# Patient Record
Sex: Male | Born: 1975 | Race: White | Hispanic: No | Smoking: Former smoker
Health system: Southern US, Community
[De-identification: ages and names within clinical notes are randomized; demographics above are authoritative.]

## PROBLEM LIST (undated history)

## (undated) DIAGNOSIS — F411 Generalized anxiety disorder: Secondary | ICD-10-CM

## (undated) DIAGNOSIS — E785 Hyperlipidemia, unspecified: Secondary | ICD-10-CM

## (undated) DIAGNOSIS — F329 Major depressive disorder, single episode, unspecified: Secondary | ICD-10-CM

## (undated) DIAGNOSIS — M1990 Unspecified osteoarthritis, unspecified site: Secondary | ICD-10-CM

## (undated) DIAGNOSIS — I10 Essential (primary) hypertension: Secondary | ICD-10-CM

## (undated) DIAGNOSIS — G4730 Sleep apnea, unspecified: Secondary | ICD-10-CM

## (undated) DIAGNOSIS — K648 Other hemorrhoids: Secondary | ICD-10-CM

## (undated) HISTORY — DX: Major depressive disorder, single episode, unspecified: F32.9

## (undated) HISTORY — PX: INGUINAL HERNIA REPAIR: SUR1180

## (undated) HISTORY — DX: Hyperlipidemia, unspecified: E78.5

## (undated) HISTORY — DX: Other hemorrhoids: K64.8

## (undated) HISTORY — PX: UVULOPALATOPHARYNGOPLASTY (UPPP)/TONSILLECTOMY/SEPTOPLASTY: SHX6164

## (undated) HISTORY — DX: Essential (primary) hypertension: I10

## (undated) HISTORY — PX: NASAL SEPTUM SURGERY: SHX37

## (undated) HISTORY — DX: Sleep apnea, unspecified: G47.30

## (undated) HISTORY — PX: TONSILLECTOMY: SUR1361

## (undated) HISTORY — DX: Unspecified osteoarthritis, unspecified site: M19.90

## (undated) HISTORY — DX: Generalized anxiety disorder: F41.1

## (undated) HISTORY — PX: OTHER SURGICAL HISTORY: SHX169

---

## 2002-11-08 ENCOUNTER — Ambulatory Visit (HOSPITAL_BASED_OUTPATIENT_CLINIC_OR_DEPARTMENT_OTHER): Admission: RE | Admit: 2002-11-08 | Discharge: 2002-11-08 | Payer: Self-pay | Admitting: General Surgery

## 2005-03-15 ENCOUNTER — Ambulatory Visit: Payer: Self-pay | Admitting: Internal Medicine

## 2005-04-15 ENCOUNTER — Ambulatory Visit: Payer: Self-pay | Admitting: Internal Medicine

## 2005-05-20 ENCOUNTER — Ambulatory Visit: Payer: Self-pay | Admitting: Internal Medicine

## 2005-07-24 ENCOUNTER — Emergency Department (HOSPITAL_COMMUNITY): Admission: EM | Admit: 2005-07-24 | Discharge: 2005-07-25 | Payer: Self-pay | Admitting: Emergency Medicine

## 2005-08-01 ENCOUNTER — Ambulatory Visit: Payer: Self-pay | Admitting: Internal Medicine

## 2006-03-04 ENCOUNTER — Emergency Department (HOSPITAL_COMMUNITY): Admission: EM | Admit: 2006-03-04 | Discharge: 2006-03-04 | Payer: Self-pay | Admitting: Emergency Medicine

## 2006-03-13 ENCOUNTER — Ambulatory Visit: Payer: Self-pay | Admitting: Internal Medicine

## 2006-03-20 ENCOUNTER — Ambulatory Visit: Payer: Self-pay | Admitting: Internal Medicine

## 2006-04-07 ENCOUNTER — Ambulatory Visit: Payer: Self-pay | Admitting: Internal Medicine

## 2006-04-21 ENCOUNTER — Ambulatory Visit: Payer: Self-pay | Admitting: Internal Medicine

## 2006-04-28 ENCOUNTER — Ambulatory Visit: Payer: Self-pay | Admitting: Internal Medicine

## 2006-11-06 ENCOUNTER — Ambulatory Visit: Payer: Self-pay | Admitting: Internal Medicine

## 2006-11-08 ENCOUNTER — Ambulatory Visit: Payer: Self-pay | Admitting: Internal Medicine

## 2007-01-23 ENCOUNTER — Ambulatory Visit: Payer: Self-pay | Admitting: Internal Medicine

## 2007-01-23 LAB — CONVERTED CEMR LAB
ALT: 47 units/L — ABNORMAL HIGH (ref 0–40)
AST: 27 units/L (ref 0–37)
Albumin: 4.2 g/dL (ref 3.5–5.2)
Alkaline Phosphatase: 56 units/L (ref 39–117)
BUN: 11 mg/dL (ref 6–23)
Basophils Absolute: 0 10*3/uL (ref 0.0–0.1)
Basophils Relative: 0.1 % (ref 0.0–1.0)
Bilirubin, Direct: 0.1 mg/dL (ref 0.0–0.3)
CO2: 29 meq/L (ref 19–32)
Calcium: 9.6 mg/dL (ref 8.4–10.5)
Chloride: 102 meq/L (ref 96–112)
Cholesterol: 244 mg/dL (ref 0–200)
Creatinine, Ser: 1.1 mg/dL (ref 0.4–1.5)
Direct LDL: 190 mg/dL
Eosinophils Absolute: 0.2 10*3/uL (ref 0.0–0.6)
Eosinophils Relative: 3.7 % (ref 0.0–5.0)
GFR calc Af Amer: 102 mL/min
GFR calc non Af Amer: 84 mL/min
Glucose, Bld: 77 mg/dL (ref 70–99)
HCT: 48.4 % (ref 39.0–52.0)
HDL: 32.7 mg/dL — ABNORMAL LOW (ref >39.0)
Hemoglobin: 16.8 g/dL (ref 13.0–17.0)
Lymphocytes Relative: 33.5 % (ref 12.0–46.0)
MCHC: 34.8 g/dL (ref 30.0–36.0)
MCV: 91.6 fL (ref 78.0–100.0)
Monocytes Absolute: 0.5 10*3/uL (ref 0.2–0.7)
Monocytes Relative: 9.5 % (ref 3.0–11.0)
Neutro Abs: 2.8 10*3/uL (ref 1.4–7.7)
Neutrophils Relative %: 53.2 % (ref 43.0–77.0)
Platelets: 209 10*3/uL (ref 150–400)
Potassium: 3.9 meq/L (ref 3.5–5.1)
RBC: 5.28 M/uL (ref 4.22–5.81)
RDW: 12.7 % (ref 11.5–14.6)
Sodium: 139 meq/L (ref 135–145)
TSH: 2.96 microintl units/mL (ref 0.35–5.50)
Total Bilirubin: 1.1 mg/dL (ref 0.3–1.2)
Total CHOL/HDL Ratio: 7.5
Total Protein: 7.9 g/dL (ref 6.0–8.3)
Triglycerides: 159 mg/dL — ABNORMAL HIGH (ref 0–149)
VLDL: 32 mg/dL (ref 0–40)
WBC: 5.2 10*3/uL (ref 4.5–10.5)

## 2007-02-05 ENCOUNTER — Ambulatory Visit: Payer: Self-pay | Admitting: Internal Medicine

## 2007-02-09 ENCOUNTER — Ambulatory Visit: Payer: Self-pay | Admitting: Licensed Clinical Social Worker

## 2007-02-13 ENCOUNTER — Ambulatory Visit: Payer: Self-pay | Admitting: Licensed Clinical Social Worker

## 2007-03-01 ENCOUNTER — Ambulatory Visit: Payer: Self-pay | Admitting: Licensed Clinical Social Worker

## 2007-03-29 ENCOUNTER — Ambulatory Visit: Payer: Self-pay | Admitting: Licensed Clinical Social Worker

## 2007-07-19 DIAGNOSIS — F329 Major depressive disorder, single episode, unspecified: Secondary | ICD-10-CM

## 2007-07-19 DIAGNOSIS — F3289 Other specified depressive episodes: Secondary | ICD-10-CM

## 2007-07-19 DIAGNOSIS — F411 Generalized anxiety disorder: Secondary | ICD-10-CM

## 2007-07-19 DIAGNOSIS — E785 Hyperlipidemia, unspecified: Secondary | ICD-10-CM | POA: Insufficient documentation

## 2007-07-19 DIAGNOSIS — M545 Low back pain, unspecified: Secondary | ICD-10-CM | POA: Insufficient documentation

## 2007-07-19 DIAGNOSIS — I10 Essential (primary) hypertension: Secondary | ICD-10-CM | POA: Insufficient documentation

## 2007-07-19 DIAGNOSIS — F419 Anxiety disorder, unspecified: Secondary | ICD-10-CM | POA: Insufficient documentation

## 2007-07-19 HISTORY — DX: Generalized anxiety disorder: F41.1

## 2007-07-19 HISTORY — DX: Other specified depressive episodes: F32.89

## 2007-07-19 HISTORY — DX: Essential (primary) hypertension: I10

## 2007-07-19 HISTORY — DX: Hyperlipidemia, unspecified: E78.5

## 2007-07-19 HISTORY — DX: Major depressive disorder, single episode, unspecified: F32.9

## 2007-10-03 ENCOUNTER — Ambulatory Visit: Payer: Self-pay | Admitting: Internal Medicine

## 2007-10-03 DIAGNOSIS — R319 Hematuria, unspecified: Secondary | ICD-10-CM | POA: Insufficient documentation

## 2007-10-03 LAB — CONVERTED CEMR LAB
Bilirubin Urine: NEGATIVE
Glucose, Urine, Semiquant: NEGATIVE
Ketones, urine, test strip: NEGATIVE
Nitrite: NEGATIVE
Protein, U semiquant: NEGATIVE
Specific Gravity, Urine: 1.02
Urobilinogen, UA: 0.2
WBC Urine, dipstick: NEGATIVE
pH: 7

## 2007-10-04 ENCOUNTER — Encounter: Payer: Self-pay | Admitting: Internal Medicine

## 2007-10-05 ENCOUNTER — Telehealth: Payer: Self-pay | Admitting: Internal Medicine

## 2007-10-10 ENCOUNTER — Telehealth: Payer: Self-pay | Admitting: Internal Medicine

## 2007-10-15 ENCOUNTER — Ambulatory Visit: Payer: Self-pay | Admitting: Internal Medicine

## 2007-10-15 DIAGNOSIS — H5310 Unspecified subjective visual disturbances: Secondary | ICD-10-CM | POA: Insufficient documentation

## 2007-10-16 ENCOUNTER — Telehealth: Payer: Self-pay | Admitting: Internal Medicine

## 2007-10-16 ENCOUNTER — Encounter: Payer: Self-pay | Admitting: Internal Medicine

## 2007-10-19 ENCOUNTER — Telehealth: Payer: Self-pay | Admitting: Internal Medicine

## 2007-10-24 ENCOUNTER — Ambulatory Visit: Payer: Self-pay | Admitting: Ophthalmology

## 2007-10-24 ENCOUNTER — Encounter: Payer: Self-pay | Admitting: Internal Medicine

## 2007-10-31 ENCOUNTER — Telehealth: Payer: Self-pay | Admitting: Internal Medicine

## 2007-11-09 ENCOUNTER — Encounter: Payer: Self-pay | Admitting: Internal Medicine

## 2007-11-16 ENCOUNTER — Emergency Department (HOSPITAL_COMMUNITY): Admission: EM | Admit: 2007-11-16 | Discharge: 2007-11-16 | Payer: Self-pay | Admitting: Emergency Medicine

## 2007-11-16 ENCOUNTER — Telehealth: Payer: Self-pay | Admitting: Internal Medicine

## 2007-11-23 ENCOUNTER — Ambulatory Visit (HOSPITAL_BASED_OUTPATIENT_CLINIC_OR_DEPARTMENT_OTHER): Admission: RE | Admit: 2007-11-23 | Discharge: 2007-11-23 | Payer: Self-pay | Admitting: Otolaryngology

## 2007-12-02 ENCOUNTER — Ambulatory Visit: Payer: Self-pay | Admitting: Internal Medicine

## 2007-12-07 ENCOUNTER — Telehealth: Payer: Self-pay | Admitting: Internal Medicine

## 2007-12-11 ENCOUNTER — Telehealth: Payer: Self-pay | Admitting: Internal Medicine

## 2007-12-13 ENCOUNTER — Telehealth: Payer: Self-pay | Admitting: Internal Medicine

## 2007-12-24 ENCOUNTER — Ambulatory Visit: Payer: Self-pay | Admitting: Internal Medicine

## 2007-12-24 ENCOUNTER — Ambulatory Visit: Payer: Self-pay | Admitting: Licensed Clinical Social Worker

## 2007-12-26 LAB — CONVERTED CEMR LAB
Basophils Absolute: 0 10*3/uL (ref 0.0–0.1)
Basophils Relative: 0.4 % (ref 0.0–1.0)
Eosinophils Absolute: 0.1 10*3/uL (ref 0.0–0.6)
Eosinophils Relative: 2.4 % (ref 0.0–5.0)
HCT: 49.6 % (ref 39.0–52.0)
Hemoglobin: 17.8 g/dL — ABNORMAL HIGH (ref 13.0–17.0)
Lymphocytes Relative: 33.1 % (ref 12.0–46.0)
MCHC: 35.9 g/dL (ref 30.0–36.0)
MCV: 90.6 fL (ref 78.0–100.0)
Monocytes Absolute: 0.5 10*3/uL (ref 0.2–0.7)
Monocytes Relative: 8.4 % (ref 3.0–11.0)
Neutro Abs: 3.2 10*3/uL (ref 1.4–7.7)
Neutrophils Relative %: 55.7 % (ref 43.0–77.0)
Platelets: 183 10*3/uL (ref 150–400)
RBC: 5.48 M/uL (ref 4.22–5.81)
RDW: 12.3 % (ref 11.5–14.6)
TSH: 3.96 microintl units/mL (ref 0.35–5.50)
WBC: 5.7 10*3/uL (ref 4.5–10.5)

## 2008-01-07 ENCOUNTER — Telehealth: Payer: Self-pay | Admitting: Internal Medicine

## 2008-01-10 ENCOUNTER — Telehealth: Payer: Self-pay | Admitting: Internal Medicine

## 2008-02-05 ENCOUNTER — Telehealth: Payer: Self-pay | Admitting: Internal Medicine

## 2008-02-18 ENCOUNTER — Ambulatory Visit: Payer: Self-pay | Admitting: Internal Medicine

## 2008-02-18 LAB — CONVERTED CEMR LAB
ALT: 50 units/L (ref 0–53)
AST: 23 units/L (ref 0–37)
Albumin: 4 g/dL (ref 3.5–5.2)
Alkaline Phosphatase: 63 units/L (ref 39–117)
BUN: 9 mg/dL (ref 6–23)
Basophils Absolute: 0 10*3/uL (ref 0.0–0.1)
Basophils Relative: 0.5 % (ref 0.0–1.0)
Bilirubin Urine: NEGATIVE
Bilirubin, Direct: 0.2 mg/dL (ref 0.0–0.3)
Blood in Urine, dipstick: NEGATIVE
CO2: 32 meq/L (ref 19–32)
Calcium: 9.3 mg/dL (ref 8.4–10.5)
Chloride: 103 meq/L (ref 96–112)
Cholesterol: 209 mg/dL (ref 0–200)
Creatinine, Ser: 1 mg/dL (ref 0.4–1.5)
Direct LDL: 117.9 mg/dL
Eosinophils Absolute: 0.2 10*3/uL (ref 0.0–0.6)
Eosinophils Relative: 3.5 % (ref 0.0–5.0)
GFR calc Af Amer: 113 mL/min
GFR calc non Af Amer: 93 mL/min
Glucose, Bld: 86 mg/dL (ref 70–99)
Glucose, Urine, Semiquant: NEGATIVE
HCT: 50.1 % (ref 39.0–52.0)
HDL: 26.4 mg/dL — ABNORMAL LOW (ref >39.0)
Hemoglobin: 16.8 g/dL (ref 13.0–17.0)
Ketones, urine, test strip: NEGATIVE
Lymphocytes Relative: 32.9 % (ref 12.0–46.0)
MCHC: 33.6 g/dL (ref 30.0–36.0)
MCV: 91.2 fL (ref 78.0–100.0)
Monocytes Absolute: 0.5 10*3/uL (ref 0.2–0.7)
Monocytes Relative: 9.2 % (ref 3.0–11.0)
Neutro Abs: 2.9 10*3/uL (ref 1.4–7.7)
Neutrophils Relative %: 53.9 % (ref 43.0–77.0)
Nitrite: NEGATIVE
Platelets: 169 10*3/uL (ref 150–400)
Potassium: 4.5 meq/L (ref 3.5–5.1)
Protein, U semiquant: NEGATIVE
RBC: 5.49 M/uL (ref 4.22–5.81)
RDW: 12.1 % (ref 11.5–14.6)
Sodium: 139 meq/L (ref 135–145)
Specific Gravity, Urine: 1.02
TSH: 3.9 microintl units/mL (ref 0.35–5.50)
Total Bilirubin: 0.9 mg/dL (ref 0.3–1.2)
Total CHOL/HDL Ratio: 7.9
Total Protein: 7.1 g/dL (ref 6.0–8.3)
Triglycerides: 262 mg/dL (ref 0–149)
Urobilinogen, UA: 0.2
VLDL: 52 mg/dL — ABNORMAL HIGH (ref 0–40)
WBC Urine, dipstick: NEGATIVE
WBC: 5.3 10*3/uL (ref 4.5–10.5)
pH: 7

## 2008-02-20 LAB — CONVERTED CEMR LAB

## 2008-03-03 ENCOUNTER — Ambulatory Visit: Payer: Self-pay | Admitting: Internal Medicine

## 2008-04-25 ENCOUNTER — Telehealth: Payer: Self-pay | Admitting: Internal Medicine

## 2008-06-19 ENCOUNTER — Telehealth: Payer: Self-pay | Admitting: Internal Medicine

## 2008-10-06 ENCOUNTER — Encounter: Payer: Self-pay | Admitting: Internal Medicine

## 2008-10-15 ENCOUNTER — Ambulatory Visit (HOSPITAL_COMMUNITY): Admission: RE | Admit: 2008-10-15 | Discharge: 2008-10-17 | Payer: Self-pay | Admitting: Otolaryngology

## 2008-10-15 ENCOUNTER — Encounter (INDEPENDENT_AMBULATORY_CARE_PROVIDER_SITE_OTHER): Payer: Self-pay | Admitting: Otolaryngology

## 2008-10-24 ENCOUNTER — Encounter: Payer: Self-pay | Admitting: Internal Medicine

## 2008-11-10 ENCOUNTER — Encounter: Payer: Self-pay | Admitting: Internal Medicine

## 2008-11-25 ENCOUNTER — Telehealth: Payer: Self-pay | Admitting: Internal Medicine

## 2008-12-01 ENCOUNTER — Ambulatory Visit: Payer: Self-pay | Admitting: Family Medicine

## 2008-12-01 DIAGNOSIS — K648 Other hemorrhoids: Secondary | ICD-10-CM | POA: Insufficient documentation

## 2008-12-01 HISTORY — DX: Other hemorrhoids: K64.8

## 2008-12-08 ENCOUNTER — Telehealth: Payer: Self-pay | Admitting: *Deleted

## 2008-12-24 ENCOUNTER — Telehealth: Payer: Self-pay | Admitting: Internal Medicine

## 2008-12-26 ENCOUNTER — Ambulatory Visit: Payer: Self-pay | Admitting: Internal Medicine

## 2008-12-26 DIAGNOSIS — J019 Acute sinusitis, unspecified: Secondary | ICD-10-CM | POA: Insufficient documentation

## 2009-01-05 ENCOUNTER — Telehealth: Payer: Self-pay | Admitting: Internal Medicine

## 2009-02-06 ENCOUNTER — Ambulatory Visit: Payer: Self-pay | Admitting: Family Medicine

## 2009-02-11 ENCOUNTER — Telehealth: Payer: Self-pay | Admitting: Speech Pathology

## 2009-02-24 ENCOUNTER — Telehealth: Payer: Self-pay | Admitting: Internal Medicine

## 2009-04-22 ENCOUNTER — Ambulatory Visit: Payer: Self-pay | Admitting: Family Medicine

## 2009-05-25 ENCOUNTER — Telehealth: Payer: Self-pay | Admitting: Family Medicine

## 2009-06-11 ENCOUNTER — Emergency Department (HOSPITAL_COMMUNITY): Admission: EM | Admit: 2009-06-11 | Discharge: 2009-06-11 | Payer: Self-pay | Admitting: Emergency Medicine

## 2010-05-07 ENCOUNTER — Ambulatory Visit: Payer: Self-pay | Admitting: Family Medicine

## 2010-05-07 DIAGNOSIS — A088 Other specified intestinal infections: Secondary | ICD-10-CM | POA: Insufficient documentation

## 2010-05-10 ENCOUNTER — Telehealth: Payer: Self-pay | Admitting: Internal Medicine

## 2010-06-23 ENCOUNTER — Telehealth: Payer: Self-pay | Admitting: Internal Medicine

## 2011-01-04 NOTE — Consult Note (Signed)
Summary: [facility]  [facility]   Imported By: Maryln Gottron 11/26/2008 15:09:52  _____________________________________________________________________  External Attachment:    Type:   Image     Comment:   External Document

## 2011-01-04 NOTE — Letter (Signed)
Summary: [facility]  [facility]   Imported By: Maryln Gottron 11/06/2008 15:19:57  _____________________________________________________________________  External Attachment:    Type:   Image     Comment:   External Document

## 2011-01-04 NOTE — Progress Notes (Signed)
Summary: hemorrhoids?  Phone Note Call from Patient   Caller: Patient Call For: Dr. Cato Mulligan Summary of Call: Pt feels an "itchy mass" around rectum.  Can only come on Monday.   Scheduled with Dr. Tawanna Cooler, but advised he can use OTC steroid cream or witch hazel. Initial call taken by: Lynann Beaver CMA,  November 25, 2008 11:33 AM

## 2011-01-04 NOTE — Progress Notes (Signed)
Summary: refill valium  Phone Note Call from Patient Call back at Home Phone [phone]   Caller: Patient Call For: dr Mata Rowen Summary of Call: pt would like 4 valium for panic attacks. [facility] [phone] Initial call taken by: Heron Sabins,  December 07, 2007 3:11 PM  Follow-up for Phone Call        Rx called in. Patient notified.  Follow-up by: Gladis Riffle, RN,  December 07, 2007 3:23 PM      Prescriptions: VALIUM 5 MG  TABS (DIAZEPAM) as needed for panic attacks  #4 x 0   Entered by:   Gladis Riffle, RN   Authorized by:   Birdie Sons MD   Signed by:   Gladis Riffle, RN on 12/07/2007   Method used:   Telephoned to ...       [facility]  [id number]       [address]       [place], [place]  [address]       Ph: [phone]       Fax: [phone]   RxID:   [id number]

## 2011-01-04 NOTE — Progress Notes (Signed)
Summary: Pt worried about dialation of eyes, requesting MRI  Phone Note Call from Patient Call back at Home Phone [phone]   Caller: Patient Call For: Brian Rowe Summary of Call: Pt called worried about his sx.  He does have an appt with [facility] on [address] tomorrow at 9:15 am.  Pt is experiencing both eyes being dialated today, the light is really bothering him and he has a headache, nausea, some dizziness, working inside and the light coming in the windows hurting his eyes.  Pt states "I think this is more serious and requesting an MRI, worried about an aneurysm". [facility] [address] Initial call taken by: [name] LPN,  October 16, 2007 1:17 PM  Follow-up for Phone Call        he has ov with ophthalmologist - will wait for that appt.  Follow-up by: Birdie Sons MD,  October 16, 2007 1:50 PM  Additional Follow-up for Phone Call Additional follow up Details #1::        Left msg on [phone] with Dr Cato Mulligan message. Additional Follow-up by: [name] LPN,  October 16, 2007 3:42 PM

## 2011-01-04 NOTE — Progress Notes (Signed)
Summary: hemiroid problem  Phone Note Call from Patient Call back at [phone]   Caller: pt live Call For: [facility] Summary of Call: patient is having alot of trouble with hemriods and he would like to be referral to a surgon. Initial call taken by: Celine Ahr,  February 11, 2009 3:34 PM  Follow-up for Phone Call        ok Follow-up by: Birdie Sons MD,  February 12, 2009 11:21 AM  Additional Follow-up for Phone Call Additional follow up Details #1::        Referral done by Cyris.Marland KitchenMarland KitchenSee order. Additional Follow-up by: Barnie Mort,  February 16, 2009 10:34 AM

## 2011-01-04 NOTE — Assessment & Plan Note (Signed)
Summary: laceration between eyes/dm   Vital Signs:  Patient Profile:   35 Years Old Male Height:     75 inches Weight:      279 pounds Temp:     98.3 degrees F oral BP sitting:   120 / 70  (left arm) Cuff size:   regular  Vitals Entered By: [name] LPN (February 06, 1609 10:38 AM)                 Chief Complaint:  Laceration 3pm yesterday at work, last Td 2005, and has not used any ice or pain relief med.  History of Present Illness: Patient is seen as a work in with laceration of the face. This occurred around 3 PM yesterday.  Turned his head around and hit a motor. There was no loss of consciousness.  He had some bleeding initially which eventually stopped with pressure. Last tetanus booster reportedly 2005.  He has not had any headaches or any other complaints today.  Had a little bit of serosanguineous drainage last night and early this morning. He cleaned the wound with soap and water I yesterday. No signs of secondary infection.    Current Allergies: No known allergies      Review of Systems      See HPI       Denies any headaches or dizziness. No fevers or chills.   Physical Exam  General:     Well-developed,well-nourished,in no acute distress; alert,appropriate and cooperative throughout examination Eyes:     No corneal or conjunctival inflammation noted. EOMI. Perrla. Funduscopic exam benign, without hemorrhages, exudates or papilledema. Vision grossly normal. Nose:     External nasal examination shows no deformity or inflammation. Nasal mucosa are pink and moist without lesions or exudates. Skin:     Patient has a laceration which is slightly less than half centimeter across the bridge of the nose. This is non-gaping and minimal serous drainage but no purulent drainage. There is no evidence for significant edema.    Impression & Recommendations:  Problem # 1:  LACERATION, FACE (ICD-873.40)  Laceration of the face which is approximately 20 hours old. Given duration we would not recommend suturing at this point. We applied a couple Steri-Strips for approximation. Continue topical antibiotic daily for the next 3-4 days and follow  promptly if secondary infection. Tetanus is up to date.  Complete Medication List: 1)  Ambien 10 Mg Tabs (Zolpidem tartrate) .... Take 1 tablet by mouth at bedtime 2)  Anusol-hc 25 Mg Supp (Hydrocortisone acetate) .Marland Kitchen.. 1 rectally at bedtime 3)  Zoloft 100 Mg Tabs (Sertraline hcl) .... At hs   Patient Instructions: 1)  Followup promptly if signs of infection such as redness, swelling, increased pain, or pus-like drainage noted.  Use topical antibiotic daily for the next 3-4 days. Leave Steri-Strips in place until they start to fall off in a few days. 2)  Please schedule a follow-up appointment as needed.

## 2011-01-04 NOTE — Progress Notes (Signed)
Summary: nervous  Phone Note Call from Patient Call back at Home Phone [phone]   Caller: patient triage message Call For: Mackynzie Woolford Summary of Call: Woke up this am with a bad feeling of nervousness.  Could this be his thyroid.  Would like to have that checked.  Initial call taken by: Roselle Locus,  December 11, 2007 11:16 AM  Follow-up for Phone Call        Called pt and he reports he was being seen by Darrol Poke and then referred to another male counselor who she sees about once a month, mostly for med management.  He takes Prozac and uses Ambien at HS.  Last night and into this am he had a bad nightmare and since then he has had an upset stomach.  Pt wondering if the meds are the side effects.  Pt questioning Thrroid testing?  Last OV Nov 08 with Dr Cato Mulligan Follow-up by: [name] LPN,  December 11, 2007 12:51 PM  Additional Follow-up for Phone Call Additional follow up Details #1::        schedule non urgent ov---we can check labs at that time if appropriate Additional Follow-up by: Birdie Sons MD,  December 11, 2007 1:50 PM    Additional Follow-up for Phone Call Additional follow up Details #2::    Pt schedule for OV on 1/19 09. Follow-up by: [name] LPN,  December 11, 2007 2:34 PM       Appended Document: nervous patient called in this am and would like an rx for [place] at [facility] on [address] [phone]

## 2011-01-04 NOTE — Progress Notes (Signed)
Summary: ?panic attacks?  Phone Note Call from Patient   Caller: Patient Call For: Dr. Cato Mulligan Summary of Call: Pt. calls in stating he has some chest tightness this am and his left arm feels "cold".  No SOB, neck pain, etc.  Offered ER evaluation or appt this pm with Dr. Cato Mulligan, but pt is at work and does not want to miss any time.  He will call back if he changes his mind.  He also admits to panic attack, and not sure if this is related to his symptoms. Initial call taken by: Lynann Beaver CMA,  November 16, 2007 8:51 AM

## 2011-01-04 NOTE — Assessment & Plan Note (Signed)
Summary: nightmares, anxious, is it his meds?nn   Vital Signs:  Patient Profile:   35 Years Old Male Weight:      264 pounds Temp:     98.9 degrees F oral Pulse rate:   67 / minute Pulse rhythm:   regular Resp:     14 per minute                 Chief Complaint:  anxiety/nightmares.  History of Present Illness: Mood disorder---started prozac---initially with increase anxiety, nightmares. Told to add lamictal---seemed to make him worse. He feels chronic anxiety---always seems to happen at work. He can have panic attacks---but he is able to "talk myself out of them". Pt concerned with thyroid----says his father has thyroid abnormality  Current Allergies: No known allergies   Past Medical History:    Reviewed history from 07/19/2007 and no changes required:       Inguinal Hernia       Anxiety       Depression       Hyperlipidemia       Hypertension       Low back pain  Past Surgical History:    Reviewed history from 07/19/2007 and no changes required:       Foot Surgery - Club Foot       R hydrocelectomy       R inguinal hernia repair   Social History:    Reviewed history from 07/19/2007 and no changes required:       Occupation:       Single       Current Smoker    Review of Systems       no other complaints in a complete ROS      Impression & Recommendations:  Problem # 1:  ANXIETY (ICD-300.00) he has multiple somatic complaints he wants to avoid medications and i agree---refer for psychotherapy he understands he should have regular follow-up with psychiatry and psychotherapy.  My preference is that he avoid benzodiazepines.  He has been given a prescription of Valium by a psychiatrist.  I told him to use this minimally.  If he needs medications he should consider long-term antidepressants or and anxiety medications. His updated medication list for this problem includes:    Valium 5 Mg Tabs (Diazepam) .Marland Kitchen... As needed for panic attacks  Orders:  Venipuncture ([address]) TLB-TSH (Thyroid Stimulating Hormone) (84443-TSH) TLB-CBC Platelet - w/Differential (85025-CBCD)   Complete Medication List: 1)  Valium 5 Mg Tabs (Diazepam) .... As needed for panic attacks  Other Orders: Sedimentation Rate, non-automated (60454)     ] Laboratory Results   Blood Tests     SED rate: 1  Comments: ...................................................................Milica Zimonjic  December 24, 2007 4:25 PM

## 2011-01-04 NOTE — Assessment & Plan Note (Signed)
Summary: hemorrhoids/dm   Vital Signs:  Patient Profile:   35 Years Old Male Height:     75 inches Weight:      273 pounds Temp:     98.5 degrees F oral BP sitting:   128 / 88  (left arm) Cuff size:   large  Vitals Entered By: Kern Reap CMA (December 01, 2008 2:31 PM)                 Chief Complaint:  hemorroids.  History of Present Illness: Brian Rowe is a 35 year old male, who comes in today for evaluation of a hemorrhoid.  About a week ago he noticed a lump inside his rectum.  It's been painless and not bleeding.  He's never had a problem like this in the past.  He states he has no history of constipation.  He is overweight 273 pounds    Updated Prior Medication List: AMBIEN 10 MG  TABS (ZOLPIDEM TARTRATE) Take 1 tablet by mouth at bedtime  Current Allergies: No known allergies   Past Medical History:    Reviewed history from 07/19/2007 and no changes required:       Inguinal Hernia       Anxiety       Depression       Hyperlipidemia       Hypertension       Low back pain   Social History:    Reviewed history from 03/03/2008 and no changes required:       Occupation:--air craft maintenance       Single       Current Smoker---has quit--2008       Former Smoker       Alcohol use-yes       sexually active-multiple partners---has not used condoms    Review of Systems      See HPI   Physical Exam  General:     Well-developed,well-nourished,in no acute distress; alert,appropriate and cooperative throughout examination Rectal:     externally the rectum appears normal.  There is a internal hemorrhoid at the 5 o'clock position.    Impression & Recommendations:  Problem # 1:  HEMORRHOIDS, INTERNAL (ICD-455.0) Assessment: New  Complete Medication List: 1)  Ambien 10 Mg Tabs (Zolpidem tartrate) .... Take 1 tablet by mouth at bedtime 2)  Anusol-hc 25 Mg Supp (Hydrocortisone acetate) .Marland Kitchen.. 1 rectally at bedtime   Patient Instructions:  1)  take milk of Magnesia or prune juice daily.  Soak in hot tub water for 10 minutes prior to bedtime.  Insert a medicated suppository in the rectum prior to bedtime.  If after 12 nights of doing this the hemorrhoid does not heal.  Call   Prescriptions: ANUSOL-HC 25 MG SUPP (HYDROCORTISONE ACETATE) 1 rectally at bedtime  #12 x 1   Entered and Authorized by:   Roderick Pee MD   Signed by:   Roderick Pee MD on 12/01/2008   Method used:   Electronically to        [facility] [address] [facility] [phone]* (retail)       [address] [place], [place]  [address]       Ph: [phone]       Fax: [phone]   RxID:   [id number] ANUSOL-HC 25 MG SUPP (HYDROCORTISONE ACETATE) 1 rectally at bedtime  #12 x 1   Entered and Authorized by:   Roderick Pee MD   Signed by:   Eugenio Hoes  Conlee Sliter MD on 12/01/2008   Method used:   Electronically to        [facility] [phone]* (retail)       [address]       [place], [place]  [address]       Ph: [phone]       Fax: [phone]   RxID:   [id number]  ]

## 2011-01-04 NOTE — Progress Notes (Signed)
Summary: Sour stomach, hemmoroid update  Phone Note Call from Patient   Caller: Patient Call For: Birdie Sons MD Summary of Call: Pt saw Dr Tawanna Cooler 12/22 for hemmoroid symptoms, ws given Anusol rectally at HS X 12 nights after sitz bath.  Pt reports he developed a sour stomach with the [place] and prune juice and now thinks he may have picked up a bug.  Recommended clear liquid diet until diarrhea subsides gradually adding solid foods.  Call back when suppositorys are completed to report how he is feeling. Initial call taken by: [name] LPN,  December 08, 2008 12:03 PM

## 2011-01-04 NOTE — Progress Notes (Signed)
Summary: Flexeril  Phone Note Call from Patient   Summary of Call[facility] Pt. would like to refill Flexeril for "teeth grinding and insomnia". [phone] Initial call taken by: Lynann Beaver CMA,  May 10, 2010 3:01 PM  Follow-up for Phone Call        short-term treatment would be okay.  Flexeril 10 mg 1/2-1 p.o. q.h.s. p.r.n. #10/no refills. Follow-up by: Birdie Sons MD,  May 11, 2010 7:49 AM    New/Updated Medications: FLEXERIL 10 MG TABS (CYCLOBENZAPRINE HCL) 1/2-1 by mouth q hs as needed Prescriptions: FLEXERIL 10 MG TABS (CYCLOBENZAPRINE HCL) 1/2-1 by mouth q hs as needed  #10 x 0   Entered by:   Lynann Beaver CMA   Authorized by:   Birdie Sons MD   Signed by:   Lynann Beaver CMA on 05/11/2010   Method used:   [facility]ically to        [facility]  [address] [phone]* (retail)       [address]       [place], [place]  [address]       Ph: [phone] or [phone]       Fax: [phone]   RxID:   [id number]  Pt. notified.

## 2011-01-04 NOTE — Progress Notes (Signed)
Summary: taking last Doxycycline  Phone Note Call from Patient   Caller: Patient Summary of Call: Pt is feeling a little better, but is on his last Doxycycline, and wondered if he should be asymptomatic.   Advised him to give the ab a while longer, and if symptoms get worse, to call.  Otherwise, he should continue to improve. Initial call taken by: Lynann Beaver CMA,  January 05, 2009 12:40 PM  Follow-up for Phone Call        agree Follow-up by: Gordy Savers  MD,  January 05, 2009 2:55 PM

## 2011-01-04 NOTE — Assessment & Plan Note (Signed)
Summary: SWORDS PT STOMACH VIRUS SEV DAYS/STOOL COLOR CHANGES/CAN'T KI...   Vital Signs:  Patient profile:   35 year old male Weight:      288 pounds Temp:     98.6 degrees F oral BP sitting:   120 / 90  (left arm) Cuff size:   large  Vitals Entered By: [name] LPN (May 07, 1609 9:26 AM) CC: Stomach virus, black stool   History of Present Illness: onset Tues vomiting but none since. Today first episode diarrhea.  Nonbloody diarrhea. Appetite decresed today.  No abd pain but some mild cramping.  No ill contacts. No fever.  no recent antibiotics.   No travels.  Allergies (verified): No Known Drug Allergies  Past History:  Past Medical History: Last updated: 07/19/2007 Inguinal Hernia Anxiety Depression Hyperlipidemia Hypertension Low back pain PMH reviewed for relevance  Review of Systems  The patient denies anorexia, fever, weight loss, abdominal pain, melena, hematochezia, and severe indigestion/heartburn.    Physical Exam  General:  Well-developed,well-nourished,in no acute distress; alert,appropriate and cooperative throughout examination Ears:  External ear exam shows no significant lesions or deformities.  Otoscopic examination reveals clear canals, tympanic membranes are intact bilaterally without bulging, retraction, inflammation or discharge. Hearing is grossly normal bilaterally. Mouth:  Oral mucosa and oropharynx without lesions or exudates.  Teeth in good repair. Neck:  No deformities, masses, or tenderness noted. Lungs:  Normal respiratory effort, chest expands symmetrically. Lungs are clear to auscultation, no crackles or wheezes. Heart:  normal rate and regular rhythm.   Abdomen:  soft, non-tender, normal bowel sounds, no distention, and no masses.     Impression & Recommendations:  Problem # 1:  GASTROENTERITIS, VIRAL (ICD-008.8) no evidence for dehydration.  Complete Medication List: 1)  Ambien 10 Mg Tabs (Zolpidem tartrate) .... Take 1  tablet by mouth at bedtime 2)  Anusol-hc 25 Mg Supp (Hydrocortisone acetate) .Marland Kitchen.. 1 rectally at bedtime 3)  Zoloft 100 Mg Tabs (Sertraline hcl) .... At hs 4)  Effexor Xr 75 Mg Xr24h-cap (Venlafaxine hcl) .... 1/2 tab as needed anxiety  Patient Instructions: 1)  The main problem with gastroentereritis is dehydration. Drink plenty of fluids and take solids as you feel better. If you are unable to keep anything down and/or you show signs of dehydration( dry cracked lips, lack of tears, not urinating, very sleepy) , call our office.  2)  Consider immodium for diarrhea.

## 2011-01-04 NOTE — Progress Notes (Signed)
Summary: different referra;  Phone Note Call from Patient   Caller: Patient Call For: Birdie Sons MD Summary of Call: Pt leaves a message that he cannot go to [facility] and wants Dr. Cato Mulligan to refer him to a different group. [phone] Initial call taken by: Lynann Beaver CMA,  February 24, 2009 2:56 PM  Follow-up for Phone Call        there are no other surgical groups in [place].  He can call and make an appointment with the surgeon of choice at [facility] or [facility] Follow-up by: Birdie Sons MD,  February 25, 2009 7:02 AM  Additional Follow-up for Phone Call Additional follow up Details #1::        Pt given Dr. Marliss Coots recommendations. Additional Follow-up by: Lynann Beaver CMA,  February 25, 2009 9:13 AM

## 2011-01-04 NOTE — Progress Notes (Signed)
Summary: LMTCB 5/26, Poison Oak X 1 week  Phone Note Call from Patient Call back at [employer] [phone]   Caller: Patient Call For: Herbert Marken Summary of Call: Pt requesting help with poison oak, rash all over his arms X 1 week.  OTC lotions not helping.  Pt has been doing alot of yard work. [facility] [address] Initial call taken by: [name] LPN,  Apr 25, 2008 1:54 PM  Follow-up for Phone Call        how about Saturday clinic? Follow-up by: Birdie Sons MD,  Apr 25, 2008 5:02 PM  Additional Follow-up for Phone Call Additional follow up Details #1::        LMTCB [name] LPN  Apr 29, 2008 8:10 AM  [facility] Lynann Beaver CMA  Apr 30, 2008 8:36 AM Additional Follow-up by: Lynann Beaver CMA,  Apr 30, 2008 8:36 AM         Appended Document: [facility] 5/26, Poison Oak X 1 week Spoke to pt and he is better regarding his poison ivy symptoms.

## 2011-01-04 NOTE — Progress Notes (Signed)
Summary: LMTCB for appt today  Phone Note Call from Patient   Caller: Patient Call For: Dr. Cato Mulligan Summary of Call: Pt had a fall yesterday on the ice and is having pain in neck and back.  LMTCB for appt. Initial call taken by: Lynann Beaver CMA,  February 05, 2008 11:22 AM         Appended Document: LMTCB for appt today Pt. is having pain in throat area and abdominal pain from the fall yesterday.  He landed on his buttocks, but pain is in the front of his neck and abdomen.  Cannot get off work to come for an office visit. [facility] [address])  Appended Document: LMTCB for appt today Pt does NOT want to come in.  Feels like he is having muscle spasms.  Can you call in RX?  Appended Document: LMTCB for appt today Per Dr. Cato Mulligan........Marland KitchenCall in Flexeril 10 mg. one two times a day #20.  Will send to pharmacy.  See refills.

## 2011-01-04 NOTE — Assessment & Plan Note (Signed)
Summary: Cough, sore throat, Dr Cato Mulligan pt/nn   Vital Signs:  Patient Profile:   35 Years Old Male Height:     75 inches Weight:      262 pounds Temp:     98.7 degrees F oral BP sitting:   116 / 78  (left arm) Cuff size:   large  Vitals Entered By: Raechel Ache, RN (December 26, 2008 11:06 AM)                 Chief Complaint:  C/o sinus cong and dizzy & productive cough x 2 weeks- greenish/brown phlegm.Brian Rowe  History of Present Illness: 35 year old patient status post ENT surgery, approximately 2 months ago.  For the past two weeks.  She has had increasingly severe sinus congestion, drainage, and some minimal sore throat.  He is now expectorating green brown sputum and has some associated sinus congestion, discomfort, and dizziness.  There is been no documented fever    Current Allergies: No known allergies   Past Medical History:    Reviewed history from 07/19/2007 and no changes required:       Inguinal Hernia       Anxiety       Depression       Hyperlipidemia       Hypertension       Low back pain  Past Surgical History:    Reviewed history from 07/19/2007 and no changes required:       Foot Surgery - Club Foot       R hydrocelectomy       R inguinal hernia repair       status post uvulopharyngoplasty     Review of Systems       The patient complains of hoarseness and prolonged cough.  The patient denies anorexia, fever, weight loss, weight gain, vision loss, decreased hearing, chest pain, syncope, dyspnea on exertion, peripheral edema, headaches, hemoptysis, abdominal pain, melena, hematochezia, severe indigestion/heartburn, hematuria, incontinence, genital sores, muscle weakness, suspicious skin lesions, transient blindness, difficulty walking, depression, unusual weight change, abnormal bleeding, enlarged lymph nodes, angioedema, breast masses, and testicular masses.     Physical Exam  General:      Well-developed,well-nourished,in no acute distress; alert,appropriate and cooperative throughout examination Head:     Normocephalic and atraumatic without obvious abnormalities. No apparent alopecia or balding. Eyes:     No corneal or conjunctival inflammation noted. EOMI. Perrla. Funduscopic exam benign, without hemorrhages, exudates or papilledema. Vision grossly normal. Ears:     External ear exam shows no significant lesions or deformities.  Otoscopic examination reveals clear canals, tympanic membranes are intact bilaterally without bulging, retraction, inflammation or discharge. Hearing is grossly normal bilaterally. Nose:     External nasal examination shows no deformity or inflammation. Nasal mucosa are pink and moist without lesions or exudates. Mouth:     status post uvulopharyngoplasty; mild erythema;  Neck:     No deformities, masses, or tenderness noted. Lungs:     Normal respiratory effort, chest expands symmetrically. Lungs are clear to auscultation, no crackles or wheezes. Heart:     Normal rate and regular rhythm. S1 and S2 normal without gallop, murmur, click, rub or other extra sounds.    Impression & Recommendations:  Problem # 1:  SINUSITIS- ACUTE-NOS (ICD-461.9)  His updated medication list for this problem includes:    Doxycycline Hyclate 100 Mg Tabs (Doxycycline hyclate) ..... One twice daily Will treat with Mucinex D twice daily  Complete Medication List: 1)  Ambien 10 Mg Tabs (Zolpidem tartrate) .... Take 1 tablet by mouth at bedtime 2)  Anusol-hc 25 Mg Supp (Hydrocortisone acetate) .Brian Rowe.. 1 rectally at bedtime 3)  Zoloft 100 Mg Tabs (Sertraline hcl) .... At hs 4)  Doxycycline Hyclate 100 Mg Tabs (Doxycycline hyclate) .... One twice daily   Patient Instructions: 1)  Mucinex D twice daily 2)  Take your antibiotic as prescribed until ALL of it is gone, but stop if you develop a rash or swelling and contact our office as soon as possible.    Prescriptions: DOXYCYCLINE HYCLATE 100 MG TABS (DOXYCYCLINE HYCLATE) one twice daily  #20 x 0   Entered and Authorized by:   Gordy Savers  MD   Signed by:   Gordy Savers  MD on 12/26/2008   Method used:   Print then Give to Patient   RxID:   [id number]

## 2011-01-04 NOTE — Progress Notes (Signed)
Summary: sinus?  Phone Note Call from Patient   Summary of Call: Coughing up green mucus,  Does not feel ill.  Head congested.  No fever or chills. Taking Mucinex. [facility]) [phone] Wants Rx Initial call taken by: Lynann Beaver CMA,  December 24, 2008 9:48 AM  Follow-up for Phone Call        Pt called again, he is also experiencing soreness and reddness to right side of throat, again denies fever.  Pt had tonsilectomy, uvula and part of soft palate removed in November.  This is the first time he has had not felt good since his surgery.  Pt aware he may not hear from our office until tomorrow.  Pt sleeping well, takes Ambien and Zoloft per Rosebud Poles Follow-up by: [name] LPN,  December 24, 2008 4:22 PM  Additional Follow-up for Phone Call Additional follow up Details #1::        office visit with available physician.  Pt has no time to make an appt this week. Lynann Beaver CMA  December 25, 2008 10:09 AM Additional Follow-up by: Birdie Sons MD,  December 25, 2008 9:06 AM    New/Updated Medications: ZOLOFT 100 MG TABS (SERTRALINE HCL) at [facility]

## 2011-01-04 NOTE — Progress Notes (Signed)
Summary: would like to try Cymbalta, pt sees a Psychotherapist  Phone Note Call from Patient Call back at Home Phone [phone]   Caller: patient live Call For: Theresa Wedel Summary of Call: Would like to try Cymbalta [facility]  Initial call taken by: Roselle Locus,  January 10, 2008 11:04 AM  Follow-up for Phone Call        OV 1/19 anxiety/nightmares. Note reports he tried Prozac, made things worse, added Lamictal, still no improvement. Rx Valium as needed panic attacks on 1/19  Additional Follow-up for Phone Call Additional follow up Details #1::        i believe he is seeing psychiatrist----these meds should be adjusted by psych Additional Follow-up by: Birdie Sons MD,  January 10, 2008 3:56 PM    Additional Follow-up for Phone Call Additional follow up Details #2::    Called pt, he reports he see's a Psychotherapist, they have discussed meds and she has told him she is not able to prescribe. ..................................................................Marland KitchenSid Falcon LPN  January 10, 2008 5:38 PM schedule non-urgent office visit Follow-up by: Birdie Sons MD,  January 11, 2008 12:46 PM

## 2011-01-04 NOTE — Progress Notes (Signed)
Summary: MRI Wed/OK to wait?  Phone Note Call from Patient Call back at [facility] Phone [phone]   Caller: Patient Call For: DR Brenleigh Collet Reason for Call: Acute Illness, Talk to Nurse Summary of Call: PT THINKS THAT HE MAY HAVE MENINGITIS. PLEASE CALL ASAP. Initial call taken by: Warnell Forester,  October 19, 2007 9:22 AM  Follow-up for Phone Call        Stiff muscle back of neck/dull, headache 4-5 days/dull, dizzy, confusion, diff awakening. Sensitivity to light.  No temp or seizures. Saw opth: no detached retina.  Ordered MRI.  To have on Wed.  Wondering if he is okay to wait until then.  Urged he go to [facility] or ER if symptoms worsen or change meanwhile.   Follow-up by: Rudy Jew, RN,  October 19, 2007 9:46 AM  Additional Follow-up for Phone Call Additional follow up Details #1::        ok to wait from my point of view Additional Follow-up by: Birdie Sons MD,  October 19, 2007 9:54 AM    Additional Follow-up for Phone Call Additional follow up Details #2::    Patient advised per Dr. Cato Mulligan: From my point ov view okay to wait. Follow-up by: Rudy Jew, RN,  October 19, 2007 10:25 AM

## 2011-01-04 NOTE — Assessment & Plan Note (Signed)
Summary: BLOOD IN URINE/PS   Vital Signs:  Patient Profile:   35 Years Old Male Weight:      284 pounds Temp:     98.2 degrees F oral Pulse rhythm:   regular BP sitting:   152 / 82  Vitals Entered By: Lynann Beaver CMA (October 03, 2007 4:17 PM)                 Chief Complaint:  hematuria and low  back pain x 4 days.  Acute Visit History:      The patient complains of genitourinary symptoms.  The genitourinary symptoms began yesterday.  He notes a history of hematuria.  He denies abdominal pain, chills, dysuria, fever, flank pain, frequency, hesitancy, incontinence, nausea, urgency, urinary retention, or vomiting.  The patient has no history of pyelonephritis, kidney stones, a renal anomaly, renal disease, diabetes, sexually transmitted disease, or immunosuppression.  Antibiotics have not been used within the last 4 weeks.  He has not had 3 or more urinary tract infections in the last 12 months.  Comments: yesterday with two drops of blood in urine at the end of urination. .        Current Allergies: No known allergies   Past Medical History:    Reviewed history from 07/19/2007 and no changes required:       Inguinal Hernia       Anxiety       Depression       Hyperlipidemia       Hypertension       Low back pain  Past Surgical History:    Reviewed history from 07/19/2007 and no changes required:       Foot Surgery - Club Foot       R hydrocelectomy       R inguinal hernia repair   Social History:    Reviewed history from 07/19/2007 and no changes required:       Occupation:       Single       Current Smoker    Review of Systems       no other complaints in a complete ROS    Physical Exam  General:     Well-developed,well-nourished,in no acute distress; alert,appropriate and cooperative throughout examination Head:     Normocephalic and atraumatic without obvious abnormalities. No apparent alopecia or balding. Neck:      No deformities, masses, or tenderness noted. Heart:     Normal rate and regular rhythm. S1 and S2 normal without gallop, murmur, click, rub or other extra sounds. Abdomen:     no cva or suprapubic tenderness    Impression & Recommendations:  Problem # 1:  HEMATURIA (ICD-599.7) by history reviewed ua if sxs recur he will need further eval Orders: T-Urine Culture (16109)   Other Orders: UA Dipstick w/o Micro (60454)      ] Laboratory Results   Urine Tests    Routine Urinalysis   Color: yellow Appearance: Clear Glucose: negative   (Normal Range: Negative) Bilirubin: negative   (Normal Range: Negative) Ketone: negative   (Normal Range: Negative) Spec. Gravity: 1.020   (Normal Range: 1.003-1.035) Blood: trace-intact   (Normal Range: Negative) pH: 7.0   (Normal Range: 5.0-8.0) Protein: negative   (Normal Range: Negative) Urobilinogen: 0.2   (Normal Range: 0-1) Nitrite: negative   (Normal Range: Negative) Leukocyte Esterace: negative   (Normal Range: Negative)    Comments: ...................................................................Milica Zimonjic  October 03, 2007 4:28 PM

## 2011-01-04 NOTE — Progress Notes (Signed)
Summary: side effect?  Phone Note Call from Patient Call back at [phone]   Caller: pt vm triage Call For: [name] Summary of Call: has rx for Zoloft and Ambien from  [facility].  Has been short of breath and seems easily exerted.  Can this be a side effect of these meds  Has been on the meds for 4 weeks  Initial call taken by: Roselle Locus,  June 19, 2008 1:59 PM  Follow-up for Phone Call        [facility] with additional information [name] LPN  June 19, 2008 2:07 PM  Pt states he takes Zoloft 10 mg, (the lowest dose) and Ambien 15 mg, (pt guessing at dosing).  Pt reports his anxiety is improved, so he would like to begin being more active, when he does just a slight amount of exercise seems to get SOB  Follow-up by: [name] LPN,  June 19, 2008 2:22 PM  Additional Follow-up for Phone Call Additional follow up Details #1::        I did not prescribe med. unaware of SOB as side effect---schedule OV if pt. concerned---next week Additional Follow-up by: Birdie Sons MD,  June 20, 2008 8:09 AM    Additional Follow-up for Phone Call Additional follow up Details #2::    Pt. will watch his symptoms, and call for appt if he feels it is necessary. Follow-up by: Lynann Beaver CMA,  June 20, 2008 8:20 AM

## 2011-01-04 NOTE — Progress Notes (Signed)
Summary: new rx  Phone Note Call from Patient Call back at Home Phone [phone]   Caller: Patient Call For: dr Flossie Wexler Summary of Call: pt would like rx for Brian Rowe [address] [phone] Initial call taken by: Heron Sabins,  December 13, 2007 9:55 AM  Follow-up for Phone Call        per Dr Cato Mulligan no refill.Patient notified.   would like something for sleep, states  inability is from anxiety. Anything else he can try?..................................................................Marland KitchenGladis Riffle, RN  December 13, 2007 12:52 PM   Additional Follow-up for Phone Call Additional follow up Details #1::        melatonin 1 tablet at night. can buy at any pharmacy Additional Follow-up by: Birdie Sons MD,  December 14, 2007 4:54 PM    Pt. notified.

## 2011-01-04 NOTE — Consult Note (Signed)
Summary: [facility]  [facility]   Imported By: Maryln Gottron 02/29/2008 15:36:19  _____________________________________________________________________  External Attachment:    Type:   Image     Comment:   External Document

## 2011-01-04 NOTE — Progress Notes (Signed)
Summary: Neck Pain  Phone Note Call from Patient Call back at Home Phone [phone]   Caller: Patient Reason for Call: Acute Illness Summary of Call: Neck pain.  When I turn my head both sides are really sore.  Would like to speak with nurse. Initial call taken by: Trixie Dredge,  June 23, 2010 10:49 AM  Follow-up for Phone Call        will try ice and ibuprofen Follow-up by: Willy Eddy, LPN,  June 23, 2010 11:14 AM

## 2011-01-04 NOTE — Letter (Signed)
Summary: Eye Exam/[facility]  [facility]   Imported By: Maryln Gottron 12/17/2009 13:55:04  _____________________________________________________________________  External Attachment:    Type:   Image     Comment:   External Document

## 2011-01-06 ENCOUNTER — Encounter: Payer: Self-pay | Admitting: Internal Medicine

## 2011-01-06 ENCOUNTER — Ambulatory Visit (INDEPENDENT_AMBULATORY_CARE_PROVIDER_SITE_OTHER): Payer: 59 | Admitting: Family Medicine

## 2011-01-06 ENCOUNTER — Encounter: Payer: Self-pay | Admitting: Family Medicine

## 2011-01-06 VITALS — BP 120/80 | HR 78 | Temp 98.3°F | Wt 231.0 lb

## 2011-01-06 DIAGNOSIS — H9201 Otalgia, right ear: Secondary | ICD-10-CM

## 2011-01-06 DIAGNOSIS — H9209 Otalgia, unspecified ear: Secondary | ICD-10-CM

## 2011-01-06 NOTE — Patient Instructions (Signed)
Keep ear dry and continue with antibiotic drops. Follow up promptly for any fever or hearing changes.

## 2011-01-06 NOTE — Progress Notes (Signed)
  Subjective:    Patient ID: Brian Rowe, male    DOB: August 21, 1976, 35 y.o.   MRN: [id number]  Otalgia  There is pain in the right ear. This is a new problem. The current episode started in the past 7 days. The problem occurs constantly. The problem has been gradually improving. There has been no fever. The pain is at a severity of 3/10. The pain is mild. Associated symptoms include drainage and ear discharge. Pertinent negatives include no abdominal pain, coughing, headaches, hearing loss, neck pain or rash. He has tried ear drops for the symptoms. The treatment provided mild relief. There is no history of a chronic ear infection, hearing loss or a tympanostomy tube.      Review of Systems  Constitutional: Negative.   HENT: Positive for ear pain and ear discharge. Negative for hearing loss and neck pain.   Respiratory: Negative for cough.   Gastrointestinal: Negative for abdominal pain.  Skin: Negative for rash.  Neurological: Negative for headaches.       Objective:   Physical Exam  Constitutional: He appears well-developed and well-nourished.  HENT:  Head: Normocephalic and atraumatic.  Left Ear: External ear normal.  Nose: Nose normal.  Mouth/Throat: Oropharynx is clear and moist.       Right external canal reveals some blood inferior portion of canal   eardrum is only partially visualized. No obvious trauma to eardrum. He has moderate cerumen in the canal  Eyes: Pupils are equal, round, and reactive to light.  Neck: Normal range of motion. Neck supple. No thyromegaly present.  Cardiovascular: Normal rate and regular rhythm.   Pulmonary/Chest: Breath sounds normal.  Lymphadenopathy:    He has no cervical adenopathy.          Assessment & Plan:  #1 trauma right external canal. No obvious trauma to tympanic membrane Continue Ciprodex ear drops and keep ear dry

## 2011-03-03 ENCOUNTER — Telehealth: Payer: Self-pay | Admitting: *Deleted

## 2011-03-03 NOTE — Telephone Encounter (Signed)
Pt has a cough and green mucus.  I told pt to try Mucinex D for a week and call back if not any better.  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  He will go buy a mask and try the mucinex D and call back if no better

## 2011-04-19 NOTE — Procedures (Signed)
NAME:  Brian Rowe, Brian Rowe               ACCOUNT NO.:  [id number]   MEDICAL RECORD NO.:  [id number]          PATIENT TYPE:  OUT   LOCATION:  SLEEP CENTER                 FACILITY:  [facility]   PHYSICIAN:  Clinton D. Maple Hudson, MD, FCCP, FACPDATE OF BIRTH:  18-Jul-1977   DATE OF STUDY:  11/23/2007                            NOCTURNAL POLYSOMNOGRAM   REFERRING PHYSICIAN:  Zola Button T. Lazarus Salines, M.D.   INDICATION FOR STUDY:  Hypersomnia with sleep apnea.   EPWORTH SLEEPINESS SCORE:  7/24, BMI 34, weight 272 pounds, height 75  inches, neck 19.8 inches.   HOME MEDICATIONS:  Charted and reviewed.   SLEEP ARCHITECTURE:  Split study protocol.  During the diagnostic phase  total sleep time was 179 minutes with sleep efficiency 87%.  Stage I was  8%, stage II 58%, stage III 22%, REM 11% of total sleep time.  Sleep  latency 19 minutes.  REM latency 124 minutes.  Awake after sleep onset 7  minutes.  Arousal index 13.4.  No bedtime medication was taken.   RESPIRATORY DATA:  Split study protocol.  Apnea/hypopnea index (AHI)  15.7 obstructive events per hour indicating mild to moderate obstructive  sleep apnea/hypopnea syndrome before CPAP.  There were 18 obstructive  apneas and 28 hypopneas before CPAP.  Events were not positional.  CPAP  was then titrated with 11-CWP, AHI zero per hour.  A medium Mirage  Quattro mask was used with heated humidifier.   OXYGEN DATA:  Very loud snoring before CPAP with oxygen desaturation to  a nadir of 78%.  After CPAP control, mean oxygen saturation was 96% on  room air.   CARDIAC DATA:  Sinus rhythm with PVCs.   MOVEMENT-PARASOMNIA:  No significant movement disturbance, bathroom x1.   IMPRESSIONS-RECOMMENDATIONS:  1. Mild to moderate obstructive sleep apnea/hypopnea syndrome,      apnea/hypopnea index 15.7 per hour with non-positional events, very      loud snoring and oxygen desaturation to a nadir of 77%.  2. Successful CPAP titration to 11-CWP, apnea/hypopnea index zero  per      hour.  A medium Mirage Quattro full face mask was used with heated      humidifier.      Clinton D. Maple Hudson, MD, [facility], FACP  Diplomate, Biomedical engineer of Sleep Medicine  Electronically Signed     CDY/MEDQ  D:  12/02/2007 10:27:36  T:  12/02/2007 22:14:18  Job:  [id number]

## 2011-04-19 NOTE — Op Note (Signed)
NAME:  Brian Rowe, Brian Rowe               ACCOUNT NO.:  [id number]   MEDICAL RECORD NO.:  [id number]          PATIENT TYPE:  OIB   LOCATION:  3313                         FACILITY:  [facility]   PHYSICIAN:  Zola Button T. Lazarus Salines, M.D. DATE OF BIRTH:  18-May-1976   DATE OF PROCEDURE:  10/13/2008  DATE OF DISCHARGE:                               OPERATIVE REPORT   PREOPERATIVE DIAGNOSES:  1. Nasal septal deviation.  2. Hypertrophic inferior turbinates.  3. Hypertrophic tonsils with chronic tonsillitis.  4. Obstructive sleep apnea.   POSTOPERATIVE DIAGNOSES:  1. Nasal septal deviation.  2. Hypertrophic inferior turbinates.  3. Hypertrophic tonsils with chronic tonsillitis.  4. Obstructive sleep apnea.   PROCEDURE PERFORMED:  1. Nasal septoplasty.  2. Bilateral SMR inferior turbinates.  3. Tonsillectomy, UPPP, adenoid ablation.   SURGEON:  Gloris Manchester. Wolicki, MD   ANESTHESIA:  General orotracheal.   BLOOD LOSS:  10 mL.   COMPLICATIONS:  None.   FINDINGS:  An overall slightly leftward deviated external nose.  Internally, relatively severe rightward septal deviation with  obstruction.  Hypertrophic inferior turbinates, left greater than right.  In the oral cavity, 2- 3+ protruding tonsils with a long thick but  otherwise normal soft palate.  Small residual adenoids in the  nasopharynx.   PROCEDURE:  With the patient in a comfortable supine position, having  received preoperative Afrin spray, general orotracheal anesthesia was  induced without difficulty.  At an appropriate level, a saline-moistened  throat pack was placed.  Nasal vibrissae were trimmed.  Cocaine  crystals, 200 mg total were applied on cotton carriers to the anterior  ethmoid and sphenopalatine ganglion regions on both sides.  Cocaine  solution, 160 mg total was applied on 1.5 x 3 inch cottonoids to both  sides of the nasal septum.  Xylocaine 1%  with 1:100,000 epinephrine, 10  mL total was infiltrated into the anterior floor  of the nose, into the  nasal spine region, into the membranous columella on both sides, and  finally into the submucoperichondrial plane of the septum on both sides.  Several minutes were allowed for this to take effect.  A sterile  preparation and draping of the midface was accomplished.   The materials were removed from the nose and observed to be intact and  correct in number.  The findings were as described above.  A left-sided  hemitransfixion incision was elected and sharply executed and carried  down to the caudal edge of the quadrangular cartilage, and then  continued onto a floor incision.  A small right-sided floor incision was  executed.  Floor tunnels was elevated on both sides and brought medially  to the vomer and maxillary crest.  The submucoperichondrial plane of the  left septum was elevated carried up to the dorsum of the nose back onto  the perpendicular plate and then brought down and communicated with the  floor tunnel.  There was a small linear rent at the inferior posterior,  left septal flap of no consequence.   The chondro-ethmoid junction was identified and opened with a Cottle  elevator and the opposite sub-mucoperiosteal plane  of the perpendicular  plate was raised using the Cottle elevator.  Upon isolating the  perpendicular plate, the superior portion was lysed with an open Leane Para-  Middleton forceps.  The dissection was carried more inferiorly and the  midportion of the posterior septum was rocked free and delivered with a  closed Jansen-Middleton forceps.  Additional bony spicules on the vomer  and then the posterior perpendicular plate were rocked free and  delivered.  At this point, the dissection was brought forward.  There  was a long posterior cartilaginous tail of the quadrangular cartilage  was submucosally resected.  The inferior portion of the quadrangular  cartilage approximately 2 mm was sharply incised and submucosally  dissected.  The  maxillary crest posterior to the quadrangular cartilage  was lowered using a Takahashi forceps and delivered.  At this point, the  septum was freely mobile and was able to swing into the midline with a  good straight configuration.  This was secured to the nasal spine with a  figure-of-eight 4-0 PDS suture.  Septal tunnel was suctioned free.  The  mucosal incisions were closed with interrupted 4-0 chromic suture.   Just prior to completing the septoplasty, the inferior turbinates were  infiltrated with 1% Xylocaine with 1:100,000 epinephrine, 6 mL total.  Beginning on the right side, the anterior hood of the inferior turbinate  was sharply incised just behind the nasal valve.  The medial mucosa of  the turbinate was incised in an anterior upsloping fashion and a  laterally based flap was developed.  The turbinate was infractured.  Using angled turbinate scissors, the turbinate bone and lateral mucosa  were resected in a posterior downsloping fashion, taking virtually all  the anterior pole, leaving virtually all the posterior pole.  Additional  bony spicules were carefully dissected submucosally and removed.  The  flap was laid back down, the turbinate was outfractured and the right  side was completed.  The left side was done in identical fashion.   After completing both turbinate reductions, a 0.030 reinforced Silastic  splint was fashioned and placed on each side of the septum and secured  there to with a 3-0 Ethilon stitch.  A double thickness Telfa pack  impregnated with bacitracin ointment was placed against the inferior  turbinate on each side.  Note that the cut mucosal edges of the  turbinates had been suction coagulated at the close of the turbinate  resection procedure.  The Telfa packs were held with hemostat and a  shortened 7.0-mm nasal trumpet was then placed on each side of the nose  to allow some postoperative airway.  This completed the nasal portion of  the procedure.   Hemostasis was observed.   The table was flattened and then turned 90 degrees and then placed in  Trendelenburg.  Pharynx was suctioned clear and the throat pack was  removed.  Taking care to protect lips, teeth, and endotracheal tube, the  Crowe-Davis mouth gag was introduced, expanded for visualization, and  suspended from the Mayo stand in the standard fashion.  The findings  were as described above.  The preoperative palatal tattoo mark was  identified.  Xylocaine 1% with 1:100,000 epinephrine, 6 mL total was  infiltrated into the peritonsillar planes for intraoperative hemostasis.  Several minutes were allowed for this to take effect.   Beginning at the [place] ink tattoo point, a chevron-shaped incision was  coagulated on the skin surface and then this same incision was directed  down onto the  anterior tonsillar pillars on both sides.  Beginning on  the right side, the tonsil was grasped with an Allis forceps and  retracted medially.  The mucosa overlying the tonsil was incised down to  the capsule.  Using the cautery tip as a blunt dissector, lysing fibrous  bands, and coagulating crossing vessels, the tonsil was dissected from  its fossa beginning at the inferior pole and carrying upward.  It was  left attached to this soft palate.  The left side was done in identical  fashion.   Upon approaching the soft palate, the incision was carried through the  uvular muscle and then angled inferiorly to allow a posterior mucosal  flap.  The specimen was resected on block with portions of soft palate,  the entire uvula and both tonsils.  This was passed from the field for  gross interpretation.  A small amount of cautery rendered the wound  hemostatic.   The stump of the uvula was rolled forward and approximated to the  remaining palatal muscle with a 4-0 Vicryl stitch.  The posterior mucosa  was rolled forward and beginning in the midline.  This was  reapproximated to the mucosal cut  edges and carried laterally.  There  was some tethering from the posterior tonsillar pillars and a 45 degree  angled 1.5 cm incision was made through the superior posterior pillars  to allow the nasopharyngeal surface of the soft palate to come forward  for reapproximation and the posterior pillar to come laterally to close  the tonsil fossae.  This closure was carried out using multiple  interrupted 4-0 Vicryl sutures.  Hemostasis was observed.  Good  configuration to the soft palate was noted.   With mirror examination, there was some residual adenoid tissue.  This  was controlled with suction cautery.   An orogastric tube was passed through the stomach and a tiny amount of  clear secretions was evacuated.  The tube was removed.   At this point, the palate retractor was relaxed for several minutes.  Upon re-expansion, hemostasis was persistent.  At this point, the  procedure was completed.  Palate retractor was relaxed and removed.  The  dental status was intact.  The patient was returned to Anesthesia,  awakened, extubated, and transferred to recovery in stable condition.   COMMENT:  A 35 year old white male with a history of poor nasal  breathing secondary to septal deviation and hypertrophic turbinates,  also history of chronic tonsillitis and a history of documented  obstructive sleep apnea were the several indications for today's  procedure.  Anticipated routine postoperative recovery with attention to  ice, elevation, analgesia, antibiosis, and observation for bleeding,  emesis, airway compromise, or any synergy between his pain medications  and his anesthetic.  We will observe him 23 hours in the step-down unit  and then decide on his disposition following that.      Gloris Manchester. Lazarus Salines, M.D.  Electronically Signed     KTW/MEDQ  D:  10/15/2008  T:  10/15/2008  Job:  [id number]   cc:   Valetta Mole. Swords, MD
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
## 2011-04-22 NOTE — Op Note (Signed)
NAME:  Brian Rowe, Brian Rowe                         ACCOUNT NO.:  [id number]   MEDICAL RECORD NO.:  [id number]                   PATIENT TYPE:  AMB   LOCATION:  DSC                                  FACILITY:  [facility]   PHYSICIAN:  Gita Kudo, M.D.              DATE OF BIRTH:  [birth year]   DATE OF PROCEDURE:  DATE OF DISCHARGE:                                 OPERATIVE REPORT   OPERATIVE PROCEDURE:  1. Repair of right inguinal hernia - indirect.  2. Right hydrocelectomy.   SURGEON:  Dr. Maryagnes Amos.   ANESTHESIA:  General endotracheal.   PREOPERATIVE DIAGNOSIS:  Right inguinal hernia.   POSTOPERATIVE DIAGNOSES:  1. Right inguinal hernia, large, indirect.  2. Hydrocele, right testis.   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  On PE, a reducible hernia is found and his  testicle is slightly large, consistent with a small hydrocele.   OPERATIVE FINDINGS:  The patient had a large indirect sac.  He had a medium  sized hydrocele.  Nerves and cord structures identified and not injured.   OPERATIVE PROCEDURE:  Under satisfactory general endotracheal anesthesia,  having received 1.0 gram Ancef preoperatively, the patient's abdomen and  genitalia were prepped and draped in the standard fashion.  Transverse lower  abdominal incision made and carried down to and through the external ring  and external oblique.  Bleeders coagulated or tied with 3-0 Vicryl.  Self-  retaining retractors gave excellent exposure, and the cord and its contents  were mobilized with the Penrose drain.  The indirect sac was identified and  dissected.  The testis was brought up into the field and the hydrocele sac  identified, opened, and excess sac cut away with cautery and the edges  cauterized and no attempt at closure made.  The testis was returned to the  scrotum and then the sac carefully dissected high to the internal ring.  It  was twisted, secured  with a 0 Prolene suture ligature and excess excised.  Then, the floor was opened medially from the pubis to the internal ring.  Finger dissection was used to develop the properitoneal space and extended  under the inferior epigastric vessels.  The properitoneal contents were held  away with the moistened sponge, and 1/3 of the 3 x 6 inch piece of Prolene  mesh was tailored into an oval and anchored at Cooper's ligament with a 0  Prolene suture.  The mesh was then unfolded laterally and inferiorly.  The  moistened gauze was removed and the mesh unfolded superiorly and medially  over the properitoneal contents, and then the floor of the canal closed over  the mesh taking intermittent bites of the mesh with a running #0 Prolene  suture, and at the end, when tied, the internal ring was snug and the ends  of the suture left long.  The  remainder of the mesh was tailored into an  oval and a slit made to go around the cord structures.  It was anchored at  the internal ring with the previously placed suture and then tacked  inferomedially to the tissues beyond the closure of the floor, near the  pubis, medially with a single suture to the internal oblique, inferiorly to  the inguinal ligament with two sutures, and then the towels of the mesh  brought around the cord and sutured to each other.  The mesh lay in good  position and the wound lavaged with saline and infiltrated with 30 cc of  0.5% Marcaine for postoperative analgesia.  Then, the  wound was closed in layers with running 2-0 Vicryl for the external oblique,  interrupted 2-0 Vicryl for the deep fascia, 3-0 Vicryl for the subcu and  Steri-Strips for the skin.  Sterile absorbant dressings were then applied  and the patient went to the recovery room from the operating room in good  condition, without complication.                                               Gita Kudo, M.D.    MRL/MEDQ  D:  11/08/2002  T:  11/09/2002  Job:   [id number]   cc:   Valetta Mole. Swords, M.D. [facility]  [address] [place]  [place] [address]  Fax: 1

## 2011-09-06 LAB — CBC
HCT: 49.7
Hemoglobin: 17
MCHC: 34.2
MCV: 92
Platelets: 172
RBC: 5.4
RDW: 12.8
WBC: 5.6

## 2011-09-12 LAB — CBC
HCT: 46.7
Hemoglobin: 16.5
MCHC: 35.4
MCV: 88.6
Platelets: 175
RBC: 5.27
RDW: 13.3
WBC: 4.6

## 2011-09-12 LAB — POCT CARDIAC MARKERS
CKMB, poc: <1 — ABNORMAL LOW
CKMB, poc: <1 — ABNORMAL LOW
Myoglobin, poc: 81.3
Myoglobin, poc: 87.2
Operator id: 272551
Operator id: 284141
Troponin i, poc: <0.05
Troponin i, poc: <0.05

## 2011-09-12 LAB — I-STAT 8, (EC8 V) (CONVERTED LAB)
BUN: 11
Bicarbonate: 26.7 — ABNORMAL HIGH
Chloride: 104
Glucose, Bld: 87
HCT: 51
Hemoglobin: 17.3 — ABNORMAL HIGH
Operator id: 284141
Potassium: 3.9
Sodium: 139
TCO2: 28
pCO2, Ven: 47.7
pH, Ven: 7.355 — ABNORMAL HIGH

## 2011-09-12 LAB — POCT I-STAT CREATININE
Creatinine, Ser: 1.4
Operator id: 284141

## 2012-07-20 ENCOUNTER — Telehealth: Payer: Self-pay | Admitting: Internal Medicine

## 2012-07-20 NOTE — Telephone Encounter (Signed)
Pt is sch to see Dr Artist Pais on Monday 315pm

## 2012-07-20 NOTE — Telephone Encounter (Signed)
Caller: [name]/Patient; Patient Name: Brian Rowe; PCP: Birdie Sons; Best Callback Phone Number: [phone],  Call regarding upper back pain between the shoulder blades.  Onset 2 months.  No known injury. Treats discomfort with Tylenol and heat therapy.  All emergent symptoms ruled out per Back Symptoms protocol with exception to ' Mild to moderate pain in back with normal activity and not responding to 72 hours of home care'.  See Provider  in 72 hrs. No appts available for today.  Instructed pt to expect a call from scheduling.   Thank you.

## 2012-07-20 NOTE — Telephone Encounter (Signed)
Brian Rowe, can this wait until next week? If so, can you please route this to any scheduler and ask them to set the pt up with someone in the time frame you recommend? Thank you.

## 2012-07-20 NOTE — Telephone Encounter (Signed)
Pain has been going for 2 months so ok to see pt next week with any provider.  Dr Cato Mulligan does not have any openings

## 2012-07-20 NOTE — Telephone Encounter (Signed)
lmom for pt to call back and set up appt

## 2012-07-20 NOTE — Telephone Encounter (Signed)
Pt is sch for Monday 215pm

## 2012-07-23 ENCOUNTER — Ambulatory Visit (INDEPENDENT_AMBULATORY_CARE_PROVIDER_SITE_OTHER): Payer: BC Managed Care – PPO | Admitting: Internal Medicine

## 2012-07-23 VITALS — BP 116/74 | Temp 99.0°F | Wt 273.0 lb

## 2012-07-23 DIAGNOSIS — M546 Pain in thoracic spine: Secondary | ICD-10-CM | POA: Insufficient documentation

## 2012-07-23 MED ORDER — MELOXICAM 15 MG PO TABS: 15.0000 mg | ORAL_TABLET | Freq: Every day | ORAL | Status: DC

## 2012-07-23 MED ORDER — METHOCARBAMOL 500 MG PO TABS: 500.0000 mg | ORAL_TABLET | Freq: Three times a day (TID) | ORAL | Status: AC | PRN

## 2012-07-23 NOTE — Patient Instructions (Addendum)
Please call our office if your symptoms do not improve or gets worse.  

## 2012-07-23 NOTE — Assessment & Plan Note (Signed)
36 year old white male with upper thoracic back pain. Symptoms secondary to somatic dysfunction. Utilized myofascial release techniques to thoracic and cervical spine. Also used HVLA techniques to thoracic and cervical spine. Patient tolerated well. No complications.  Use meloxicam 15 mg once daily and Robaxin 500 mg 3 times a day as needed.  Patient advised to call office if symptoms persist or worsen.

## 2012-07-23 NOTE — Progress Notes (Signed)
  Subjective:    Patient ID: Brian Rowe, male    DOB: August 24, 1976, 36 y.o.   MRN: [id number]  HPI  36 year old white male with history of anxiety disorder complains of upper thoracic pain for 2 months. Patient denies any specific injury,. 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 Patient describes nagging aching sensation between his shoulder blades more on his left side.  He has history of low back pain.  Patient requests refill on meloxicam 15 mg.  Patient previously seen by [facility] orthopedics for right  heel pain.  Patient diagnosed with heel spur. He is deferring surgical management.   Review of Systems Negative for dysphasia  Past Medical History  Diagnosis Date  . HYPERLIPIDEMIA 07/19/2007  . ANXIETY 07/19/2007  . DEPRESSION 07/19/2007  . VISUAL IMPAIRMENT 10/15/2007  . HYPERTENSION 07/19/2007  . HEMORRHOIDS, INTERNAL 12/01/2008    History   Social History  . Marital Status: Single    Spouse Name: N/A    Number of Children: N/A  . Years of Education: N/A   Occupational History  . Not on file.   Social History Main Topics  . Smoking status: Former Games developer  . Smokeless tobacco: Not on file  . Alcohol Use: No  . Drug Use: No  . Sexually Active: Not on file   Other Topics Concern  . Not on file   Social History Narrative  . No narrative on file    No past surgical history on file.  No family history on file.  No Known Allergies  Current Outpatient Prescriptions on File Prior to Visit  Medication Sig Dispense Refill  . cyclobenzaprine (FLEXERIL) 10 MG tablet 1/2 - 1 by mouth qhs as needed       . FLUoxetine (PROZAC) 40 MG capsule Take 1 tablet by mouth daily.        BP 116/74  Temp 99 F (37.2 C) (Oral)  Wt 273 lb (123.832 kg)       Objective:   Physical Exam  Constitutional: He is oriented to person, place, and time. He appears well-developed and well-nourished.  HENT:  Head: Normocephalic  and atraumatic.  Neck: Normal range of motion.  Cardiovascular: Normal rate, regular rhythm and normal heart sounds.   Pulmonary/Chest: Effort normal and breath sounds normal. He has no wheezes.  Musculoskeletal:       Somatic dysfunction T3 - 4 (rotated left),  T7-9 (rotated right).  C5-7 rotated right,  C3-4 rotated left    Neurological: He is alert and oriented to person, place, and time. No cranial nerve deficit.          Assessment & Plan:

## 2012-07-30 ENCOUNTER — Ambulatory Visit: Payer: 59 | Admitting: Internal Medicine

## 2013-12-16 ENCOUNTER — Ambulatory Visit (INDEPENDENT_AMBULATORY_CARE_PROVIDER_SITE_OTHER): Payer: BC Managed Care – PPO | Admitting: Internal Medicine

## 2013-12-16 ENCOUNTER — Encounter: Payer: Self-pay | Admitting: Internal Medicine

## 2013-12-16 VITALS — BP 120/90 | HR 78 | Temp 99.0°F | Resp 20 | Wt 311.0 lb

## 2013-12-16 DIAGNOSIS — F329 Major depressive disorder, single episode, unspecified: Secondary | ICD-10-CM

## 2013-12-16 DIAGNOSIS — N419 Inflammatory disease of prostate, unspecified: Secondary | ICD-10-CM

## 2013-12-16 DIAGNOSIS — F3289 Other specified depressive episodes: Secondary | ICD-10-CM

## 2013-12-16 DIAGNOSIS — E785 Hyperlipidemia, unspecified: Secondary | ICD-10-CM

## 2013-12-16 DIAGNOSIS — I10 Essential (primary) hypertension: Secondary | ICD-10-CM

## 2013-12-16 MED ORDER — CIPROFLOXACIN HCL 500 MG PO TABS: 500.0000 mg | ORAL_TABLET | Freq: Two times a day (BID) | ORAL | Status: DC

## 2013-12-16 NOTE — Progress Notes (Signed)
   Subjective:    Patient ID: Brian Rowe, male    DOB: 12/29/1975, 38 y.o.   MRN: [id number]  HPI  38 year old patient who has a history of anxiety depression as well as exogenous obesity. Chief complaint today is a constant discomfort and low back pain. He's also noticed some urinary frequency. He states that he was here for a prostate infection 14 months ago by a [place] physician and these symptoms are similar. Has multiple complaints including the left foot and ankle pain. He is status post surgery for club left foot. He has seen [place] [facility] in the past.  Past Medical History  Diagnosis Date  . HYPERLIPIDEMIA 07/19/2007  . ANXIETY 07/19/2007  . DEPRESSION 07/19/2007  . VISUAL IMPAIRMENT 10/15/2007  . HYPERTENSION 07/19/2007  . HEMORRHOIDS, INTERNAL 12/01/2008    History   Social History  . Marital Status: Single    Spouse Name: N/A    Number of Children: N/A  . Years of Education: N/A   Occupational History  . Not on file.   Social History Main Topics  . Smoking status: Former Games developermoker  . Smokeless tobacco: Not on file  . Alcohol Use: No  . Drug Use: No  . Sexual Activity: Not on file   Other Topics Concern  . Not on file   Social History Narrative  . No narrative on file    History reviewed. No pertinent past surgical history.  No family history on file.  No Known Allergies  Current Outpatient Prescriptions on File Prior to Visit  Medication Sig Dispense Refill  . cyclobenzaprine (FLEXERIL) 10 MG tablet 1/2 - 1 by mouth qhs as needed       . meloxicam (MOBIC) 15 MG tablet Take 1 tablet (15 mg total) by mouth daily.  30 tablet  1   No current facility-administered medications on file prior to visit.    BP 120/90  Pulse 78  Temp(Src) 99 F (37.2 C) (Oral)  Resp 20  Wt 311 lb (141.069 kg)  SpO2 97%     Review of Systems  Constitutional: Negative for fever, chills, appetite change and fatigue.  HENT: Negative for congestion, dental problem,  ear pain, hearing loss, sore throat, tinnitus, trouble swallowing and voice change.   Eyes: Negative for pain, discharge and visual disturbance.  Respiratory: Negative for cough, chest tightness, wheezing and stridor.   Cardiovascular: Negative for chest pain, palpitations and leg swelling.  Gastrointestinal: Negative for nausea, vomiting, abdominal pain, diarrhea, constipation, blood in stool and abdominal distention.  Genitourinary: Positive for difficulty urinating and genital sores. Negative for urgency, hematuria, flank pain and discharge.  Musculoskeletal: Positive for back pain. Negative for arthralgias, gait problem, joint swelling, myalgias and neck stiffness.  Skin: Negative for rash.  Neurological: Negative for dizziness, syncope, speech difficulty, weakness, numbness and headaches.  Hematological: Negative for adenopathy. Does not bruise/bleed easily.  Psychiatric/Behavioral: Negative for behavioral problems and dysphoric mood. The patient is not nervous/anxious.        Objective:   Physical Exam  Constitutional: He appears well-developed and well-nourished. No distress.  Genitourinary: Rectum normal and penis normal. No penile tenderness.  Prostate felt normal but was slightly tender. 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 We'll treat with Cipro for 2 weeks Anxiety depression History of hypertension  Schedule CPX

## 2013-12-16 NOTE — Progress Notes (Signed)
Pre-visit discussion using our clinic review tool. No additional management support is needed unless otherwise documented below in the visit note.  

## 2013-12-16 NOTE — Patient Instructions (Signed)
Take your antibiotic as prescribed until ALL of it is gone, but stop if you develop a rash, swelling, or any side effects of the medication.  Contact our office as soon as possible if  there are side effects of the medication.  Prostatitis The prostate gland is about the size and shape of a walnut. It is located just below your bladder. It produces one of the components of semen, which is made up of sperm and the fluids that help nourish and transport it out from the testicles. Prostatitis is inflammation of the prostate gland.  There are four types of prostatitis:  Acute bacterial prostatitis This is the least common type of prostatitis. It starts quickly and usually is associated with a bladder infection, high fever, and shaking chills. It can occur at any age.  Chronic bacterial prostatitis This is a persistent bacterial infection in the prostate. It usually develops from repeated acute bacterial prostatitis or acute bacterial prostatitis that was not properly treated. It can occur in men of any age but is most common in middle-aged men whose prostate has begun to enlarge. The symptoms are not as severe as those in acute bacterial prostatitis. Discomfort in the part of your body that is in front of your rectum and below your scrotum (perineum), lower abdomen, or in the head of your penis (glans) may represent your primary discomfort.  Chronic prostatitis (nonbacterial) This is the most common type of prostatitis. It is inflammation of the prostate gland that is not caused by a bacterial infection. The cause is unknown and may be associated with a viral infection or autoimmune disorder.  Prostatodynia (pelvic floor disorder) This is associated with increased muscular tone in the pelvis surrounding the prostate. CAUSES The causes of bacterial prostatitis are bacterial infection. The causes of the other types of prostatitis are unknown.  SYMPTOMS  Symptoms can vary depending upon the type of  prostatitis that exists. There can also be overlap in symptoms. Possible symptoms for each type of prostatitis are listed below. Acute Bacterial Prostatitis  Painful urination.  Fever or chills.  Muscle or joint pains.  Low back pain.  Low abdominal pain.  Inability to empty bladder completely. Chronic Bacterial Prostatitis, Chronic Nonbacterial Prostatitis, and Prostatodynia  Sudden urge to urinate.  Frequent urination.  Difficulty starting urine stream.  Weak urine stream.  Discharge from the urethra.  Dribbling after urination.  Rectal pain.  Pain in the testicles, penis, or tip of the penis.  Pain in the perineum.  Problems with sexual function.  Painful ejaculation.  Bloody semen. DIAGNOSIS  In order to diagnose prostatitis, your health care provider will ask about your symptoms. One or more urine samples will be taken and tested (urinalysis). If the urinalysis result is negative for bacteria, your health care provider may use a finger to feel your prostate (digital rectal exam). This exam helps your health care provider determine if your prostate is swollen and tender. It will also produce a specimen of semen that can be analyzed. TREATMENT  Treatment for prostatitis depends on the cause. If a bacterial infection is the cause, it can be treated with antibiotic medicine. In cases of chronic bacterial prostatitis, the use of antibiotics for up to 1 month or 6 weeks may be necessary. Your health care provider may instruct you to take sitz baths to help relieve pain. A sitz bath is a bath of hot water in which your hips and buttocks are under water. This relaxes the pelvic floor muscles  and often helps to relieve the pressure on your prostate. HOME CARE INSTRUCTIONS   Take all medicines as directed by your health care provider.  Take sitz baths as directed by your health care provider. SEEK MEDICAL CARE IF:   Your symptoms get worse, not better.  You have a  fever. SEEK IMMEDIATE MEDICAL CARE IF:   You have chills.  You feel nauseous or vomit.  You feel lightheaded or faint.  You are unable to urinate.  You have blood or blood clots in your urine. Document Released: 11/18/2000 Document Revised: 09/11/2013 Document Reviewed: 06/10/2013 [facility] Patient Information 2014 [place], [place].

## 2013-12-30 ENCOUNTER — Telehealth: Payer: Self-pay | Admitting: Internal Medicine

## 2013-12-30 MED ORDER — CIPROFLOXACIN HCL 500 MG PO TABS: 500.0000 mg | ORAL_TABLET | Freq: Two times a day (BID) | ORAL | Status: DC

## 2013-12-30 NOTE — Telephone Encounter (Signed)
Generic Cipro 500 mg #40 one twice a day

## 2013-12-30 NOTE — Telephone Encounter (Signed)
Please advise 

## 2013-12-30 NOTE — Telephone Encounter (Signed)
Pt was seen on 1-12 for prostate inf with dr Kirtland Bouchardk  Pt would like another round cipro call into [facility].

## 2013-12-30 NOTE — Telephone Encounter (Signed)
Pt notified Rx sent to pharmacy

## 2014-01-07 ENCOUNTER — Telehealth: Payer: Self-pay | Admitting: Internal Medicine

## 2014-01-07 NOTE — Telephone Encounter (Signed)
Relevant patient education mailed to patient.  

## 2014-06-24 ENCOUNTER — Ambulatory Visit (HOSPITAL_BASED_OUTPATIENT_CLINIC_OR_DEPARTMENT_OTHER): Payer: BC Managed Care – PPO | Attending: Otolaryngology | Admitting: Radiology

## 2014-06-24 VITALS — Ht 75.0 in

## 2014-06-24 DIAGNOSIS — G4730 Sleep apnea, unspecified: Secondary | ICD-10-CM

## 2014-06-24 DIAGNOSIS — R0683 Snoring: Secondary | ICD-10-CM

## 2014-06-24 DIAGNOSIS — G4710 Hypersomnia, unspecified: Secondary | ICD-10-CM

## 2014-06-24 DIAGNOSIS — R0609 Other forms of dyspnea: Secondary | ICD-10-CM | POA: Diagnosis not present

## 2014-06-24 DIAGNOSIS — G4733 Obstructive sleep apnea (adult) (pediatric): Secondary | ICD-10-CM | POA: Insufficient documentation

## 2014-06-24 DIAGNOSIS — R5381 Other malaise: Secondary | ICD-10-CM

## 2014-06-24 DIAGNOSIS — R5383 Other fatigue: Secondary | ICD-10-CM

## 2014-06-24 DIAGNOSIS — R0989 Other specified symptoms and signs involving the circulatory and respiratory systems: Secondary | ICD-10-CM | POA: Insufficient documentation

## 2014-06-28 DIAGNOSIS — G4710 Hypersomnia, unspecified: Secondary | ICD-10-CM

## 2014-06-28 DIAGNOSIS — G4730 Sleep apnea, unspecified: Secondary | ICD-10-CM

## 2014-06-28 NOTE — Sleep Study (Signed)
   NAME: Brian Rowe DATE OF BIRTH:  11/18/1976 MEDICAL RECORD NUMBER [id number]  LOCATION: [facility]  PHYSICIAN: Brysun Eschmann D  DATE OF STUDY: 06/24/2014  SLEEP STUDY TYPE: Nocturnal Polysomnogram               REFERRING PHYSICIAN: Flo ShanksWolicki, Karol, MD  INDICATION FOR STUDY: Hypersomnia with sleep apnea  EPWORTH SLEEPINESS SCORE:   10/24 HEIGHT: 6\' 3"  (190.5 cm)  WEIGHT:  (315#)    Body mass index is 0.00 kg/(m^2).  NECK SIZE: 19 in.  MEDICATIONS: Charted for review  SLEEP ARCHITECTURE: Split study protocol recorded as a daytime study. During the diagnostic phase, total sleep time 124.5 minutes with sleep efficiency 89.6%. Stage I was 21.3%, stage II 78.7%, stage III and REM were absent. Sleep latency 8.5 minutes, awake after sleep onset 5.5 minutes, arousal index 34.2, bedtime medication: None.       Lights out 8:35 AM with lights on to end study at 15:15 p.m.  RESPIRATORY DATA: Apnea hypopneas index (AHI) 58.8 per hour. 122 total events scored including 18 obstructive apneas and 104 hypopneas. Most events were while supine. CPAP titration to 16 CWP, AHI 0 per hour. He wore a medium fullface mask.  OXYGEN DATA: Very loud snoring before CPAP with oxygen desaturation to a nadir of 78% on room air. With CPAP control there were still some episodes of mild breakthrough snoring with oxygen saturation 94.1% on room air.  CARDIAC DATA: Sinus rhythm  MOVEMENT/PARASOMNIA: No significant movement disturbance, bathroom x1  IMPRESSION/ RECOMMENDATION:   1) Severe obstructive sleep apnea/hypoxia syndrome, AHI 58.8 per hour with most events associated with supine sleep position. Very loud snoring with oxygen desaturation to a nadir of 78% on room air. 2) Successful CPAP titration to 16 CWP, AHI 0 per hour. He wore a medium [employer] Simplus fullface mask with heated humidifier. There was some mild occasional breakthrough snoring at final pressure, with oxygen  saturation 94.1% on room air.  3) This study was recorded as a daytime polysomnogram to accommodate the patient's usual sleep schedule 4) a previous polysomnogram on 11/23/2007 recorded AHI 15.7 per hour with body weight 272 pounds and CPAP titration to 11 CWP at that time.  Signed Brian Duhamellinton Dezarai Prew, MD Brian Rowe,Brian Rowe, American Board of Sleep Medicine  ELECTRONICALLY SIGNED ON:  06/28/2014, 3:41 PM [facility] PH: [phone]   FX: [phone] ACCREDITED BY THE AMERICAN ACADEMY OF SLEEP MEDICINE

## 2014-08-04 ENCOUNTER — Encounter: Payer: Self-pay | Admitting: Internal Medicine

## 2014-08-04 ENCOUNTER — Ambulatory Visit (INDEPENDENT_AMBULATORY_CARE_PROVIDER_SITE_OTHER): Payer: BC Managed Care – PPO | Admitting: Internal Medicine

## 2014-08-04 DIAGNOSIS — E785 Hyperlipidemia, unspecified: Secondary | ICD-10-CM

## 2014-08-04 DIAGNOSIS — R5383 Other fatigue: Secondary | ICD-10-CM

## 2014-08-04 DIAGNOSIS — Z9989 Dependence on other enabling machines and devices: Secondary | ICD-10-CM

## 2014-08-04 DIAGNOSIS — R635 Abnormal weight gain: Secondary | ICD-10-CM

## 2014-08-04 DIAGNOSIS — R5381 Other malaise: Secondary | ICD-10-CM

## 2014-08-04 DIAGNOSIS — I10 Essential (primary) hypertension: Secondary | ICD-10-CM

## 2014-08-04 DIAGNOSIS — F3289 Other specified depressive episodes: Secondary | ICD-10-CM

## 2014-08-04 DIAGNOSIS — G4733 Obstructive sleep apnea (adult) (pediatric): Secondary | ICD-10-CM

## 2014-08-04 DIAGNOSIS — F329 Major depressive disorder, single episode, unspecified: Secondary | ICD-10-CM

## 2014-08-04 LAB — LIPID PANEL
Cholesterol: 247 mg/dL — ABNORMAL HIGH (ref 0–200)
HDL: 36.4 mg/dL — ABNORMAL LOW (ref >39.00)
NonHDL: 210.6
Total CHOL/HDL Ratio: 7
Triglycerides: 215 mg/dL — ABNORMAL HIGH (ref 0.0–149.0)
VLDL: 43 mg/dL — ABNORMAL HIGH (ref 0.0–40.0)

## 2014-08-04 LAB — CBC WITH DIFFERENTIAL/PLATELET
Basophils Absolute: 0 10*3/uL (ref 0.0–0.1)
Basophils Relative: 0.4 % (ref 0.0–3.0)
Eosinophils Absolute: 0.2 10*3/uL (ref 0.0–0.7)
Eosinophils Relative: 3.3 % (ref 0.0–5.0)
HCT: 51.1 % (ref 39.0–52.0)
Hemoglobin: 17.4 g/dL — ABNORMAL HIGH (ref 13.0–17.0)
Lymphocytes Relative: 24.5 % (ref 12.0–46.0)
Lymphs Abs: 1.7 10*3/uL (ref 0.7–4.0)
MCHC: 34 g/dL (ref 30.0–36.0)
MCV: 95.6 fl (ref 78.0–100.0)
Monocytes Absolute: 0.5 10*3/uL (ref 0.1–1.0)
Monocytes Relative: 7.3 % (ref 3.0–12.0)
Neutro Abs: 4.5 10*3/uL (ref 1.4–7.7)
Neutrophils Relative %: 64.5 % (ref 43.0–77.0)
Platelets: 204 10*3/uL (ref 150.0–400.0)
RBC: 5.34 Mil/uL (ref 4.22–5.81)
RDW: 13.8 % (ref 11.5–15.5)
WBC: 7.1 10*3/uL (ref 4.0–10.5)

## 2014-08-04 LAB — COMPREHENSIVE METABOLIC PANEL
ALT: 42 U/L (ref 0–53)
AST: 28 U/L (ref 0–37)
Albumin: 4.3 g/dL (ref 3.5–5.2)
Alkaline Phosphatase: 48 U/L (ref 39–117)
BUN: 12 mg/dL (ref 6–23)
CO2: 29 mEq/L (ref 19–32)
Calcium: 9.5 mg/dL (ref 8.4–10.5)
Chloride: 101 mEq/L (ref 96–112)
Creatinine, Ser: 1.3 mg/dL (ref 0.4–1.5)
GFR: 68.56 mL/min (ref >60.00)
Glucose, Bld: 86 mg/dL (ref 70–99)
Potassium: 4.1 mEq/L (ref 3.5–5.1)
Sodium: 138 mEq/L (ref 135–145)
Total Bilirubin: 0.8 mg/dL (ref 0.2–1.2)
Total Protein: 8.2 g/dL (ref 6.0–8.3)

## 2014-08-04 LAB — T4, FREE: Free T4: 0.73 ng/dL (ref 0.60–1.60)

## 2014-08-04 LAB — LDL CHOLESTEROL, DIRECT: Direct LDL: 201.4 mg/dL

## 2014-08-04 LAB — TSH: TSH: 3.27 u[IU]/mL (ref 0.35–4.50)

## 2014-08-04 NOTE — Progress Notes (Signed)
Subjective:    Patient ID: Brian Rowe, male    DOB: 26-Sep-1976, 38 y.o.   MRN: [id number]  HPI 38 year old patient who is seen today for evaluation.  He has concerns about the thyroid disease.  Due to fatigue.  He has also had weight gain in spite of attempts at low calorie diets.  He has been diagnosed with obstructive sleep apnea and has been using CPAP although a bit inconsistently.  He continues to have some early morning and daytime sleepiness.  He is followed by psychiatry for anxiety disorder and does use alprazolam at bedtime to assist with sleep. He did have an inpatient split night sleep study performed and is on a pressure support of 16 cm  Past Medical History  Diagnosis Date  . HYPERLIPIDEMIA 07/19/2007  . ANXIETY 07/19/2007  . DEPRESSION 07/19/2007  . VISUAL IMPAIRMENT 10/15/2007  . HYPERTENSION 07/19/2007  . HEMORRHOIDS, INTERNAL 12/01/2008    History   Social History  . Marital Status: Single    Spouse Name: N/A    Number of Children: N/A  . Years of Education: N/A   Occupational History  . Not on file.   Social History Main Topics  . Smoking status: Former Games developer  . Smokeless tobacco: Not on file  . Alcohol Use: No  . Drug Use: No  . Sexual Activity: Not on file   Other Topics Concern  . Not on file   Social History Narrative  . No narrative on file    History reviewed. No pertinent past surgical history.  No family history on file.  No Known Allergies  Current Outpatient Prescriptions on File Prior to Visit  Medication Sig Dispense Refill  . alprazolam (XANAX) 2 MG tablet Take 2 mg by mouth 3 (three) times daily as needed for sleep.      . cyclobenzaprine (FLEXERIL) 10 MG tablet 1/2 - 1 by mouth qhs as needed       . meloxicam (MOBIC) 15 MG tablet Take 1 tablet (15 mg total) by mouth daily.  30 tablet  1   No current facility-administered medications on file prior to visit.    BP 143/88  Pulse 73  Temp(Src) 98.4 F (36.9 C) (Oral)   Resp 20  Ht  (1.905 m)  Wt 322 lb (146.058 kg)  BMI 40.25 kg/m2  SpO2 98%      Review of Systems  Constitutional: Positive for fatigue and unexpected weight change. Negative for fever, chills and appetite change.  HENT: Negative for congestion, dental problem, ear pain, hearing loss, sore throat, tinnitus, trouble swallowing and voice change.   Eyes: Negative for pain, discharge and visual disturbance.  Respiratory: Negative for cough, chest tightness, wheezing and stridor.   Cardiovascular: Negative for chest pain, palpitations and leg swelling.  Gastrointestinal: Negative for nausea, vomiting, abdominal pain, diarrhea, constipation, blood in stool and abdominal distention.  Genitourinary: Negative for urgency, hematuria, flank pain, discharge, difficulty urinating and genital sores.  Musculoskeletal: Negative for arthralgias, back pain, gait problem, joint swelling, myalgias and neck stiffness.  Skin: Negative for rash.  Neurological: Negative for dizziness, syncope, speech difficulty, weakness, numbness and headaches.  Hematological: Negative for adenopathy. Does not bruise/bleed easily.  Psychiatric/Behavioral: Positive for sleep disturbance. Negative for behavioral problems and dysphoric mood. The patient is nervous/anxious.        Objective:   Physical Exam  Constitutional: He is oriented to person, place, and time. He appears well-developed.  HENT:  Head: Normocephalic.  Right Ear:  External ear normal.  Left Ear: External ear normal.  Status post palatoplasty  Eyes: Conjunctivae and EOM are normal.  Neck: Normal range of motion.  Cardiovascular: Normal rate and normal heart sounds.   Pulmonary/Chest: Breath sounds normal.  Abdominal: Bowel sounds are normal.  Musculoskeletal: Normal range of motion. He exhibits no edema and no tenderness.  Neurological: He is alert and oriented to person, place, and time.  Psychiatric: He has a normal mood and affect. His behavior  is normal.          Assessment & Plan:   Morbid obesity OSA on CPAP Hypertension controlled.  Repeat blood pressure 110/70 Anxiety disorder Fatigue.  We'll check thyroid indices  Weight loss and exercise encouraged We'll consider referral to nutrition

## 2014-08-04 NOTE — Progress Notes (Signed)
Pre visit review using our clinic review tool, if applicable. No additional management support is needed unless otherwise documented below in the visit note. 

## 2014-08-04 NOTE — Patient Instructions (Signed)
Limit your sodium (Salt) intake    It is important that you exercise regularly, at least 20 minutes 3 to 4 times per week.  If you develop chest pain or shortness of breath seek  medical attention.  You need to lose weight.  Consider a lower calorie diet and regular exercise. 

## 2014-08-05 ENCOUNTER — Telehealth: Payer: Self-pay | Admitting: Internal Medicine

## 2014-08-05 NOTE — Telephone Encounter (Signed)
Pt would like a call back about his thyroid results    Pt also would like to know if Dr Kirtland Bouchard would except him as a new pt

## 2014-08-05 NOTE — Telephone Encounter (Signed)
Please notify patient as thyroid test is normal, but his cholesterol is slightly elevated Yes.  I will accept as a new patient

## 2014-08-05 NOTE — Telephone Encounter (Signed)
Please advise 

## 2014-08-06 NOTE — Telephone Encounter (Signed)
See result note.  

## 2014-08-06 NOTE — Telephone Encounter (Signed)
Brian Rowe, Dr. Kirtland Bouchard will accept pt as a new patient.

## 2014-08-15 NOTE — Telephone Encounter (Signed)
lmovm letting pt know dr Kirtland Bouchard will accept him

## 2014-10-01 ENCOUNTER — Ambulatory Visit (INDEPENDENT_AMBULATORY_CARE_PROVIDER_SITE_OTHER): Payer: BC Managed Care – PPO | Admitting: Internal Medicine

## 2014-10-01 ENCOUNTER — Encounter: Payer: Self-pay | Admitting: Internal Medicine

## 2014-10-01 DIAGNOSIS — A6000 Herpesviral infection of urogenital system, unspecified: Secondary | ICD-10-CM

## 2014-10-01 DIAGNOSIS — Z9989 Dependence on other enabling machines and devices: Secondary | ICD-10-CM

## 2014-10-01 DIAGNOSIS — G4733 Obstructive sleep apnea (adult) (pediatric): Secondary | ICD-10-CM

## 2014-10-01 MED ORDER — VALACYCLOVIR HCL 500 MG PO TABS: 500.0000 mg | ORAL_TABLET | Freq: Two times a day (BID) | ORAL | Status: DC

## 2014-10-01 NOTE — Patient Instructions (Signed)
Genital Herpes °Genital herpes is a sexually transmitted disease. This means that it is a disease passed by having sex with an infected person. There is no cure for genital herpes. The time between attacks can be months to years. The virus may live in a person but produce no problems (symptoms). This infection can be passed to a baby as it travels down the birth canal (vagina). In a newborn, this can cause central nervous system damage, eye damage, or even death. The virus that causes genital herpes is usually HSV-2 virus. The virus that causes oral herpes is usually HSV-1. The diagnosis (learning what is wrong) is made through culture results. °SYMPTOMS  °Usually symptoms of pain and itching begin a few days to a week after contact. It first appears as small blisters that progress to small painful ulcers which then scab over and heal after several days. It affects the outer genitalia, birth canal, cervix, penis, anal area, buttocks, and thighs. °HOME CARE INSTRUCTIONS  °· Keep ulcerated areas dry and clean. °· Take medications as directed. Antiviral medications can speed up healing. They will not prevent recurrences or cure this infection. These medications can also be taken for suppression if there are frequent recurrences. °· While the infection is active, it is contagious. Avoid all sexual contact during active infections. °· Condoms may help prevent spread of the herpes virus. °· Practice safe sex. °· Wash your hands thoroughly after touching the genital area. °· Avoid touching your eyes after touching your genital area. °· Inform your caregiver if you have had genital herpes and become pregnant. It is your responsibility to insure a safe outcome for your baby in this pregnancy. °· Only take over-the-counter or prescription medicines for pain, discomfort, or fever as directed by your caregiver. °SEEK MEDICAL CARE IF:  °· You have a recurrence of this infection. °· You do not respond to medications and are not  improving. °· You have new sources of pain or discharge which have changed from the original infection. °· You have an oral temperature above 102° F (38.9° C). °· You develop abdominal pain. °· You develop eye pain or signs of eye infection. °Document Released: 11/18/2000 Document Revised: 02/13/2012 Document Reviewed: 12/09/2009 °ExitCare® Patient Information ©2015 ExitCare, LLC. This information is not intended to replace advice given to you by your health care provider. Make sure you discuss any questions you have with your health care provider. ° °

## 2014-10-01 NOTE — Progress Notes (Signed)
Subjective:    Patient ID: Brian Rowe, male    DOB: 22-Apr-1976, 38 y.o.   MRN: [id number]  HPI  38 year old patient who presents with a chief complaint of a flare of genital herpes involving his penile shaft. His follow closely by orthopedics due to bilateral ankle and foot pain.  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  He has persistent left foot and ankle pain. He has a history of dyslipidemia and has recently resumed a diet. He has been followed by psychiatry due to generalized anxiety disorder and depression.  He also has a history of panic attacks He is in process of filing for disability due to his orthopedic and psychiatric issues. He is followed by ENT due to OSA and is on CPAP.  He has a history of morbid obesity.  Past Medical History  Diagnosis Date  . HYPERLIPIDEMIA 07/19/2007  . ANXIETY 07/19/2007  . DEPRESSION 07/19/2007  . VISUAL IMPAIRMENT 10/15/2007  . HYPERTENSION 07/19/2007  . HEMORRHOIDS, INTERNAL 12/01/2008    History   Social History  . Marital Status: Single    Spouse Name: N/A    Number of Children: N/A  . Years of Education: N/A   Occupational History  . Not on file.   Social History Main Topics  . Smoking status: Former Games developermoker  . Smokeless tobacco: Not on file  . Alcohol Use: No  . Drug Use: No  . Sexual Activity: Not on file   Other Topics Concern  . Not on file   Social History Narrative  . No narrative on file    History reviewed. No pertinent past surgical history.  No family history on file.  No Known Allergies  Current Outpatient Prescriptions on File Prior to Visit  Medication Sig Dispense Refill  . alprazolam (XANAX) 2 MG tablet Take 2 mg by mouth 3 (three) times daily as needed for sleep.      . citalopram (CELEXA) 20 MG tablet Take 20 mg by mouth daily.       . cyclobenzaprine (FLEXERIL) 10 MG tablet 1/2 - 1 by mouth qhs as needed       . meloxicam  (MOBIC) 15 MG tablet Take 1 tablet (15 mg total) by mouth daily.  30 tablet  1   No current facility-administered medications on file prior to visit.    BP 130/86  Pulse 82  Resp 20  Ht 6\' 3"  (1.905 m)  Wt 329 lb (149.233 kg)  BMI 41.12 kg/m2  SpO2 98%     Review of Systems  Constitutional: Positive for unexpected weight change. Negative for fever, chills, appetite change and fatigue.  HENT: Negative for congestion, dental problem, ear pain, hearing loss, sore throat, tinnitus, trouble swallowing and voice change.   Eyes: Negative for pain, discharge and visual disturbance.  Respiratory: Negative for cough, chest tightness, wheezing and stridor.   Cardiovascular: Negative for chest pain, palpitations and leg swelling.  Gastrointestinal: Negative for nausea, vomiting, abdominal pain, diarrhea, constipation, blood in stool and abdominal distention.  Genitourinary: Negative for urgency, hematuria, flank pain, discharge, difficulty urinating and genital sores.  Musculoskeletal: Positive for arthralgias and gait problem. Negative for back pain, joint swelling, myalgias and neck stiffness.  Skin: Positive for rash.  Neurological: Negative for dizziness, syncope, speech difficulty, weakness, numbness and headaches.  Hematological: Negative for adenopathy. Does not bruise/bleed easily.  Psychiatric/Behavioral: Negative for behavioral problems and dysphoric mood.  The patient is not nervous/anxious.        Objective:   Physical Exam  Constitutional: He appears well-developed and well-nourished. No distress.  Blood pressure 130/86 Weight 329  Musculoskeletal:  Left ankle edema  Skin:  Scattered superficial herpetic lesions involving the penile shaft near the head of the penis          Assessment & Plan:   Genital herpes flare.  Will treat with Valtrex for 5 days History of anxiety, depression.  Follow-up psychiatry Morbid obesity.  Follow-up dietary plan Chronic bilateral  foot pain.  Follow-up orthopedics.  He is considering surgery but cost has been a consideration

## 2014-10-01 NOTE — Progress Notes (Signed)
Pre visit review using our clinic review tool, if applicable. No additional management support is needed unless otherwise documented below in the visit note. 

## 2014-10-06 ENCOUNTER — Telehealth: Payer: Self-pay | Admitting: Internal Medicine

## 2014-10-06 DIAGNOSIS — M549 Dorsalgia, unspecified: Secondary | ICD-10-CM

## 2014-10-06 NOTE — Telephone Encounter (Signed)
Okay to do referral

## 2014-10-06 NOTE — Telephone Encounter (Signed)
Pt is asking for a referral to pain management

## 2014-10-06 NOTE — Telephone Encounter (Signed)
Pt notified order for referral for pain management sent and someone will be contacting you.

## 2014-10-06 NOTE — Telephone Encounter (Signed)
ok 

## 2014-11-21 ENCOUNTER — Encounter: Payer: Self-pay | Admitting: Physical Medicine & Rehabilitation

## 2014-12-30 ENCOUNTER — Encounter
Payer: BLUE CROSS/BLUE SHIELD | Attending: Physical Medicine & Rehabilitation | Admitting: Physical Medicine & Rehabilitation

## 2014-12-30 ENCOUNTER — Encounter: Payer: Self-pay | Admitting: Physical Medicine & Rehabilitation

## 2014-12-30 ENCOUNTER — Other Ambulatory Visit: Payer: Self-pay | Admitting: Physical Medicine & Rehabilitation

## 2014-12-30 VITALS — BP 140/78 | HR 86 | Resp 14

## 2014-12-30 DIAGNOSIS — M7661 Achilles tendinitis, right leg: Secondary | ICD-10-CM | POA: Insufficient documentation

## 2014-12-30 DIAGNOSIS — F419 Anxiety disorder, unspecified: Secondary | ICD-10-CM | POA: Diagnosis not present

## 2014-12-30 DIAGNOSIS — M1990 Unspecified osteoarthritis, unspecified site: Secondary | ICD-10-CM | POA: Diagnosis not present

## 2014-12-30 DIAGNOSIS — M25571 Pain in right ankle and joints of right foot: Secondary | ICD-10-CM | POA: Diagnosis not present

## 2014-12-30 DIAGNOSIS — M79671 Pain in right foot: Secondary | ICD-10-CM | POA: Diagnosis not present

## 2014-12-30 DIAGNOSIS — Z79899 Other long term (current) drug therapy: Secondary | ICD-10-CM

## 2014-12-30 DIAGNOSIS — G8929 Other chronic pain: Secondary | ICD-10-CM | POA: Diagnosis present

## 2014-12-30 DIAGNOSIS — M19272 Secondary osteoarthritis, left ankle and foot: Secondary | ICD-10-CM

## 2014-12-30 DIAGNOSIS — G894 Chronic pain syndrome: Secondary | ICD-10-CM

## 2014-12-30 DIAGNOSIS — M7660 Achilles tendinitis, unspecified leg: Secondary | ICD-10-CM | POA: Diagnosis not present

## 2014-12-30 DIAGNOSIS — Z5181 Encounter for therapeutic drug level monitoring: Secondary | ICD-10-CM

## 2014-12-30 DIAGNOSIS — Z0289 Encounter for other administrative examinations: Secondary | ICD-10-CM

## 2014-12-30 DIAGNOSIS — M79672 Pain in left foot: Secondary | ICD-10-CM | POA: Diagnosis not present

## 2014-12-30 DIAGNOSIS — M25572 Pain in left ankle and joints of left foot: Secondary | ICD-10-CM | POA: Diagnosis not present

## 2014-12-30 DIAGNOSIS — M19072 Primary osteoarthritis, left ankle and foot: Secondary | ICD-10-CM | POA: Insufficient documentation

## 2014-12-30 MED ORDER — DICLOFENAC SODIUM 1 % TD GEL: 1.0000 "application " | Freq: Three times a day (TID) | TRANSDERMAL | Status: DC

## 2014-12-30 NOTE — Progress Notes (Signed)
Subjective:    Patient ID: Brian Rowe, male    DOB: 04/18/1976, 39 y.o.   MRN: [id number]  HPI   This is an initial evaluation today for [name] who is a 39yo male with a history  He sees Dr. Victorino DikeHewitt currently for his feet/ankles. He saw Dr. Fannie KneeSue in the remote past for multiple surgeries on his feet to lengthen his tendons and for other ortho manipulation. He has had some "spurs" consequently on his right foot.  He has limitations of his range of motion in both legs. He has a left custom ankle brace and a standard right sided off the shelf gauntlet/ASO. 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 Victorino DikeHewitt prescribed him NSAID's for his ankle pain, but it really doesn't "touch" the pain. His left ankle is more painful in general than the right although both are unstable.   His low back pain seems to bother him the more he walks because of his altered mechanics. He also notices left hip pain when he's driving which can be severe.  He has also gained a lot of weight as a result of his pain and mobility limitations. He has lost weight recently by trying to improve his diet.    He also sees Dr. Evelene CroonKaur for a generalized anxiety disorder for which he takes adderall and xanax. She also has him on cymbalta and celexa as well.       Pain Inventory Average Pain 9 Pain Right Now 7 My pain is constant, sharp and stabbing  In the last 24 hours, has pain interfered with the following? General activity 10 Relation with others 8 Enjoyment of life 10 What TIME of day is your pain at its worst? daytime Sleep (in general) Poor  Pain is worse with: walking, inactivity, standing and some activites Pain improves with: rest and injections Relief from Meds: 8  Mobility use a cane how many minutes can you walk? 15 ability to climb steps?  yes do you drive?  yes  Function disabled: date disabled  2013  Neuro/Psych weakness numbness trouble walking depression anxiety  Prior Studies Any changes since last visit?  no  Physicians involved in your care Any changes since last visit?  no   Family History  Problem Relation Age of Onset  . Hypertension Father    History   Social History  . Marital Status: Single    Spouse Name: N/A    Number of Children: N/A  . Years of Education: N/A   Social History Main Topics  . Smoking status: Former Smoker    Quit date: 12/06/2011  . Smokeless tobacco: None  . Alcohol Use: 0.6 oz/week    1 Cans of beer per week  . Drug Use: No  . Sexual Activity: Yes   Other Topics Concern  . None   Social History Narrative   Past Surgical History  Procedure Laterality Date  . Left ankle Left   . Throat surgery    . Nasal septum surgery     Past Medical History  Diagnosis Date  . HYPERLIPIDEMIA 07/19/2007  . ANXIETY 07/19/2007  . DEPRESSION 07/19/2007  . VISUAL IMPAIRMENT 10/15/2007  . HYPERTENSION 07/19/2007  . HEMORRHOIDS, INTERNAL 12/01/2008  . Sleep apnea    BP 140/78 mmHg  Pulse 86  Resp 14  SpO2 97%  Opioid Risk Score:   Fall Risk Score:    Review of Systems  HENT: Negative.   Respiratory: Positive for apnea and shortness of breath.        SLEEP APNEA  Cardiovascular: Negative.   Gastrointestinal: Negative.   Endocrine: Negative.   Genitourinary: Negative.   Musculoskeletal: Positive for myalgias, back pain and arthralgias.  Skin: Negative.   Allergic/Immunologic: Negative.   Neurological: Positive for weakness and numbness.       TROUBLE WALKING  Hematological: Negative.   Psychiatric/Behavioral: Positive for dysphoric mood. The patient is nervous/anxious.        Objective:   Physical Exam   General: Alert and oriented x 3, No apparent distress. Obese large framed HEENT: Head is normocephalic, atraumatic, PERRLA, EOMI, sclera anicteric, oral mucosa pink and moist, dentition intact, ext ear canals clear,   Neck: Supple without JVD or lymphadenopathy Heart: Reg rate and rhythm. No murmurs rubs or gallops Chest: CTA bilaterally without wheezes, rales, or rhonchi; no distress Abdomen: Soft, non-tender, non-distended, bowel sounds positive. Extremities: No clubbing, cyanosis, or edema. Pulses are 2+ Skin: Clean and intact without signs of breakdown Neuro: Pt is cognitively appropriate with normal insight, memory, and awareness. Cranial nerves 2-12 are intact. Sensory exam is normal---no obvious abnormalities in the left foot either. Reflexes are 2+ in all 4's. Fine motor coordination is intact. No tremors. Motor function is grossly 5/5 in all 4's with some pain inhibition weakness in the left foot.  Musculoskeletal: Full ROM, No pain with AROM or PROM in the neck, trunk, or upper extremities. he has a high arch on the left foot. He is tender with palpation over the talus and anterior/lateral left foot inparticular. He also has pain with ankle dorsiflexion, inv, evers. Mild pain over the achilles and calcaneus. Right heel tender along the posterior calcaneus and achilles insertion. Both achilles were tight but feet could be ranged to neutral. He's antalgic with gait more on the left than right. He does use the left hip to help assist swing of the left foot. Standing posture is fair. ?valgus left knee Psych: Pt's affect is appropriate. Pt is cooperative. Talkative but very pleasant        Assessment & Plan:  1. Chronic foot and ankle pain, left more than right---hx of multiple foot surgeries as a child due to ?devlopmental disorder. 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 2. Mild achilles tendonitis, ?right calcaneal spur 3. Morbid Obesity 4. Anxiety disorder   Plan: 1.Continued weight loss 2.Voltaren gel trial 3. Gel heel cushion (moreso for right foot). Also might be better off with an ankle gauntlet type brace for the left foot which would provide more 360* stability as opposed to  the hinged M-L stability brace he currently wears.  4. UDS was collected today. If no issues, will rx tramadol for pain control---50mg  q8 prn #60 5. Surgical follow up per Dr. Victorino Dike. Future left ankle/foot fusion? 6. Follow up with me or NP in about a month. Forty-five minutes of face to face patient care time were spent during this visit. All questions were encouraged and answered.

## 2014-12-30 NOTE — Patient Instructions (Signed)
TRY AN ANKLE GAUNTLET OR SLEEVE LIKE THE ONE YOU USE ON YOUR RIGHT ANKLE FOR YOUR LEFT ANKLE  BILATERAL GEL HEEL CUSHIONS  ALWAYS WEAR SUPPORTIVE, SHOCK ABSORBING SHOES.  ONCE I HAVE CONFIRMATION THAT YOUR URINE SPECIMEN IS CONSISTENT WITH YOUR HISTORY AND PRESCRIBED MEDICATIONS, I WILL BE WILLING TO PRESCRIBE YOUR PAIN MEDICATION. THE RESULTS OF YOUR URINE TESTING COULD TAKE A WEEK OR MORE TO RETURN, HOWEVER.  IF WE DO NOT CONTACT YOU REGARDING THESE RESULTS WITHIN 10 DAYS, PLEASE CONTACT US.

## 2014-12-31 LAB — PMP ALCOHOL METABOLITE (ETG): Ethyl Glucuronide (EtG): NEGATIVE ng/mL

## 2015-01-04 LAB — BENZODIAZEPINES (GC/LC/MS), URINE
Alprazolam metabolite (GC/LC/MS), ur confirm: 361 ng/mL (ref <25)
Clonazepam metabolite (GC/LC/MS), ur confirm: NEGATIVE ng/mL (ref <25)
Flurazepam metabolite (GC/LC/MS), ur confirm: NEGATIVE ng/mL (ref <50)
Lorazepam (GC/LC/MS), ur confirm: NEGATIVE ng/mL (ref <50)
Midazolam (GC/LC/MS), ur confirm: NEGATIVE ng/mL (ref <50)
Nordiazepam (GC/LC/MS), ur confirm: NEGATIVE ng/mL (ref <50)
Oxazepam (GC/LC/MS), ur confirm: NEGATIVE ng/mL (ref <50)
Temazepam (GC/LC/MS), ur confirm: NEGATIVE ng/mL (ref <50)
Triazolam metabolite (GC/LC/MS), ur confirm: NEGATIVE ng/mL (ref <50)

## 2015-01-06 LAB — PRESCRIPTION MONITORING PROFILE (SOLSTAS)
Amphetamine/Meth: NEGATIVE ng/mL
Barbiturate Screen, Urine: NEGATIVE ng/mL
Buprenorphine, Urine: NEGATIVE ng/mL
Cannabinoid Scrn, Ur: NEGATIVE ng/mL
Carisoprodol, Urine: NEGATIVE ng/mL
Cocaine Metabolites: NEGATIVE ng/mL
Creatinine, Urine: 257.61 mg/dL (ref >20.0)
Fentanyl, Ur: NEGATIVE ng/mL
MDMA URINE: NEGATIVE ng/mL
Meperidine, Ur: NEGATIVE ng/mL
Methadone Screen, Urine: NEGATIVE ng/mL
Nitrites, Initial: NEGATIVE ug/mL
Opiate Screen, Urine: NEGATIVE ng/mL
Oxycodone Screen, Ur: NEGATIVE ng/mL
Propoxyphene: NEGATIVE ng/mL
Tapentadol, urine: NEGATIVE ng/mL
Tramadol Scrn, Ur: NEGATIVE ng/mL
Zolpidem, Urine: NEGATIVE ng/mL
pH, Initial: 5.5 pH (ref 4.5–8.9)

## 2015-01-06 NOTE — Progress Notes (Signed)
Urine drug screen for this encounter is consistent for prescribed medication (alprazolam)

## 2015-01-07 ENCOUNTER — Telehealth: Payer: Self-pay | Admitting: *Deleted

## 2015-01-07 NOTE — Telephone Encounter (Signed)
Per my initial note, he may have 50mg  tramadol q8 prn #60, 0 RF

## 2015-01-07 NOTE — Telephone Encounter (Signed)
Pt had recent appt, calling about UDS results and the possibility of getting his rx for tramadol

## 2015-01-08 MED ORDER — TRAMADOL HCL 50 MG PO TABS: ORAL_TABLET | ORAL | Status: DC

## 2015-01-08 NOTE — Telephone Encounter (Signed)
UDS consistent ok to fill tramadol.  Left message for patient that [facility]

## 2015-01-09 ENCOUNTER — Telehealth: Payer: Self-pay | Admitting: Physical Medicine & Rehabilitation

## 2015-01-09 NOTE — Telephone Encounter (Signed)
[facility]

## 2015-01-09 NOTE — Telephone Encounter (Signed)
Needs to know if urine drug screen has come back and would like medication tramadol that Dr. [name] had spoken about.

## 2015-01-30 ENCOUNTER — Ambulatory Visit: Payer: BLUE CROSS/BLUE SHIELD | Admitting: Physical Medicine & Rehabilitation

## 2015-02-02 ENCOUNTER — Encounter
Payer: BLUE CROSS/BLUE SHIELD | Attending: Physical Medicine & Rehabilitation | Admitting: Physical Medicine & Rehabilitation

## 2015-02-02 ENCOUNTER — Encounter: Payer: Self-pay | Admitting: Physical Medicine & Rehabilitation

## 2015-02-02 DIAGNOSIS — M79671 Pain in right foot: Secondary | ICD-10-CM | POA: Diagnosis not present

## 2015-02-02 DIAGNOSIS — M79672 Pain in left foot: Secondary | ICD-10-CM | POA: Diagnosis not present

## 2015-02-02 DIAGNOSIS — M25571 Pain in right ankle and joints of right foot: Secondary | ICD-10-CM | POA: Insufficient documentation

## 2015-02-02 DIAGNOSIS — M25572 Pain in left ankle and joints of left foot: Secondary | ICD-10-CM | POA: Diagnosis not present

## 2015-02-02 DIAGNOSIS — M19272 Secondary osteoarthritis, left ankle and foot: Secondary | ICD-10-CM

## 2015-02-02 DIAGNOSIS — M7660 Achilles tendinitis, unspecified leg: Secondary | ICD-10-CM | POA: Diagnosis not present

## 2015-02-02 DIAGNOSIS — M7661 Achilles tendinitis, right leg: Secondary | ICD-10-CM

## 2015-02-02 DIAGNOSIS — M1990 Unspecified osteoarthritis, unspecified site: Secondary | ICD-10-CM | POA: Diagnosis not present

## 2015-02-02 DIAGNOSIS — F419 Anxiety disorder, unspecified: Secondary | ICD-10-CM | POA: Insufficient documentation

## 2015-02-02 DIAGNOSIS — G8929 Other chronic pain: Secondary | ICD-10-CM | POA: Diagnosis not present

## 2015-02-02 MED ORDER — HYDROCODONE-ACETAMINOPHEN 5-325 MG PO TABS: 1.0000 | ORAL_TABLET | Freq: Three times a day (TID) | ORAL | Status: DC | PRN

## 2015-02-02 NOTE — Patient Instructions (Addendum)
PLEASE CALL ME WITH ANY PROBLEMS OR QUESTIONS (#[phone]).     WORK ON REGULAR CALF STRETCHES!!!

## 2015-02-02 NOTE — Progress Notes (Signed)
Subjective:    Patient ID: Brian Rowe, male    DOB: Mar 15, 1976, 39 y.o.   MRN: [id number]  HPI   Brian Rowe is back regarding his chronic pain. I saw him initially last month. We started tramadol at last visit, but he couldn't tolerate the due irritability and anxiety--(?serotoning effects) so he discarded it. He had a high copay on the voltaren gel, so he didn't get it filled.   He decided not to purchase an ankle gauntlet because of concerns over it being too bulky and having paid a lot of money for the brace he wears.      Pain Inventory Average Pain 8 Pain Right Now 9 My pain is sharp, stabbing and aching  In the last 24 hours, has pain interfered with the following? General activity 4 Relation with others 2 Enjoyment of life 2 What TIME of day is your pain at its worst? evening Sleep (in general) Poor  Pain is worse with: walking, standing and some activites Pain improves with: rest, medication and injections Relief from Meds: 0  Mobility how many minutes can you walk? 15 ability to climb steps?  no do you drive?  yes  Function disabled: date disabled 07/2013 Do you have any goals in this area?  no  Neuro/Psych weakness depression anxiety  Prior Studies Any changes since last visit?  no  Physicians involved in your care Any changes since last visit?  no   Family History  Problem Relation Age of Onset  . Hypertension Father    History   Social History  . Marital Status: Single    Spouse Name: N/A  . Number of Children: N/A  . Years of Education: N/A   Social History Main Topics  . Smoking status: Former Smoker    Quit date: 12/06/2011  . Smokeless tobacco: Not on file  . Alcohol Use: 0.6 oz/week    1 Cans of beer per week  . Drug Use: No  . Sexual Activity: Yes   Other Topics Concern  . None   Social History Narrative   Past Surgical History  Procedure Laterality Date  . Left ankle Left   . Throat surgery    . Nasal septum surgery      Past Medical History  Diagnosis Date  . HYPERLIPIDEMIA 07/19/2007  . ANXIETY 07/19/2007  . DEPRESSION 07/19/2007  . VISUAL IMPAIRMENT 10/15/2007  . HYPERTENSION 07/19/2007  . HEMORRHOIDS, INTERNAL 12/01/2008  . Sleep apnea    BP 131/80 mmHg  Pulse 91  Resp 14  SpO2 96%  Opioid Risk Score:   Fall Risk Score: Moderate Fall Risk (6-13 points) (educated and given handout)  Review of Systems  Gastrointestinal: Positive for constipation.  Neurological: Positive for weakness.  Psychiatric/Behavioral: Positive for dysphoric mood and agitation. The patient is nervous/anxious.   All other systems reviewed and are negative.      Objective:   Physical Exam  General: Alert and oriented x 3, No apparent distress. Obese large framed  HEENT: Head is normocephalic, atraumatic, PERRLA, EOMI, sclera anicteric, oral mucosa pink and moist, dentition intact, ext ear canals clear,  Neck: Supple without JVD or lymphadenopathy  Heart: Reg rate and rhythm. No murmurs rubs or gallops  Chest: CTA bilaterally without wheezes, rales, or rhonchi; no distress  Abdomen: Soft, non-tender, non-distended, bowel sounds positive.  Extremities: No clubbing, cyanosis, or edema. Pulses are 2+  Skin: Clean and intact without signs of breakdown  Neuro: Pt is cognitively appropriate with normal insight,  memory, and awareness. Cranial nerves 2-12 are intact. Sensory exam is normal---no obvious abnormalities in the left foot either. Reflexes are 2+ in all 4's. Fine motor coordination is intact. No tremors. Motor function is grossly 5/5 in all 4's with some pain inhibition weakness in the left foot.  Musculoskeletal: Full ROM, No pain with AROM or PROM in the neck, trunk, or upper extremities. he has a high arch on the left foot. He is tender with palpation over the talus and anterior/lateral left foot inparticular. He also has pain with ankle dorsiflexion, inv, evers. Mild pain over the achilles and calcaneus. Right heel  tender along the posterior calcaneus and achilles insertion once again. Both achilles were tight but feet could be ranged to neutral. He's antalgic with gait more on the left than right. He does use the left hip to help assist swing of the left foot. Standing posture is fair. Some valgus left knee  Psych: Pt's affect is appropriate. Pt is cooperative. Talkative but very pleasant   Assessment & Plan:   1. Chronic foot and ankle pain, left more than right---hx of multiple foot surgeries as a child due to ?devlopmental disorder. 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  2. Mild achilles tendonitis, ?right calcaneal spur  3. Morbid Obesity  4. Anxiety disorder    Plan:  1.Continued weight loss should benefit him more and more. 2. Reviewed stretches today   3. High top shoes or ankle gauntlet might serve him better, but I can understand why he wants to wear his current brace which he spent a lot of money on.   4. Will try low dose hydrocodone 5/325 one q8 prn #60 5. Surgical follow up per Dr. Victorino Dike. Future left ankle/foot fusion still could be in the cards?  6. Follow up with me or NP in about a month. Forty-five minutes of face to face patient care time were spent during this visit. All questions were encouraged and answered.

## 2015-02-23 ENCOUNTER — Telehealth: Payer: Self-pay | Admitting: *Deleted

## 2015-02-23 NOTE — Telephone Encounter (Signed)
Calling to see if he can get a refill on his hydrocodone.  He has appt 03/03/15 with Riley LamEunice.  I looked back and his rx was for #60 q8hr prn.  I offered appt tomorrow or Wednesday but he says he will just make the few pills he has left stretch until the 03/03/15 appt.

## 2015-03-03 ENCOUNTER — Encounter: Payer: BLUE CROSS/BLUE SHIELD | Attending: Physical Medicine & Rehabilitation | Admitting: Registered Nurse

## 2015-03-03 ENCOUNTER — Encounter: Payer: Self-pay | Admitting: Registered Nurse

## 2015-03-03 VITALS — BP 133/68 | HR 82 | Resp 14

## 2015-03-03 DIAGNOSIS — G8929 Other chronic pain: Secondary | ICD-10-CM | POA: Diagnosis present

## 2015-03-03 DIAGNOSIS — M7661 Achilles tendinitis, right leg: Secondary | ICD-10-CM | POA: Diagnosis not present

## 2015-03-03 DIAGNOSIS — Z79899 Other long term (current) drug therapy: Secondary | ICD-10-CM

## 2015-03-03 DIAGNOSIS — M79671 Pain in right foot: Secondary | ICD-10-CM | POA: Diagnosis not present

## 2015-03-03 DIAGNOSIS — M7660 Achilles tendinitis, unspecified leg: Secondary | ICD-10-CM | POA: Insufficient documentation

## 2015-03-03 DIAGNOSIS — Z5181 Encounter for therapeutic drug level monitoring: Secondary | ICD-10-CM

## 2015-03-03 DIAGNOSIS — M25572 Pain in left ankle and joints of left foot: Secondary | ICD-10-CM | POA: Diagnosis not present

## 2015-03-03 DIAGNOSIS — M1990 Unspecified osteoarthritis, unspecified site: Secondary | ICD-10-CM | POA: Insufficient documentation

## 2015-03-03 DIAGNOSIS — M19272 Secondary osteoarthritis, left ankle and foot: Secondary | ICD-10-CM

## 2015-03-03 DIAGNOSIS — F419 Anxiety disorder, unspecified: Secondary | ICD-10-CM | POA: Insufficient documentation

## 2015-03-03 DIAGNOSIS — M25571 Pain in right ankle and joints of right foot: Secondary | ICD-10-CM | POA: Insufficient documentation

## 2015-03-03 DIAGNOSIS — G894 Chronic pain syndrome: Secondary | ICD-10-CM

## 2015-03-03 DIAGNOSIS — M79672 Pain in left foot: Secondary | ICD-10-CM | POA: Diagnosis not present

## 2015-03-03 MED ORDER — HYDROCODONE-ACETAMINOPHEN 5-325 MG PO TABS: 1.0000 | ORAL_TABLET | Freq: Three times a day (TID) | ORAL | Status: DC | PRN

## 2015-03-03 NOTE — Progress Notes (Signed)
Subjective:    Patient ID: Brian Rowe, male    DOB: 09-30-1976, 39 y.o.   MRN: [id number]  HPI: [name] is a 39 year old male who returns for follow up appointment and medication refill. He says his pain is located in his left ankle. He rates his pain 7. His current exercise regime is performing stretching exercises daily and walking short distances. He states the hydrocodone has helped his pain he was taking the medication every 8 hours and began to run short. Increased the hydrocodone tablets this month and explain how the medication can be taken he verbalizes understanding.   Pain Inventory Average Pain 9 Pain Right Now 7 My pain is sharp, stabbing and aching  In the last 24 hours, has pain interfered with the following? General activity 3 Relation with others 2 Enjoyment of life 3 What TIME of day is your pain at its worst? evening Sleep (in general) Poor  Pain is worse with: walking, inactivity, standing and some activites Pain improves with: rest and medication Relief from Meds: 4  Mobility walk without assistance walk with assistance use a cane how many minutes can you walk? 15 ability to climb steps?  no do you drive?  yes  Function disabled: date disabled 09/17/12 I need assistance with the following:  household duties and shopping Do you have any goals in this area?  yes  Neuro/Psych numbness trouble walking depression anxiety  Prior Studies Any changes since last visit?  no  Physicians involved in your care Any changes since last visit?  no   Family History  Problem Relation Age of Onset  . Hypertension Father    History   Social History  . Marital Status: Single    Spouse Name: N/A  . Number of Children: N/A  . Years of Education: N/A   Social History Main Topics  . Smoking status: Former Smoker    Quit date: 12/06/2011  . Smokeless tobacco: Not on file  . Alcohol Use: 0.6 oz/week    1 Cans of beer per week  . Drug  Use: No  . Sexual Activity: Yes   Other Topics Concern  . None   Social History Narrative   Past Surgical History  Procedure Laterality Date  . Left ankle Left   . Throat surgery    . Nasal septum surgery     Past Medical History  Diagnosis Date  . HYPERLIPIDEMIA 07/19/2007  . ANXIETY 07/19/2007  . DEPRESSION 07/19/2007  . VISUAL IMPAIRMENT 10/15/2007  . HYPERTENSION 07/19/2007  . HEMORRHOIDS, INTERNAL 12/01/2008  . Sleep apnea    BP 133/68 mmHg  Pulse 82  Resp 14  SpO2 97%  Opioid Risk Score:   Fall Risk Score: Moderate Fall Risk (6-13 points)`1  Depression screen PHQ 2/9  Depression screen PHQ 2/9 03/03/2015  Decreased Interest 1  Down, Depressed, Hopeless 1  PHQ - 2 Score 2  Altered sleeping 2  Tired, decreased energy 2  Change in appetite 1  Feeling bad or failure about yourself  1  Trouble concentrating 3  Moving slowly or fidgety/restless 0  Suicidal thoughts 0  PHQ-9 Score 11     Review of Systems  Respiratory: Positive for apnea and wheezing.   Cardiovascular: Positive for leg swelling.  Musculoskeletal: Positive for gait problem.  Neurological: Positive for numbness.  Psychiatric/Behavioral: Positive for dysphoric mood. The patient is nervous/anxious.        Objective:   Physical Exam  Constitutional: He  is oriented to person, place, and time. He appears well-developed and well-nourished.  HENT:  Head: Normocephalic and atraumatic.  Neck: Normal range of motion. Neck supple.  Cardiovascular: Normal rate and regular rhythm.   Pulmonary/Chest: Effort normal and breath sounds normal.  Musculoskeletal:  Normal Muscle Bulk and Muscle Testing Reveals: Upper Extremities: Full ROM and Muscle Strength 5/5 Lower Extremities: Right: Full ROM and Muscle Strength 5/5 Left: Decreased ROM and Muscle Strength 4/5 Left Lower Extremity Flexion Produces Pain into ankle Left ankle brace intact Arises from chair with ease Narrow Based gait   Neurological: He  is alert and oriented to person, place, and time.  Skin: Skin is warm and dry.  Psychiatric: He has a normal mood and affect.  Nursing note and vitals reviewed.         Assessment & Plan:  1. Chronic foot and ankle pain, left more than right: hx of multiple foot surgeries as a child due to ?devlopmental disorder. Continue with Daily stretches Refilled Hydrocodone 5/325 mg one tablet every 8 hours as needed. Increased to 75 tablets 2. Mild achilles tendonitis/right calcaneal spur: Follow up with  Dr. Victorino DikeHewitt For left ankle/foot fusion. 3. Morbid Obesity : Continue to Live Healthy Diet and Exercise regime 4. Anxiety disorder: Continue Xanax  20minutes of face to face patient care time was spent during this visit. All questions were encouraged and answered. F/U in 1 month

## 2015-03-28 ENCOUNTER — Emergency Department (HOSPITAL_COMMUNITY)
Admission: EM | Admit: 2015-03-28 | Discharge: 2015-03-28 | Disposition: A | Discharge status: Home / Self Care | Payer: BLUE CROSS/BLUE SHIELD | Source: Home / Self Care | Attending: Family Medicine | Admitting: Family Medicine

## 2015-03-28 ENCOUNTER — Encounter (HOSPITAL_COMMUNITY): Payer: Self-pay | Admitting: Emergency Medicine

## 2015-03-28 DIAGNOSIS — N410 Acute prostatitis: Secondary | ICD-10-CM

## 2015-03-28 LAB — POCT URINALYSIS DIP (DEVICE)
Bilirubin Urine: NEGATIVE
Glucose, UA: NEGATIVE mg/dL
Hgb urine dipstick: NEGATIVE
Ketones, ur: NEGATIVE mg/dL
Leukocytes, UA: NEGATIVE
Nitrite: NEGATIVE
Protein, ur: NEGATIVE mg/dL
Specific Gravity, Urine: 1.02 (ref 1.005–1.030)
Urobilinogen, UA: 1 mg/dL (ref 0.0–1.0)
pH: 7.5 (ref 5.0–8.0)

## 2015-03-28 MED ORDER — CIPROFLOXACIN HCL 500 MG PO TABS: 500.0000 mg | ORAL_TABLET | Freq: Two times a day (BID) | ORAL | Status: DC

## 2015-03-28 NOTE — Discharge Instructions (Signed)
Thank you for coming in today.   Prostatitis The prostate gland is about the size and shape of a walnut. It is located just below your bladder. It produces one of the components of semen, which is made up of sperm and the fluids that help nourish and transport it out from the testicles. Prostatitis is inflammation of the prostate gland.  There are four types of prostatitis:  Acute bacterial prostatitis. This is the least common type of prostatitis. It starts quickly and usually is associated with a bladder infection, high fever, and shaking chills. It can occur at any age.  Chronic bacterial prostatitis. This is a persistent bacterial infection in the prostate. It usually develops from repeated acute bacterial prostatitis or acute bacterial prostatitis that was not properly treated. It can occur in men of any age but is most common in middle-aged men whose prostate has begun to enlarge. The symptoms are not as severe as those in acute bacterial prostatitis. Discomfort in the part of your body that is in front of your rectum and below your scrotum (perineum), lower abdomen, or in the head of your penis (glans) may represent your primary discomfort.  Chronic prostatitis (nonbacterial). This is the most common type of prostatitis. It is inflammation of the prostate gland that is not caused by a bacterial infection. The cause is unknown and may be associated with a viral infection or autoimmune disorder.  Prostatodynia (pelvic floor disorder). This is associated with increased muscular tone in the pelvis surrounding the prostate. CAUSES The causes of bacterial prostatitis are bacterial infection. The causes of the other types of prostatitis are unknown.  SYMPTOMS  Symptoms can vary depending upon the type of prostatitis that exists. There can also be overlap in symptoms. Possible symptoms for each type of prostatitis are listed below. Acute Bacterial Prostatitis  Painful urination.  Fever or  chills.  Muscle or joint pains.  Low back pain.  Low abdominal pain.  Inability to empty bladder completely. Chronic Bacterial Prostatitis, Chronic Nonbacterial Prostatitis, and Prostatodynia  Sudden urge to urinate.  Frequent urination.  Difficulty starting urine stream.  Weak urine stream.  Discharge from the urethra.  Dribbling after urination.  Rectal pain.  Pain in the testicles, penis, or tip of the penis.  Pain in the perineum.  Problems with sexual function.  Painful ejaculation.  Bloody semen. DIAGNOSIS  In order to diagnose prostatitis, your health care provider will ask about your symptoms. One or more urine samples will be taken and tested (urinalysis). If the urinalysis result is negative for bacteria, your health care provider may use a finger to feel your prostate (digital rectal exam). This exam helps your health care provider determine if your prostate is swollen and tender. It will also produce a specimen of semen that can be analyzed. TREATMENT  Treatment for prostatitis depends on the cause. If a bacterial infection is the cause, it can be treated with antibiotic medicine. In cases of chronic bacterial prostatitis, the use of antibiotics for up to 1 month or 6 weeks may be necessary. Your health care provider may instruct you to take sitz baths to help relieve pain. A sitz bath is a bath of hot water in which your hips and buttocks are under water. This relaxes the pelvic floor muscles and often helps to relieve the pressure on your prostate. HOME CARE INSTRUCTIONS   Take all medicines as directed by your health care provider.  Take sitz baths as directed by your health care provider.  SEEK MEDICAL CARE IF:   Your symptoms get worse, not better.  You have a fever. SEEK IMMEDIATE MEDICAL CARE IF:   You have chills.  You feel nauseous or vomit.  You feel lightheaded or faint.  You are unable to urinate.  You have blood or blood clots in your  urine. MAKE SURE YOU:  Understand these instructions.  Will watch your condition.  Will get help right away if you are not doing well or get worse. Document Released: 11/18/2000 Document Revised: 11/26/2013 Document Reviewed: 06/10/2013 [facility] Patient Information 2015 [place], [place]. This information is not intended to replace advice given to you by your health care provider. Make sure you discuss any questions you have with your health care provider.

## 2015-03-28 NOTE — ED Notes (Signed)
Reports urinary incontinence. Urgency.  Lower back pain.   For the past 2 to 3 weeks.  Denies fever, n/v.  Mild diarrhea.   Pt has increased water intake with no relief in symptoms.

## 2015-03-28 NOTE — ED Provider Notes (Signed)
Brian Rowe is a 39 y.o. male who presents to Urgent Care today for prostatitis symptoms. Patient has a 3 week history of pelvic pain and pressure associated with urinary frequency and urgency. He has a history of prostatitis and notes his symptoms are currently consistent with previous episodes of prostatitis. He has not tried any medications. No fevers or chills vomiting or diarrhea. He would like to avoid a rectal exam today if possible.   Past Medical History  Diagnosis Date  . HYPERLIPIDEMIA 07/19/2007  . ANXIETY 07/19/2007  . DEPRESSION 07/19/2007  . VISUAL IMPAIRMENT 10/15/2007  . HYPERTENSION 07/19/2007  . HEMORRHOIDS, INTERNAL 12/01/2008  . Sleep apnea    Past Surgical History  Procedure Laterality Date  . Left ankle Left   . Throat surgery    . Nasal septum surgery     History  Substance Use Topics  . Smoking status: Former Smoker    Quit date: 12/06/2011  . Smokeless tobacco: Not on file  . Alcohol Use: 0.6 oz/week    1 Cans of beer per week   ROS as above Medications: No current facility-administered medications for this encounter.   Current Outpatient Prescriptions  Medication Sig Dispense Refill  . alprazolam (XANAX) 2 MG tablet Take 2 mg by mouth 3 (three) times daily as needed for sleep.    Marland Kitchen amphetamine-dextroamphetamine (ADDERALL XR) 30 MG 24 hr capsule Take 30 mg by mouth as needed.    . cefdinir (OMNICEF) 300 MG capsule Take 300 mg by mouth 2 (two) times daily.     . cyclobenzaprine (FLEXERIL) 10 MG tablet 1/2 - 1 by mouth qhs as needed     . DULoxetine (CYMBALTA) 30 MG capsule Take 30 mg by mouth daily.     . valACYclovir (VALTREX) 500 MG tablet Take 1 tablet (500 mg total) by mouth 2 (two) times daily. 20 tablet 1  . ciprofloxacin (CIPRO) 500 MG tablet Take 1 tablet (500 mg total) by mouth 2 (two) times daily. 28 tablet 0  . HYDROcodone-acetaminophen (NORCO/VICODIN) 5-325 MG per tablet Take 1 tablet by mouth every 8 (eight) hours as needed for moderate  pain. 75 tablet 0  . meloxicam (MOBIC) 15 MG tablet Take 1 tablet (15 mg total) by mouth daily. 30 tablet 1   Allergies  Allergen Reactions  . Tramadol     ?serotonin syndrome     Exam:  BP 138/97 mmHg  Pulse 74  Temp(Src) 98.3 F (36.8 C) (Oral)  Resp 12  SpO2 99% Gen: Well NAD HEENT: EOMI,  MMM Lungs: Normal work of breathing. CTABL Heart: RRR no MRG Abd: NABS, Soft. Nondistended, Nontender Exts: Brisk capillary refill, warm and well perfused.   Results for orders placed or performed during the hospital encounter of 03/28/15 (from the past 24 hour(s))  POCT urinalysis dip (device)     Status: None   Collection Time: 03/28/15  6:58 PM  Result Value Ref Range   Glucose, UA NEGATIVE NEGATIVE mg/dL   Bilirubin Urine NEGATIVE NEGATIVE   Ketones, ur NEGATIVE NEGATIVE mg/dL   Specific Gravity, Urine 1.020 1.005 - 1.030   Hgb urine dipstick NEGATIVE NEGATIVE   pH 7.5 5.0 - 8.0   Protein, ur NEGATIVE NEGATIVE mg/dL   Urobilinogen, UA 1.0 0.0 - 1.0 mg/dL   Nitrite NEGATIVE NEGATIVE   Leukocytes, UA NEGATIVE NEGATIVE   No results found.  Assessment and Plan: 39 y.o. male with probable prostatitis. Culture pending treat with Cipro follow-up with urology.  Discussed warning signs  or symptoms. Please see discharge instructions. Patient expresses understanding.     Rodolph BongEvan S Corey, MD 03/28/15 [phone]

## 2015-03-30 LAB — URINE CULTURE
Colony Count: NO GROWTH
Culture: NO GROWTH
Special Requests: NORMAL

## 2015-03-31 ENCOUNTER — Encounter: Payer: Self-pay | Admitting: Registered Nurse

## 2015-03-31 ENCOUNTER — Encounter: Payer: BLUE CROSS/BLUE SHIELD | Attending: Physical Medicine & Rehabilitation | Admitting: Registered Nurse

## 2015-03-31 ENCOUNTER — Other Ambulatory Visit: Payer: Self-pay | Admitting: Registered Nurse

## 2015-03-31 VITALS — BP 126/79 | HR 83 | Resp 14

## 2015-03-31 DIAGNOSIS — M79672 Pain in left foot: Secondary | ICD-10-CM | POA: Insufficient documentation

## 2015-03-31 DIAGNOSIS — Z79899 Other long term (current) drug therapy: Secondary | ICD-10-CM

## 2015-03-31 DIAGNOSIS — M25571 Pain in right ankle and joints of right foot: Secondary | ICD-10-CM | POA: Insufficient documentation

## 2015-03-31 DIAGNOSIS — M1990 Unspecified osteoarthritis, unspecified site: Secondary | ICD-10-CM | POA: Insufficient documentation

## 2015-03-31 DIAGNOSIS — M79671 Pain in right foot: Secondary | ICD-10-CM | POA: Diagnosis not present

## 2015-03-31 DIAGNOSIS — M7661 Achilles tendinitis, right leg: Secondary | ICD-10-CM

## 2015-03-31 DIAGNOSIS — M19272 Secondary osteoarthritis, left ankle and foot: Secondary | ICD-10-CM

## 2015-03-31 DIAGNOSIS — M25572 Pain in left ankle and joints of left foot: Secondary | ICD-10-CM | POA: Insufficient documentation

## 2015-03-31 DIAGNOSIS — F419 Anxiety disorder, unspecified: Secondary | ICD-10-CM | POA: Insufficient documentation

## 2015-03-31 DIAGNOSIS — G894 Chronic pain syndrome: Secondary | ICD-10-CM | POA: Diagnosis not present

## 2015-03-31 DIAGNOSIS — G8929 Other chronic pain: Secondary | ICD-10-CM | POA: Diagnosis not present

## 2015-03-31 DIAGNOSIS — Z5181 Encounter for therapeutic drug level monitoring: Secondary | ICD-10-CM

## 2015-03-31 DIAGNOSIS — M7660 Achilles tendinitis, unspecified leg: Secondary | ICD-10-CM | POA: Insufficient documentation

## 2015-03-31 MED ORDER — HYDROCODONE-ACETAMINOPHEN 5-325 MG PO TABS: 1.0000 | ORAL_TABLET | Freq: Three times a day (TID) | ORAL | Status: DC | PRN

## 2015-03-31 NOTE — Progress Notes (Signed)
Subjective:    Patient ID: Brian Rowe, male    DOB: [birth year], 39 y.o.   MRN: [id number]  HPI: Brian Rowe is a 39 year old male who returns for follow up appointment and medication refill. He says his pain is located in his left ankle. He rates his pain 9. His current exercise regime is performing stretching exercises daily and walking short distances. He states the hydrocodone is offering him minimal relief of his pain requesting an alternative. I will speak to Dr. [name] and give him a call he verbalizes understanding.  Pain Inventory Average Pain 8 Pain Right Now 9 My pain is sharp, stabbing and aching  In the last 24 hours, has pain interfered with the following? General activity 3 Relation with others 2 Enjoyment of life 1 What TIME of day is your pain at its worst? evening Sleep (in general) NA  Pain is worse with: walking and sitting Pain improves with: rest and medication Relief from Meds: 2  Mobility walk without assistance how many minutes can you walk? 15 ability to climb steps?  no do you drive?  yes  Function disabled: date disabled in process  Neuro/Psych bladder control problems numbness anxiety  Prior Studies Any changes since last visit?  no  Physicians involved in your care Any changes since last visit?  no   Family History  Problem Relation Age of Onset  . Hypertension Father    History   Social History  . Marital Status: Single    Spouse Name: N/A  . Number of Children: N/A  . Years of Education: N/A   Social History Main Topics  . Smoking status: Former Smoker    Quit date: 12/06/2011  . Smokeless tobacco: Not on file  . Alcohol Use: 0.6 oz/week    1 Cans of beer per week  . Drug Use: No  . Sexual Activity: Yes   Other Topics Concern  . None   Social History Narrative   Past Surgical History  Procedure Laterality Date  . Left ankle Left   . Throat surgery    . Nasal septum surgery     Past Medical  History  Diagnosis Date  . HYPERLIPIDEMIA 07/19/2007  . ANXIETY 07/19/2007  . DEPRESSION 07/19/2007  . VISUAL IMPAIRMENT 10/15/2007  . HYPERTENSION 07/19/2007  . HEMORRHOIDS, INTERNAL 12/01/2008  . Sleep apnea    BP 126/79 mmHg  Pulse 83  Resp 14  SpO2 96%  Opioid Risk Score:   Fall Risk Score: Moderate Fall Risk (6-13 points) (previously educated and given handout on 02/02/15)`1  Depression screen PHQ 2/9  Depression screen PHQ 2/9 03/03/2015  Decreased Interest 1  Down, Depressed, Hopeless 1  PHQ - 2 Score 2  Altered sleeping 2  Tired, decreased energy 2  Change in appetite 1  Feeling bad or failure about yourself  1  Trouble concentrating 3  Moving slowly or fidgety/restless 0  Suicidal thoughts 0  PHQ-9 Score 11    Review of Systems  Neurological: Positive for numbness.  Psychiatric/Behavioral: The patient is nervous/anxious.   All other systems reviewed and are negative.      Objective:   Physical Exam  Constitutional: He is oriented to person, place, and time. He appears well-developed and well-nourished.  HENT:  Head: Normocephalic and atraumatic.  Neck: Normal range of motion. Neck supple.  Cardiovascular: Normal rate and regular rhythm.   Pulmonary/Chest: Effort normal and breath sounds normal.  Musculoskeletal:  Normal Muscle Bulk and  Muscle Testing Reveals: Upper Extremities: Full ROM and Muscle Strength 5/5 Thoracic Hypersensitivity: T-4- T-12 ( Left Side)  Lumbar Paraspinal Tenderness: L-3- L-5 Lower Extremities: Full ROM and Muscle Strength 5/5 on the right and Left Muscle strength 4/5 Left Lower Extremity Flexion Produces pain into Left ankle Left Brace Intact Arises from chair with ease Narrow Based Gait  Neurological: He is alert and oriented to person, place, and time.  Skin: Skin is warm and dry.  Psychiatric: He has a normal mood and affect.  Nursing note and vitals reviewed.         Assessment & Plan:  1. Chronic foot and ankle  pain, left more than right: hx of multiple foot surgeries as a child due to ?devlopmental disorder. Continue with Daily stretches Refilled Hydrocodone 5/325 mg one tablet every 8 hours as needed. #75 2. Mild achilles tendonitis/right calcaneal spur: Follow up with Dr. Victorino Dike For left ankle/foot fusion. 3. Morbid Obesity : Continue to Live Healthy Diet and Exercise regime 4. Anxiety disorder: Continue Xanax  of face to face patient care time was spent during this visit. All questions were encouraged and answered. F/U in 1 month

## 2015-04-01 ENCOUNTER — Telehealth: Payer: Self-pay | Admitting: *Deleted

## 2015-04-01 LAB — PMP ALCOHOL METABOLITE (ETG)

## 2015-04-01 NOTE — Telephone Encounter (Signed)
Brian Rowe called asking about his medication hydrocodone.  I think he is saying [name] was going to find out about an increase or change and he needs to know the outcome or if he should fill the rx he has. Please call.

## 2015-04-01 NOTE — Telephone Encounter (Signed)
Attempted to call Mr.  [name]. Left message to take medication as prescribed.  i will speak to Dr. [name] next week.

## 2015-04-03 LAB — OPIATES/OPIOIDS (LC/MS-MS)
Codeine Urine: NEGATIVE ng/mL (ref <50)
Hydrocodone: 1908 ng/mL (ref <50)
Hydromorphone: 257 ng/mL (ref <50)
Morphine Urine: NEGATIVE ng/mL (ref <50)
Norhydrocodone, Ur: 2873 ng/mL (ref <50)
Noroxycodone, Ur: NEGATIVE ng/mL (ref <50)
Oxycodone, ur: NEGATIVE ng/mL (ref <50)
Oxymorphone: NEGATIVE ng/mL (ref <50)

## 2015-04-03 LAB — BENZODIAZEPINES (GC/LC/MS), URINE
Alprazolam metabolite (GC/LC/MS), ur confirm: 2340 ng/mL (ref <25)
Clonazepam metabolite (GC/LC/MS), ur confirm: NEGATIVE ng/mL (ref <25)
Flurazepam metabolite (GC/LC/MS), ur confirm: NEGATIVE ng/mL (ref <50)
Lorazepam (GC/LC/MS), ur confirm: NEGATIVE ng/mL (ref <50)
Midazolam (GC/LC/MS), ur confirm: NEGATIVE ng/mL (ref <50)
Nordiazepam (GC/LC/MS), ur confirm: NEGATIVE ng/mL (ref <50)
Oxazepam (GC/LC/MS), ur confirm: NEGATIVE ng/mL (ref <50)
Temazepam (GC/LC/MS), ur confirm: NEGATIVE ng/mL (ref <50)
Triazolam metabolite (GC/LC/MS), ur confirm: NEGATIVE ng/mL (ref <50)

## 2015-04-03 LAB — AMPHETAMINES (GC/LC/MS), URINE
Amphetamine GC/MS Conf: 20199 ng/mL — AB (ref <250)
Methamphetamine Quant, Ur: NEGATIVE ng/mL (ref <250)

## 2015-04-03 LAB — ETHYL GLUCURONIDE, URINE
Ethyl Glucuronide (EtG): 1001 ng/mL — ABNORMAL HIGH (ref <500)
Ethyl Sulfate (ETS): 280 ng/mL — ABNORMAL HIGH (ref <100)

## 2015-04-04 LAB — PRESCRIPTION MONITORING PROFILE (SOLSTAS)
Barbiturate Screen, Urine: NEGATIVE ng/mL
Buprenorphine, Urine: NEGATIVE ng/mL
Cannabinoid Scrn, Ur: NEGATIVE ng/mL
Carisoprodol, Urine: NEGATIVE ng/mL
Cocaine Metabolites: NEGATIVE ng/mL
Creatinine, Urine: 630.44 mg/dL (ref >20.0)
Fentanyl, Ur: NEGATIVE ng/mL
MDMA URINE: NEGATIVE ng/mL
Meperidine, Ur: NEGATIVE ng/mL
Methadone Screen, Urine: NEGATIVE ng/mL
Nitrites, Initial: NEGATIVE ug/mL
Oxycodone Screen, Ur: NEGATIVE ng/mL
Propoxyphene: NEGATIVE ng/mL
Tapentadol, urine: NEGATIVE ng/mL
Tramadol Scrn, Ur: NEGATIVE ng/mL
Zolpidem, Urine: NEGATIVE ng/mL
pH, Initial: 5.7 pH (ref 4.5–8.9)

## 2015-04-20 ENCOUNTER — Telehealth: Payer: Self-pay | Admitting: *Deleted

## 2015-04-20 NOTE — Telephone Encounter (Signed)
Brian Rowe called because he has been prescribed the same medication that is not working for him.  [name] was supposed to get with Dr [name] to see if it could be changed and he has not heard anything.  He has an appt tomorrow and because of the expense of coming here he does not want to come in to get the same thing that is not working for him. He wants to know something before he has to come to the appt.  Unfortunately [name] is not here today and his appt tomorrow is 9:00.  We will not be able to address this before he comes in.

## 2015-04-21 ENCOUNTER — Encounter: Payer: BLUE CROSS/BLUE SHIELD | Attending: Physical Medicine & Rehabilitation | Admitting: Registered Nurse

## 2015-04-21 ENCOUNTER — Encounter: Payer: Self-pay | Admitting: Registered Nurse

## 2015-04-21 VITALS — BP 128/82 | HR 96 | Resp 16

## 2015-04-21 DIAGNOSIS — Z79899 Other long term (current) drug therapy: Secondary | ICD-10-CM | POA: Diagnosis not present

## 2015-04-21 DIAGNOSIS — G894 Chronic pain syndrome: Secondary | ICD-10-CM

## 2015-04-21 DIAGNOSIS — M79672 Pain in left foot: Secondary | ICD-10-CM | POA: Insufficient documentation

## 2015-04-21 DIAGNOSIS — M19272 Secondary osteoarthritis, left ankle and foot: Secondary | ICD-10-CM

## 2015-04-21 DIAGNOSIS — M25572 Pain in left ankle and joints of left foot: Secondary | ICD-10-CM | POA: Insufficient documentation

## 2015-04-21 DIAGNOSIS — M7660 Achilles tendinitis, unspecified leg: Secondary | ICD-10-CM | POA: Diagnosis not present

## 2015-04-21 DIAGNOSIS — M1990 Unspecified osteoarthritis, unspecified site: Secondary | ICD-10-CM | POA: Insufficient documentation

## 2015-04-21 DIAGNOSIS — F419 Anxiety disorder, unspecified: Secondary | ICD-10-CM | POA: Diagnosis not present

## 2015-04-21 DIAGNOSIS — M79671 Pain in right foot: Secondary | ICD-10-CM | POA: Insufficient documentation

## 2015-04-21 DIAGNOSIS — Z5181 Encounter for therapeutic drug level monitoring: Secondary | ICD-10-CM

## 2015-04-21 DIAGNOSIS — M25571 Pain in right ankle and joints of right foot: Secondary | ICD-10-CM | POA: Insufficient documentation

## 2015-04-21 DIAGNOSIS — G8929 Other chronic pain: Secondary | ICD-10-CM | POA: Insufficient documentation

## 2015-04-21 MED ORDER — HYDROCODONE-ACETAMINOPHEN 7.5-325 MG PO TABS: 1.0000 | ORAL_TABLET | Freq: Three times a day (TID) | ORAL | Status: DC | PRN

## 2015-04-21 NOTE — Progress Notes (Signed)
Subjective:    Patient ID: Brian Rowe, male    DOB: [birth year], 39 y.o.   MRN: [id number]  HPI: Brian Rowe is a 39 year old male who returns for follow up appointment and medication refill. He stateshis pain has intensified in his left ankle and the hydrocodone 5/325 has not been effective with pain relief. Also complaining of left knee pain.Also states he tripped over a dog bone tewo days ago and fell on his left knee he was able to get up. He didn't seek medical attention. He rates his pain 9. His current exercise regime is performing stretching exercises daily and walking short distances.   Pain Inventory Average Pain 9 Pain Right Now 9 My pain is intermittent  In the last 24 hours, has pain interfered with the following? General activity 2 Relation with others 1 Enjoyment of life 1 What TIME of day is your pain at its worst? evening Sleep (in general) Poor  Pain is worse with: walking, bending, inactivity, standing and some activites Pain improves with: rest, therapy/exercise, medication and injections Relief from Meds: 2  Mobility use a cane how many minutes can you walk? 15 ability to climb steps?  no do you drive?  yes  Function disabled: date disabled .  Neuro/Psych depression anxiety  Prior Studies Any changes since last visit?  no  Physicians involved in your care Any changes since last visit?  no   Family History  Problem Relation Age of Onset  . Hypertension Father    History   Social History  . Marital Status: Single    Spouse Name: N/A  . Number of Children: N/A  . Years of Education: N/A   Social History Main Topics  . Smoking status: Former Smoker    Quit date: 12/06/2011  . Smokeless tobacco: Not on file  . Alcohol Use: 0.6 oz/week    1 Cans of beer per week  . Drug Use: No  . Sexual Activity: Yes   Other Topics Concern  . None   Social History Narrative   Past Surgical History  Procedure Laterality Date  . Left  ankle Left   . Throat surgery    . Nasal septum surgery     Past Medical History  Diagnosis Date  . HYPERLIPIDEMIA 07/19/2007  . ANXIETY 07/19/2007  . DEPRESSION 07/19/2007  . VISUAL IMPAIRMENT 10/15/2007  . HYPERTENSION 07/19/2007  . HEMORRHOIDS, INTERNAL 12/01/2008  . Sleep apnea    BP 128/82 mmHg  Pulse 96  Resp 16  SpO2 96%  Opioid Risk Score:   Fall Risk Score: Moderate Fall Risk (6-13 points) (re educated and given handout again on fall prevention)`1  Depression screen PHQ 2/9  Depression screen PHQ 2/9 03/03/2015  Decreased Interest 1  Down, Depressed, Hopeless 1  PHQ - 2 Score 2  Altered sleeping 2  Tired, decreased energy 2  Change in appetite 1  Feeling bad or failure about yourself  1  Trouble concentrating 3  Moving slowly or fidgety/restless 0  Suicidal thoughts 0  PHQ-9 Score 11     Review of Systems  Psychiatric/Behavioral: Positive for dysphoric mood. The patient is nervous/anxious.   All other systems reviewed and are negative.      Objective:   Physical Exam  Constitutional: He is oriented to person, place, and time. He appears well-developed and well-nourished.  HENT:  Head: Normocephalic and atraumatic.  Neck: Normal range of motion. Neck supple.  Cardiovascular: Normal rate and regular rhythm.  Pulmonary/Chest: Effort normal and breath sounds normal.  Musculoskeletal:  Normal Muscle Bulk and Muscle Testing Reveals: Upper Extremities: Upper Extremities: Full ROM and Muscle Strength 5/5 Lumbar Paraspinal Tenderness: L-3-L-4 Lower Extremities: Full ROM and Muscle Strength 5/5 Left Ankle Brace Intact. Arises from chair with ease Antalgic Gait  Neurological: He is alert and oriented to person, place, and time.  Skin: Skin is warm and dry.  Psychiatric: He has a normal mood and affect.  Nursing note and vitals reviewed.         Assessment & Plan:  1. Chronic foot and ankle pain, left more than right: hx of multiple foot surgeries as a  child due to ?devlopmental disorder. Continue with Daily stretches RX: Increased to Hydrocodone 7.5/325 mg one tablet every 8 hours as needed. #80 2. Mild achilles tendonitis/right calcaneal spur: Follow up with Dr. Victorino DikeHewitt For left ankle/foot fusion. On hold due to financial hardship. 3. Morbid Obesity : Continue to Live Healthy Diet and Exercise regime 4. Anxiety disorder: Continue Xanax  20minutes of face to face patient care time was spent during this visit. All questions were encouraged and answered . F/U in 1 month

## 2015-04-21 NOTE — Progress Notes (Signed)
Urine drug screen for this encounter is consistent for prescribed medication plus + for alcohol.  ( hx says he has 1 can of beer per week)

## 2015-04-22 ENCOUNTER — Telehealth: Payer: Self-pay | Admitting: Registered Nurse

## 2015-04-22 ENCOUNTER — Telehealth: Payer: Self-pay | Admitting: *Deleted

## 2015-04-22 NOTE — Telephone Encounter (Signed)
Placed a call to Mr. [name] regarding his UDS, awaiting a return call.

## 2015-04-22 NOTE — Telephone Encounter (Signed)
error 

## 2015-04-22 NOTE — Telephone Encounter (Signed)
Mr Brian Rowe called back and I told him we called to remind Mr Brian Rowe that he had alcohol in his last UDS and we discourage the use of alcohol and narcotics.  He says he rarely has a drink and it is only a single beer if he does so that will not be a problem.

## 2015-05-12 ENCOUNTER — Telehealth: Payer: Self-pay | Admitting: Emergency Medicine

## 2015-05-12 DIAGNOSIS — B009 Herpesviral infection, unspecified: Secondary | ICD-10-CM

## 2015-05-12 NOTE — Telephone Encounter (Signed)
Patient would like refill on Valtex.  Patient stated he is having symptoms

## 2015-05-14 MED ORDER — VALACYCLOVIR HCL 500 MG PO TABS: 500.0000 mg | ORAL_TABLET | Freq: Two times a day (BID) | ORAL | Status: DC

## 2015-05-14 NOTE — Addendum Note (Signed)
Addended by: Dennison Mascot on: 05/14/2015 07:41 AM   Modules accepted: Orders

## 2015-05-19 ENCOUNTER — Encounter: Payer: Self-pay | Admitting: Registered Nurse

## 2015-05-19 ENCOUNTER — Encounter: Payer: BLUE CROSS/BLUE SHIELD | Attending: Physical Medicine & Rehabilitation | Admitting: Registered Nurse

## 2015-05-19 VITALS — BP 125/81 | HR 78 | Resp 14

## 2015-05-19 DIAGNOSIS — Z5181 Encounter for therapeutic drug level monitoring: Secondary | ICD-10-CM | POA: Diagnosis not present

## 2015-05-19 DIAGNOSIS — F419 Anxiety disorder, unspecified: Secondary | ICD-10-CM | POA: Diagnosis not present

## 2015-05-19 DIAGNOSIS — M25572 Pain in left ankle and joints of left foot: Secondary | ICD-10-CM | POA: Insufficient documentation

## 2015-05-19 DIAGNOSIS — M79671 Pain in right foot: Secondary | ICD-10-CM | POA: Diagnosis not present

## 2015-05-19 DIAGNOSIS — Z79899 Other long term (current) drug therapy: Secondary | ICD-10-CM

## 2015-05-19 DIAGNOSIS — M79672 Pain in left foot: Secondary | ICD-10-CM | POA: Diagnosis not present

## 2015-05-19 DIAGNOSIS — M7660 Achilles tendinitis, unspecified leg: Secondary | ICD-10-CM | POA: Insufficient documentation

## 2015-05-19 DIAGNOSIS — M19272 Secondary osteoarthritis, left ankle and foot: Secondary | ICD-10-CM

## 2015-05-19 DIAGNOSIS — G8929 Other chronic pain: Secondary | ICD-10-CM | POA: Insufficient documentation

## 2015-05-19 DIAGNOSIS — G894 Chronic pain syndrome: Secondary | ICD-10-CM | POA: Diagnosis not present

## 2015-05-19 DIAGNOSIS — M25571 Pain in right ankle and joints of right foot: Secondary | ICD-10-CM | POA: Insufficient documentation

## 2015-05-19 DIAGNOSIS — M1990 Unspecified osteoarthritis, unspecified site: Secondary | ICD-10-CM | POA: Diagnosis not present

## 2015-05-19 MED ORDER — HYDROCODONE-ACETAMINOPHEN 7.5-325 MG PO TABS: 1.0000 | ORAL_TABLET | Freq: Three times a day (TID) | ORAL | Status: DC | PRN

## 2015-05-19 NOTE — Progress Notes (Signed)
Subjective:    Patient ID: Brian Rowe, male    DOB: Sep 13, 1976, 39 y.o.   MRN: [id number]  HPI: [name] is a 39 year old male who returns for follow up appointment and medication refill. He says his pain is located in his left ankle. He rates his pain 5. His current exercise regime is performing stretching exercises daily and walking short distances.   Mr. Zietlow is having financial hardship, he had his disability hearing awaiting a response. We will allow him to follow up in 2 month's , he verbalizes understanding. Second script given and post dated.  Pain Inventory Average Pain 8 Pain Right Now 5 My pain is sharp, stabbing and aching  In the last 24 hours, has pain interfered with the following? General activity 2 Relation with others 2 Enjoyment of life 1 What TIME of day is your pain at its worst? daytime Sleep (in general) Poor  Pain is worse with: walking, bending, inactivity, standing and some activites Pain improves with: rest, medication and injections Relief from Meds: 5  Mobility walk without assistance walk with assistance use a cane how many minutes can you walk? 15 ability to climb steps?  no do you drive?  yes  Function disabled: date disabled . I need assistance with the following:  household duties  Neuro/Psych depression anxiety  Prior Studies Any changes since last visit?  no  Physicians involved in your care Any changes since last visit?  no   Family History  Problem Relation Age of Onset  . Hypertension Father    History   Social History  . Marital Status: Single    Spouse Name: N/A  . Number of Children: N/A  . Years of Education: N/A   Social History Main Topics  . Smoking status: Former Smoker    Quit date: 12/06/2011  . Smokeless tobacco: Not on file  . Alcohol Use: 0.6 oz/week    1 Cans of beer per week  . Drug Use: No  . Sexual Activity: Yes   Other Topics Concern  . None   Social History Narrative     Past Surgical History  Procedure Laterality Date  . Left ankle Left   . Throat surgery    . Nasal septum surgery     Past Medical History  Diagnosis Date  . HYPERLIPIDEMIA 07/19/2007  . ANXIETY 07/19/2007  . DEPRESSION 07/19/2007  . VISUAL IMPAIRMENT 10/15/2007  . HYPERTENSION 07/19/2007  . HEMORRHOIDS, INTERNAL 12/01/2008  . Sleep apnea    BP 125/81 mmHg  Pulse 78  Resp 14  SpO2 92%  Opioid Risk Score:   Fall Risk Score: Moderate Fall Risk (6-13 points)`1  Depression screen PHQ 2/9  Depression screen PHQ 2/9 03/03/2015  Decreased Interest 1  Down, Depressed, Hopeless 1  PHQ - 2 Score 2  Altered sleeping 2  Tired, decreased energy 2  Change in appetite 1  Feeling bad or failure about yourself  1  Trouble concentrating 3  Moving slowly or fidgety/restless 0  Suicidal thoughts 0  PHQ-9 Score 11    2 Review of Systems  All other systems reviewed and are negative.      Objective:   Physical Exam  Constitutional: He is oriented to person, place, and time. He appears well-developed and well-nourished.  HENT:  Head: Normocephalic and atraumatic.  Neck: Normal range of motion. Neck supple.  Cardiovascular: Normal rate and regular rhythm.   Pulmonary/Chest: Effort normal and breath sounds normal.  Musculoskeletal:  Normal Muscle Bulk and Muscle Testing Reveals: Upper Extremities: Full ROM and Muscle Strength 5/5 Lower Extremities: Full ROM and Muscle Strength 5/5 Left AFO Arises from chair with ease Narrow based Gait  Neurological: He is alert and oriented to person, place, and time.  Skin: Skin is warm and dry.  Psychiatric: He has a normal mood and affect.  Nursing note and vitals reviewed.         Assessment & Plan:  1. Chronic foot and ankle pain, left more than right: hx of multiple foot surgeries as a child due to ?devlopmental disorder. Continue with Daily stretches Refilled: Hydrocodone 7.5/325 mg one tablet every 8 hours as needed. #80 2. Mild  achilles tendonitis/right calcaneal spur: Follow up with Dr. Victorino Dike For left ankle/foot fusion. On hold due to financial hardship. 3. Morbid Obesity : Continue to Live Healthy Diet and Exercise regime 4. Anxiety disorder: Continue Xanax  of face to face patient care time was spent during this visit. All questions were encouraged and answered . F/U in 2 month

## 2015-05-21 ENCOUNTER — Encounter: Payer: Self-pay | Admitting: Internal Medicine

## 2015-05-21 ENCOUNTER — Ambulatory Visit (INDEPENDENT_AMBULATORY_CARE_PROVIDER_SITE_OTHER): Payer: BLUE CROSS/BLUE SHIELD | Admitting: Internal Medicine

## 2015-05-21 ENCOUNTER — Other Ambulatory Visit (INDEPENDENT_AMBULATORY_CARE_PROVIDER_SITE_OTHER): Payer: BLUE CROSS/BLUE SHIELD

## 2015-05-21 VITALS — BP 110/76 | HR 98 | Temp 98.5°F | Resp 20 | Ht 75.0 in | Wt 318.0 lb

## 2015-05-21 DIAGNOSIS — T148 Other injury of unspecified body region: Secondary | ICD-10-CM

## 2015-05-21 DIAGNOSIS — W57XXXA Bitten or stung by nonvenomous insect and other nonvenomous arthropods, initial encounter: Secondary | ICD-10-CM | POA: Diagnosis not present

## 2015-05-21 DIAGNOSIS — R7989 Other specified abnormal findings of blood chemistry: Secondary | ICD-10-CM | POA: Diagnosis not present

## 2015-05-21 DIAGNOSIS — Z0000 Encounter for general adult medical examination without abnormal findings: Secondary | ICD-10-CM | POA: Diagnosis not present

## 2015-05-21 LAB — BASIC METABOLIC PANEL
BUN: 14 mg/dL (ref 6–23)
CO2: 28 mEq/L (ref 19–32)
Calcium: 9.4 mg/dL (ref 8.4–10.5)
Chloride: 102 mEq/L (ref 96–112)
Creatinine, Ser: 1.15 mg/dL (ref 0.40–1.50)
GFR: 75.85 mL/min (ref >60.00)
Glucose, Bld: 97 mg/dL (ref 70–99)
Potassium: 4.3 mEq/L (ref 3.5–5.1)
Sodium: 136 mEq/L (ref 135–145)

## 2015-05-21 LAB — POCT URINALYSIS DIPSTICK
Bilirubin, UA: NEGATIVE
Blood, UA: NEGATIVE
Glucose, UA: NEGATIVE
Ketones, UA: NEGATIVE
Leukocytes, UA: NEGATIVE
Nitrite, UA: NEGATIVE
Protein, UA: NEGATIVE
Spec Grav, UA: 1.025
Urobilinogen, UA: 0.2
pH, UA: 5

## 2015-05-21 LAB — LIPID PANEL
Cholesterol: 210 mg/dL — ABNORMAL HIGH (ref 0–200)
HDL: 33.7 mg/dL — ABNORMAL LOW (ref >39.00)
NonHDL: 176.3
Total CHOL/HDL Ratio: 6
Triglycerides: 244 mg/dL — ABNORMAL HIGH (ref 0.0–149.0)
VLDL: 48.8 mg/dL — ABNORMAL HIGH (ref 0.0–40.0)

## 2015-05-21 LAB — CBC WITH DIFFERENTIAL/PLATELET
Basophils Absolute: 0 10*3/uL (ref 0.0–0.1)
Basophils Relative: 0.9 % (ref 0.0–3.0)
Eosinophils Absolute: 0.2 10*3/uL (ref 0.0–0.7)
Eosinophils Relative: 3.8 % (ref 0.0–5.0)
HCT: 48.9 % (ref 39.0–52.0)
Hemoglobin: 16.8 g/dL (ref 13.0–17.0)
Lymphocytes Relative: 27 % (ref 12.0–46.0)
Lymphs Abs: 1.3 10*3/uL (ref 0.7–4.0)
MCHC: 34.4 g/dL (ref 30.0–36.0)
MCV: 92.5 fl (ref 78.0–100.0)
Monocytes Absolute: 0.7 10*3/uL (ref 0.1–1.0)
Monocytes Relative: 14.5 % — ABNORMAL HIGH (ref 3.0–12.0)
Neutro Abs: 2.6 10*3/uL (ref 1.4–7.7)
Neutrophils Relative %: 53.8 % (ref 43.0–77.0)
Platelets: 161 10*3/uL (ref 150.0–400.0)
RBC: 5.29 Mil/uL (ref 4.22–5.81)
RDW: 13.8 % (ref 11.5–15.5)
WBC: 4.8 10*3/uL (ref 4.0–10.5)

## 2015-05-21 LAB — HEPATIC FUNCTION PANEL
ALT: 45 U/L (ref 0–53)
AST: 26 U/L (ref 0–37)
Albumin: 4.3 g/dL (ref 3.5–5.2)
Alkaline Phosphatase: 58 U/L (ref 39–117)
Bilirubin, Direct: 0.1 mg/dL (ref 0.0–0.3)
Total Bilirubin: 0.7 mg/dL (ref 0.2–1.2)
Total Protein: 7.5 g/dL (ref 6.0–8.3)

## 2015-05-21 LAB — TSH: TSH: 9.45 u[IU]/mL — ABNORMAL HIGH (ref 0.35–4.50)

## 2015-05-21 LAB — LDL CHOLESTEROL, DIRECT: Direct LDL: 114 mg/dL

## 2015-05-21 MED ORDER — DOXYCYCLINE HYCLATE 100 MG PO TABS: ORAL_TABLET | ORAL | Status: DC

## 2015-05-21 NOTE — Progress Notes (Signed)
Subjective:    Patient ID: Brian Rowe, male    DOB: 1976/12/01, 39 y.o.   MRN: [id number]  HPI  39 year old patient who was seen at the lab for venipuncture prior to his scheduled physical.  He gave a history of a tick bite to the right lateral thigh about 3 weeks ago and requested Lyme disease serology.  There was concern about a rash about the tick bite area.  Patient seen as a work in No fever or other systemic complaints.  The patient is asymptomatic  Past Medical History  Diagnosis Date  . HYPERLIPIDEMIA 07/19/2007  . ANXIETY 07/19/2007  . DEPRESSION 07/19/2007  . VISUAL IMPAIRMENT 10/15/2007  . HYPERTENSION 07/19/2007  . HEMORRHOIDS, INTERNAL 12/01/2008  . Sleep apnea     History   Social History  . Marital Status: Single    Spouse Name: N/A  . Number of Children: N/A  . Years of Education: N/A   Occupational History  . Not on file.   Social History Main Topics  . Smoking status: Former Smoker    Quit date: 12/06/2011  . Smokeless tobacco: Not on file  . Alcohol Use: 0.6 oz/week    1 Cans of beer per week  . Drug Use: No  . Sexual Activity: Yes   Other Topics Concern  . Not on file   Social History Narrative    Past Surgical History  Procedure Laterality Date  . Left ankle Left   . Throat surgery    . Nasal septum surgery      Family History  Problem Relation Age of Onset  . Hypertension Father     Allergies  Allergen Reactions  . Tramadol     ?serotonin syndrome    Current Outpatient Prescriptions on File Prior to Visit  Medication Sig Dispense Refill  . alprazolam (XANAX) 2 MG tablet Take 2 mg by mouth 3 (three) times daily as needed for sleep.    Marland Kitchen amphetamine-dextroamphetamine (ADDERALL XR) 30 MG 24 hr capsule Take 30 mg by mouth as needed.    Marland Kitchen amphetamine-dextroamphetamine (ADDERALL) 30 MG tablet Take 30 mg by mouth daily.    . cyclobenzaprine (FLEXERIL) 10 MG tablet 1/2 - 1 by mouth qhs as needed     . DULoxetine (CYMBALTA) 60 MG  capsule Take 60 mg by mouth daily.     Marland Kitchen HYDROcodone-acetaminophen (NORCO) 7.5-325 MG per tablet Take 1 tablet by mouth every 8 (eight) hours as needed for moderate pain. 80 tablet 0  . meloxicam (MOBIC) 15 MG tablet Take 1 tablet (15 mg total) by mouth daily. 30 tablet 1  . valACYclovir (VALTREX) 500 MG tablet Take 1 tablet (500 mg total) by mouth 2 (two) times daily. (Patient taking differently: Take 500 mg by mouth 2 (two) times daily as needed. ) 20 tablet 1   No current facility-administered medications on file prior to visit.    BP 110/76 mmHg  Pulse 98  Temp(Src) 98.5 F (36.9 C) (Oral)  Resp 20  Ht  (1.905 m)  Wt 318 lb (144.244 kg)  BMI 39.75 kg/m2  SpO2 97%     Review of Systems  Constitutional: Negative for fever, chills, appetite change and fatigue.  HENT: Negative for congestion, dental problem, ear pain, hearing loss, sore throat, tinnitus, trouble swallowing and voice change.   Eyes: Negative for pain, discharge and visual disturbance.  Respiratory: Negative for cough, chest tightness, wheezing and stridor.   Cardiovascular: Negative for chest pain, palpitations and leg  swelling.  Gastrointestinal: Negative for nausea, vomiting, abdominal pain, diarrhea, constipation, blood in stool and abdominal distention.  Genitourinary: Negative for urgency, hematuria, flank pain, discharge, difficulty urinating and genital sores.  Musculoskeletal: Negative for myalgias, back pain, joint swelling, arthralgias, gait problem and neck stiffness.  Skin: Positive for wound. Negative for rash.  Neurological: Negative for dizziness, syncope, speech difficulty, weakness, numbness and headaches.  Hematological: Negative for adenopathy. Does not bruise/bleed easily.  Psychiatric/Behavioral: Negative for behavioral problems and dysphoric mood. The patient is not nervous/anxious.        Objective:   Physical Exam  Constitutional: He appears well-developed and well-nourished. No  distress.  Skin:  A 5 mm, slightly irritated papular lesion noted involving the right lateral thigh area.  There is no surrounding erythema; Magnified view of the lesion revealed no definite evidence of foreign body          Assessment & Plan:   Tick exposure.  Will treat with a single dose of doxycycline 100 mg.  The patient will report any fever or other systemic complaints.  Return for his annual exam as scheduled

## 2015-05-21 NOTE — Addendum Note (Signed)
Addended by: Bonnye Fava on: 05/21/2015 08:43 AM   Modules accepted: Orders

## 2015-05-21 NOTE — Patient Instructions (Signed)
Doxycycline 1 tablet single dose Report any fever, headache or acute illness  Annual exam as scheduled

## 2015-05-21 NOTE — Progress Notes (Signed)
Pre visit review using our clinic review tool, if applicable. No additional management support is needed unless otherwise documented below in the visit note. 

## 2015-05-22 LAB — HIV ANTIBODY (ROUTINE TESTING W REFLEX): HIV 1&2 Ab, 4th Generation: NONREACTIVE

## 2015-05-22 LAB — RPR

## 2015-05-22 LAB — HSV(HERPES SIMPLEX VRS) I + II AB-IGG
HSV 1 Glycoprotein G Ab, IgG: <0.1 IV
HSV 2 Glycoprotein G Ab, IgG: 12.73 IV — ABNORMAL HIGH

## 2015-05-26 ENCOUNTER — Telehealth: Payer: Self-pay | Admitting: Family Medicine

## 2015-05-26 NOTE — Telephone Encounter (Signed)
Requesting a refill on Mobic to be sent to [facility].

## 2015-05-27 NOTE — Telephone Encounter (Signed)
Patient was verbally informed that we are not able to refill his mobic.

## 2015-05-27 NOTE — Telephone Encounter (Signed)
Patient is seeing another primary doctor in [place]. Cannot have but one PCP. Patient must get that doctor to refill medication. Has not been seen her in over a year.

## 2015-05-28 ENCOUNTER — Encounter: Payer: BLUE CROSS/BLUE SHIELD | Admitting: Internal Medicine

## 2015-05-29 ENCOUNTER — Telehealth: Payer: Self-pay | Admitting: Internal Medicine

## 2015-05-29 ENCOUNTER — Ambulatory Visit (INDEPENDENT_AMBULATORY_CARE_PROVIDER_SITE_OTHER): Payer: BLUE CROSS/BLUE SHIELD | Admitting: Internal Medicine

## 2015-05-29 ENCOUNTER — Encounter: Payer: Self-pay | Admitting: Internal Medicine

## 2015-05-29 VITALS — BP 130/80 | HR 85 | Temp 98.6°F | Resp 20 | Ht 74.75 in | Wt 324.0 lb

## 2015-05-29 DIAGNOSIS — Z0000 Encounter for general adult medical examination without abnormal findings: Secondary | ICD-10-CM | POA: Diagnosis not present

## 2015-05-29 DIAGNOSIS — Z9989 Dependence on other enabling machines and devices: Principal | ICD-10-CM

## 2015-05-29 DIAGNOSIS — E785 Hyperlipidemia, unspecified: Secondary | ICD-10-CM

## 2015-05-29 DIAGNOSIS — I10 Essential (primary) hypertension: Secondary | ICD-10-CM

## 2015-05-29 DIAGNOSIS — E039 Hypothyroidism, unspecified: Secondary | ICD-10-CM | POA: Insufficient documentation

## 2015-05-29 DIAGNOSIS — M545 Low back pain, unspecified: Secondary | ICD-10-CM

## 2015-05-29 DIAGNOSIS — G4733 Obstructive sleep apnea (adult) (pediatric): Secondary | ICD-10-CM

## 2015-05-29 MED ORDER — MELOXICAM 15 MG PO TABS: 15.0000 mg | ORAL_TABLET | Freq: Every day | ORAL | Status: DC

## 2015-05-29 MED ORDER — LEVOTHYROXINE SODIUM 50 MCG PO TABS: 50.0000 ug | ORAL_TABLET | Freq: Every day | ORAL | Status: DC

## 2015-05-29 NOTE — Telephone Encounter (Signed)
Rx sent to pharmacy   

## 2015-05-29 NOTE — Telephone Encounter (Signed)
Pt needs new rx mobic sent to [facility]

## 2015-05-29 NOTE — Progress Notes (Signed)
Subjective:    Patient ID: Brian Rowe, male    DOB: [birth year], 39 y.o.   MRN: [id number]  HPI 39 year old patient who is seen today for a preventive health examination.  He has been seen by urology recently for chronic prostatitis and is scheduled for follow-up and a prostate ultrasound soon.  He has morbid obesity, essential hypertension.  He has OSA and remains on CPAP.  He has had a UPPP. Doing well today  Family history of father age 57 history of hypertension and recurrent DVTs Mother age 17.  History of obesity, OSA One brother in good health, a 18 One sister, age 76 with RA  Past Medical History  Diagnosis Date  . HYPERLIPIDEMIA 07/19/2007  . ANXIETY 07/19/2007  . DEPRESSION 07/19/2007  . VISUAL IMPAIRMENT 10/15/2007  . HYPERTENSION 07/19/2007  . HEMORRHOIDS, INTERNAL 12/01/2008  . Sleep apnea     History   Social History  . Marital Status: Single    Spouse Name: N/A  . Number of Children: N/A  . Years of Education: N/A   Occupational History  . Not on file.   Social History Main Topics  . Smoking status: Former Smoker    Quit date: 12/06/2011  . Smokeless tobacco: Not on file  . Alcohol Use: 0.6 oz/week    1 Cans of beer per week  . Drug Use: No  . Sexual Activity: Yes   Other Topics Concern  . Not on file   Social History Narrative    Past Surgical History  Procedure Laterality Date  . Left ankle Left   . Throat surgery    . Nasal septum surgery      Family History  Problem Relation Age of Onset  . Hypertension Father     Allergies  Allergen Reactions  . Tramadol     ?serotonin syndrome    Current Outpatient Prescriptions on File Prior to Visit  Medication Sig Dispense Refill  . alprazolam (XANAX) 2 MG tablet Take 2 mg by mouth 3 (three) times daily as needed for sleep.    Marland Kitchen amphetamine-dextroamphetamine (ADDERALL XR) 30 MG 24 hr capsule Take 30 mg by mouth as needed.    Marland Kitchen amphetamine-dextroamphetamine (ADDERALL) 30 MG tablet Take  30 mg by mouth daily.    . cyclobenzaprine (FLEXERIL) 10 MG tablet 1/2 - 1 by mouth qhs as needed     . DULoxetine (CYMBALTA) 60 MG capsule Take 60 mg by mouth daily.     Marland Kitchen HYDROcodone-acetaminophen (NORCO) 7.5-325 MG per tablet Take 1 tablet by mouth every 8 (eight) hours as needed for moderate pain. 80 tablet 0  . meloxicam (MOBIC) 15 MG tablet Take 1 tablet (15 mg total) by mouth daily. 30 tablet 1  . sulfamethoxazole-trimethoprim (BACTRIM DS,SEPTRA DS) 800-160 MG per tablet TK 1 T PO BID  0  . valACYclovir (VALTREX) 500 MG tablet Take 1 tablet (500 mg total) by mouth 2 (two) times daily. (Patient taking differently: Take 500 mg by mouth 2 (two) times daily as needed. ) 20 tablet 1   No current facility-administered medications on file prior to visit.    BP 130/80 mmHg  Pulse 85  Temp(Src) 98.6 F (37 C) (Oral)  Resp 20  Ht 6' 2.75" (1.899 m)  Wt 324 lb (146.965 kg)  BMI 40.75 kg/m2  SpO2 97%      Review of Systems  Constitutional: Negative for fever, chills, activity change, appetite change and fatigue.  HENT: Negative for congestion, dental problem, ear  pain, hearing loss, mouth sores, rhinorrhea, sinus pressure, sneezing, tinnitus, trouble swallowing and voice change.   Eyes: Negative for photophobia, pain, redness and visual disturbance.  Respiratory: Negative for apnea, cough, choking, chest tightness, shortness of breath and wheezing.   Cardiovascular: Negative for chest pain, palpitations and leg swelling.  Gastrointestinal: Negative for nausea, vomiting, abdominal pain, diarrhea, constipation, blood in stool, abdominal distention, anal bleeding and rectal pain.  Genitourinary: Negative for dysuria, urgency, frequency, hematuria, flank pain, decreased urine volume, discharge, penile swelling, scrotal swelling, difficulty urinating, genital sores and testicular pain.  Musculoskeletal: Positive for back pain. Negative for myalgias, joint swelling, arthralgias, gait problem,  neck pain and neck stiffness.  Skin: Negative for color change, rash and wound.  Neurological: Negative for dizziness, tremors, seizures, syncope, facial asymmetry, speech difficulty, weakness, light-headedness, numbness and headaches.  Hematological: Negative for adenopathy. Does not bruise/bleed easily.  Psychiatric/Behavioral: Negative for suicidal ideas, hallucinations, behavioral problems, confusion, sleep disturbance, self-injury, dysphoric mood, decreased concentration and agitation. The patient is not nervous/anxious.        Objective:   Physical Exam  Constitutional: He appears well-developed and well-nourished.  Weight 324  HENT:  Head: Normocephalic and atraumatic.  Right Ear: External ear normal.  Left Ear: External ear normal.  Nose: Nose normal.  Mouth/Throat: Oropharynx is clear and moist.  Eyes: Conjunctivae and EOM are normal. Pupils are equal, round, and reactive to light. No scleral icterus.  Neck: Normal range of motion. Neck supple. No JVD present. No thyromegaly present.  Cardiovascular: Regular rhythm, normal heart sounds and intact distal pulses.  Exam reveals no gallop and no friction rub.   No murmur heard. Pulmonary/Chest: Effort normal and breath sounds normal. He exhibits no tenderness.  Abdominal: Soft. Bowel sounds are normal. He exhibits no distension and no mass. There is no tenderness.  Genitourinary: Penis normal.  Musculoskeletal: Normal range of motion. He exhibits no edema or tenderness.  Lymphadenopathy:    He has no cervical adenopathy.  Neurological: He is alert. He has normal reflexes. No cranial nerve deficit. Coordination normal.  Skin: Skin is warm and dry. No rash noted.  Psychiatric: He has a normal mood and affect. His behavior is normal.          Assessment & Plan:   Preventive health examination Morbid obesity Hypothyroidism.  We'll start levothyroxin 0.05 milligrams daily.  Recheck a TSH in 6 weeks Hypertension, stable.   ADHD

## 2015-05-29 NOTE — Progress Notes (Deleted)
Subjective:    Patient ID: Brian Rowe, male    DOB: June 22, 1976, 39 y.o.   MRN: [id number]  HPI   39 year old patient who was seen at the lab for venipuncture prior to his scheduled physical.  He gave a history of a tick bite to the right lateral thigh about 3 weeks ago and requested Lyme disease serology.  There was concern about a rash about the tick bite area.  Patient seen as a work in No fever or other systemic complaints.  The patient is asymptomatic  Past Medical History  Diagnosis Date  . HYPERLIPIDEMIA 07/19/2007  . ANXIETY 07/19/2007  . DEPRESSION 07/19/2007  . VISUAL IMPAIRMENT 10/15/2007  . HYPERTENSION 07/19/2007  . HEMORRHOIDS, INTERNAL 12/01/2008  . Sleep apnea     History   Social History  . Marital Status: Single    Spouse Name: N/A  . Number of Children: N/A  . Years of Education: N/A   Occupational History  . Not on file.   Social History Main Topics  . Smoking status: Former Smoker    Quit date: 12/06/2011  . Smokeless tobacco: Not on file  . Alcohol Use: 0.6 oz/week    1 Cans of beer per week  . Drug Use: No  . Sexual Activity: Yes   Other Topics Concern  . Not on file   Social History Narrative    Past Surgical History  Procedure Laterality Date  . Left ankle Left   . Throat surgery    . Nasal septum surgery      Family History  Problem Relation Age of Onset  . Hypertension Father     Allergies  Allergen Reactions  . Tramadol     ?serotonin syndrome    Current Outpatient Prescriptions on File Prior to Visit  Medication Sig Dispense Refill  . alprazolam (XANAX) 2 MG tablet Take 2 mg by mouth 3 (three) times daily as needed for sleep.    Marland Kitchen amphetamine-dextroamphetamine (ADDERALL XR) 30 MG 24 hr capsule Take 30 mg by mouth as needed.    Marland Kitchen amphetamine-dextroamphetamine (ADDERALL) 30 MG tablet Take 30 mg by mouth daily.    . cyclobenzaprine (FLEXERIL) 10 MG tablet 1/2 - 1 by mouth qhs as needed     . DULoxetine (CYMBALTA) 60  MG capsule Take 60 mg by mouth daily.     Marland Kitchen HYDROcodone-acetaminophen (NORCO) 7.5-325 MG per tablet Take 1 tablet by mouth every 8 (eight) hours as needed for moderate pain. 80 tablet 0  . meloxicam (MOBIC) 15 MG tablet Take 1 tablet (15 mg total) by mouth daily. 30 tablet 1  . sulfamethoxazole-trimethoprim (BACTRIM DS,SEPTRA DS) 800-160 MG per tablet TK 1 T PO BID  0  . valACYclovir (VALTREX) 500 MG tablet Take 1 tablet (500 mg total) by mouth 2 (two) times daily. (Patient taking differently: Take 500 mg by mouth 2 (two) times daily as needed. ) 20 tablet 1   No current facility-administered medications on file prior to visit.    BP 130/80 mmHg  Pulse 85  Temp(Src) 98.6 F (37 C) (Oral)  Resp 20  Ht 6' 2.75" (1.899 m)  Wt 324 lb (146.965 kg)  BMI 40.75 kg/m2  SpO2 97%     Review of Systems  Constitutional: Negative for fever, chills, appetite change and fatigue.  HENT: Negative for congestion, dental problem, ear pain, hearing loss, sore throat, tinnitus, trouble swallowing and voice change.   Eyes: Negative for pain, discharge and visual disturbance.  Respiratory:  Negative for cough, chest tightness, wheezing and stridor.   Cardiovascular: Negative for chest pain, palpitations and leg swelling.  Gastrointestinal: Negative for nausea, vomiting, abdominal pain, diarrhea, constipation, blood in stool and abdominal distention.  Genitourinary: Negative for urgency, hematuria, flank pain, discharge, difficulty urinating and genital sores.  Musculoskeletal: Negative for myalgias, back pain, joint swelling, arthralgias, gait problem and neck stiffness.  Skin: Positive for wound. Negative for rash.  Neurological: Negative for dizziness, syncope, speech difficulty, weakness, numbness and headaches.  Hematological: Negative for adenopathy. Does not bruise/bleed easily.  Psychiatric/Behavioral: Negative for behavioral problems and dysphoric mood. The patient is not nervous/anxious.          Objective:   Physical Exam  Constitutional: He appears well-developed and well-nourished. No distress.  Skin:  A 5 mm, slightly irritated papular lesion noted involving the right lateral thigh area.  There is no surrounding erythema; Magnified view of the lesion revealed no definite evidence of foreign body          Assessment & Plan:   Tick exposure.  Will treat with a single dose of doxycycline 100 mg.  The patient will report any fever or other systemic complaints.  Return for his annual exam as scheduled

## 2015-05-29 NOTE — Progress Notes (Signed)
Pre visit review using our clinic review tool, if applicable. No additional management support is needed unless otherwise documented below in the visit note. 

## 2015-05-29 NOTE — Patient Instructions (Addendum)
Limit your sodium (Salt) intake    It is important that you exercise regularly, at least 20 minutes 3 to 4 times per week.  If you develop chest pain or shortness of breath seek  medical attention.  You need to lose weight.  Consider a lower calorie diet and regular exercise.  Return in 6 months for follow-up   Repeat TSH in 6-8 weeks  Health Maintenance A healthy lifestyle and preventative care can promote health and wellness.  Maintain regular health, dental, and eye exams.  Eat a healthy diet. Foods like vegetables, fruits, whole grains, low-fat dairy products, and lean protein foods contain the nutrients you need and are low in calories. Decrease your intake of foods high in solid fats, added sugars, and salt. Get information about a proper diet from your health care provider, if necessary.  Regular physical exercise is one of the most important things you can do for your health. Most adults should get at least 150 minutes of moderate-intensity exercise (any activity that increases your heart rate and causes you to sweat) each week. In addition, most adults need muscle-strengthening exercises on 2 or more days a week.   Maintain a healthy weight. The body mass index (BMI) is a screening tool to identify possible weight problems. It provides an estimate of body fat based on height and weight. Your health care provider can find your BMI and can help you achieve or maintain a healthy weight. For males 20 years and older:  A BMI below 18.5 is considered underweight.  A BMI of 18.5 to 24.9 is normal.  A BMI of 25 to 29.9 is considered overweight.  A BMI of 30 and above is considered obese.  Maintain normal blood lipids and cholesterol by exercising and minimizing your intake of saturated fat. Eat a balanced diet with plenty of fruits and vegetables. Blood tests for lipids and cholesterol should begin at age 43 and be repeated every 5 years. If your lipid or cholesterol levels are high,  you are over age 83, or you are at high risk for heart disease, you may need your cholesterol levels checked more frequently.Ongoing high lipid and cholesterol levels should be treated with medicines if diet and exercise are not working.  If you smoke, find out from your health care provider how to quit. If you do not use tobacco, do not start.  Lung cancer screening is recommended for adults aged 55-80 years who are at high risk for developing lung cancer because of a history of smoking. A yearly low-dose CT scan of the lungs is recommended for people who have at least a 30-pack-year history of smoking and are current smokers or have quit within the past 15 years. A pack year of smoking is smoking an average of 1 pack of cigarettes a day for 1 year (for example, a 30-pack-year history of smoking could mean smoking 1 pack a day for 30 years or 2 packs a day for 15 years). Yearly screening should continue until the smoker has stopped smoking for at least 15 years. Yearly screening should be stopped for people who develop a health problem that would prevent them from having lung cancer treatment.  If you choose to drink alcohol, do not have more than 2 drinks per day. One drink is considered to be 12 oz (360 mL) of beer, 5 oz (150 mL) of wine, or 1.5 oz (45 mL) of liquor.  Avoid the use of street drugs. Do not share needles with  anyone. Ask for help if you need support or instructions about stopping the use of drugs.  High blood pressure causes heart disease and increases the risk of stroke. Blood pressure should be checked at least every 1-2 years. Ongoing high blood pressure should be treated with medicines if weight loss and exercise are not effective.  If you are 87-25 years old, ask your health care provider if you should take aspirin to prevent heart disease.  Diabetes screening involves taking a blood sample to check your fasting blood sugar level. This should be done once every 3 years after age  91 if you are at a normal weight and without risk factors for diabetes. Testing should be considered at a younger age or be carried out more frequently if you are overweight and have at least 1 risk factor for diabetes.  Colorectal cancer can be detected and often prevented. Most routine colorectal cancer screening begins at the age of 110 and continues through age 16. However, your health care provider may recommend screening at an earlier age if you have risk factors for colon cancer. On a yearly basis, your health care provider may provide home test kits to check for hidden blood in the stool. A small camera at the end of a tube may be used to directly examine the colon (sigmoidoscopy or colonoscopy) to detect the earliest forms of colorectal cancer. Talk to your health care provider about this at age 72 when routine screening begins. A direct exam of the colon should be repeated every 5-10 years through age 67, unless early forms of precancerous polyps or small growths are found.  People who are at an increased risk for hepatitis B should be screened for this virus. You are considered at high risk for hepatitis B if:  You were born in a country where hepatitis B occurs often. Talk with your health care provider about which countries are considered high risk.  Your parents were born in a high-risk country and you have not received a shot to protect against hepatitis B (hepatitis B vaccine).  You have HIV or AIDS.  You use needles to inject street drugs.  You live with, or have sex with, someone who has hepatitis B.  You are a man who has sex with other men (MSM).  You get hemodialysis treatment.  You take certain medicines for conditions like cancer, organ transplantation, and autoimmune conditions.  Hepatitis C blood testing is recommended for all people born from [birth year] through 1964 and any individual with known risk factors for hepatitis C.  Healthy men should no longer receive  prostate-specific antigen (PSA) blood tests as part of routine cancer screening. Talk to your health care provider about prostate cancer screening.  Testicular cancer screening is not recommended for adolescents or adult males who have no symptoms. Screening includes self-exam, a health care provider exam, and other screening tests. Consult with your health care provider about any symptoms you have or any concerns you have about testicular cancer.  Practice safe sex. Use condoms and avoid high-risk sexual practices to reduce the spread of sexually transmitted infections (STIs).  You should be screened for STIs, including gonorrhea and chlamydia if:  You are sexually active and are younger than 24 years.  You are older than 24 years, and your health care provider tells you that you are at risk for this type of infection.  Your sexual activity has changed since you were last screened, and you are at an increased risk for  chlamydia or gonorrhea. Ask your health care provider if you are at risk.  If you are at risk of being infected with HIV, it is recommended that you take a prescription medicine daily to prevent HIV infection. This is called pre-exposure prophylaxis (PrEP). You are considered at risk if:  You are a man who has sex with other men (MSM).  You are a heterosexual man who is sexually active with multiple partners.  You take drugs by injection.  You are sexually active with a partner who has HIV.  Talk with your health care provider about whether you are at high risk of being infected with HIV. If you choose to begin PrEP, you should first be tested for HIV. You should then be tested every 3 months for as long as you are taking PrEP.  Use sunscreen. Apply sunscreen liberally and repeatedly throughout the day. You should seek shade when your shadow is shorter than you. Protect yourself by wearing long sleeves, pants, a wide-brimmed hat, and sunglasses year round whenever you are  outdoors.  Tell your health care provider of new moles or changes in moles, especially if there is a change in shape or color. Also, tell your health care provider if a mole is larger than the size of a pencil eraser.  A one-time screening for abdominal aortic aneurysm (AAA) and surgical repair of large AAAs by ultrasound is recommended for men aged 42-75 years who are current or former smokers.  Stay current with your vaccines (immunizations). Document Released: 05/19/2008 Document Revised: 11/26/2013 Document Reviewed: 04/18/2011 [facility] Patient Information 2015 [place], [place]. This information is not intended to replace advice given to you by your health care provider. Make sure you discuss any questions you have with your health care provider.

## 2015-07-06 ENCOUNTER — Encounter: Payer: Self-pay | Admitting: Registered Nurse

## 2015-07-06 ENCOUNTER — Encounter: Payer: BLUE CROSS/BLUE SHIELD | Attending: Physical Medicine & Rehabilitation | Admitting: Registered Nurse

## 2015-07-06 ENCOUNTER — Ambulatory Visit: Payer: BLUE CROSS/BLUE SHIELD | Admitting: Registered Nurse

## 2015-07-06 VITALS — BP 136/92 | HR 71 | Resp 14

## 2015-07-06 DIAGNOSIS — F419 Anxiety disorder, unspecified: Secondary | ICD-10-CM | POA: Insufficient documentation

## 2015-07-06 DIAGNOSIS — M25571 Pain in right ankle and joints of right foot: Secondary | ICD-10-CM | POA: Insufficient documentation

## 2015-07-06 DIAGNOSIS — G8929 Other chronic pain: Secondary | ICD-10-CM | POA: Diagnosis not present

## 2015-07-06 DIAGNOSIS — M79672 Pain in left foot: Secondary | ICD-10-CM | POA: Insufficient documentation

## 2015-07-06 DIAGNOSIS — M19272 Secondary osteoarthritis, left ankle and foot: Secondary | ICD-10-CM | POA: Diagnosis not present

## 2015-07-06 DIAGNOSIS — M79671 Pain in right foot: Secondary | ICD-10-CM | POA: Insufficient documentation

## 2015-07-06 DIAGNOSIS — M1990 Unspecified osteoarthritis, unspecified site: Secondary | ICD-10-CM | POA: Insufficient documentation

## 2015-07-06 DIAGNOSIS — Z5181 Encounter for therapeutic drug level monitoring: Secondary | ICD-10-CM

## 2015-07-06 DIAGNOSIS — Z79899 Other long term (current) drug therapy: Secondary | ICD-10-CM

## 2015-07-06 DIAGNOSIS — G894 Chronic pain syndrome: Secondary | ICD-10-CM | POA: Diagnosis not present

## 2015-07-06 DIAGNOSIS — M25572 Pain in left ankle and joints of left foot: Secondary | ICD-10-CM | POA: Diagnosis not present

## 2015-07-06 DIAGNOSIS — M7660 Achilles tendinitis, unspecified leg: Secondary | ICD-10-CM | POA: Diagnosis not present

## 2015-07-06 MED ORDER — HYDROCODONE-ACETAMINOPHEN 7.5-325 MG PO TABS: 1.0000 | ORAL_TABLET | Freq: Three times a day (TID) | ORAL | Status: DC | PRN

## 2015-07-06 NOTE — Progress Notes (Signed)
Subjective:    Patient ID: Brian Rowe, male    DOB: 1975-12-24, 39 y.o.   MRN: [id number]  HPI: Brian Rowe is a 39 year old male who returns for follow up appointment and medication refill. He says his pain is located in his right arm and  left ankle. He rates his pain 6. His current exercise regime is performing stretching exercises daily and walking short distances.  Also states he saw Dr. Ethelene Hal at [facility] this morning had EMG performed. He states he was diagnosed with moderate to severe carpal tunnel on the right and moderate on thje left. He has a follow up appointment with Dr. Darrelyn Hillock . Brian Rowe is having financial hardship, he hasn't heard a response from disability hearing. We will allow him to follow up in 2 month's , he verbalizes understanding. Second script given and post dated.    Pain Inventory Average Pain 8 Pain Right Now 6 My pain is sharp, stabbing and aching  In the last 24 hours, has pain interfered with the following? General activity 1 Relation with others 2 Enjoyment of life 3 What TIME of day is your pain at its worst? evening Sleep (in general) Poor  Pain is worse with: walking, inactivity, standing and some activites Pain improves with: rest, medication and injections Relief from Meds: 3  Mobility walk without assistance how many minutes can you walk? 15 ability to climb steps?  no do you drive?  yes  Function disabled: date disabled . I need assistance with the following:  household duties  Neuro/Psych No problems in this area  Prior Studies Any changes since last visit?  no  Physicians involved in your care Any changes since last visit?  no   Family History  Problem Relation Age of Onset  . Hypertension Father    History   Social History  . Marital Status: Single    Spouse Name: N/A  . Number of Children: N/A  . Years of Education: N/A   Social History Main Topics  . Smoking status: Former Smoker     Quit date: 12/06/2011  . Smokeless tobacco: Not on file  . Alcohol Use: 0.6 oz/week    1 Cans of beer per week  . Drug Use: No  . Sexual Activity: Yes   Other Topics Concern  . None   Social History Narrative   Past Surgical History  Procedure Laterality Date  . Left ankle Left   . Throat surgery    . Nasal septum surgery     Past Medical History  Diagnosis Date  . HYPERLIPIDEMIA 07/19/2007  . ANXIETY 07/19/2007  . DEPRESSION 07/19/2007  . VISUAL IMPAIRMENT 10/15/2007  . HYPERTENSION 07/19/2007  . HEMORRHOIDS, INTERNAL 12/01/2008  . Sleep apnea    BP 136/92 mmHg  Pulse 71  Resp 14  SpO2 96%  Opioid Risk Score:   Fall Risk Score:  `1  Depression screen PHQ 2/9  Depression screen PHQ 2/9 03/03/2015  Decreased Interest 1  Down, Depressed, Hopeless 1  PHQ - 2 Score 2  Altered sleeping 2  Tired, decreased energy 2  Change in appetite 1  Feeling bad or failure about yourself  1  Trouble concentrating 3  Moving slowly or fidgety/restless 0  Suicidal thoughts 0  PHQ-9 Score 11     Review of Systems  Respiratory: Positive for apnea.   All other systems reviewed and are negative.      Objective:   Physical Exam  Constitutional: He is oriented to person, place, and time. He appears well-developed and well-nourished.  HENT:  Head: Normocephalic and atraumatic.  Neck: Normal range of motion. Neck supple.  Cardiovascular: Normal rate and regular rhythm.   Pulmonary/Chest: Effort normal and breath sounds normal.  Musculoskeletal:  Normal Muscle Bulk and Muscle Testing Reveals: Upper Extremities: Full ROM and Muscle Strength 5/5 Lower Extremities: Full ROM and Muscle Strength 5/5 Arises from chair with ease Narrow Based Gait  Neurological: He is alert and oriented to person, place, and time.  Skin: Skin is warm and dry.  Psychiatric: He has a normal mood and affect.  Nursing note and vitals reviewed.         Assessment & Plan:  1. Chronic foot and  ankle pain, left more than right: hx of multiple foot surgeries as a child due to ?devlopmental disorder. Continue with Daily stretches Refilled: Hydrocodone 7.5/325 mg one tablet every 8 hours as needed. #80. Second script given for the following month. 2. Mild achilles tendonitis/right calcaneal spur: Follow up with Dr. Victorino Dike For left ankle/foot fusion. On hold due to financial hardship. 3. Morbid Obesity : Continue to Live Healthy Diet and Exercise regime 4. Anxiety disorder: Continue Xanax  of face to face patient care time was spent during this visit. All questions were encouraged and answered . F/U in 2 month

## 2015-07-16 ENCOUNTER — Other Ambulatory Visit: Payer: Self-pay | Admitting: Surgical

## 2015-07-22 NOTE — H&P (Signed)
Brian Rowe is an 39 y.o. male.   Chief Complaint: right wrist pain HPI: Aeneas presents today with a chief complaint of bilateral hand pain, numbness, and tingling. The right hand is more symptomatic than left. He says that this has been a progressive thing that he has been dealing with for 10 years. He says that in the past, he has just issues with numbness at night. In the past few months, it is progressed to the point where he is having some burning discomfort in his hands right more than left and he has even noticed weakness in the right hand. He says he cannot open jars with his right hand. He is having to use his left hand for that. The left hand he is mostly just having the numbness at night. He is not really having much discomfort during the day. He has not noticed any weakness in the left wrist or hand. He says that the numbness, tingling, and discomfort is worse in the first three digits. He does not have any pain that radiates down from the neck. 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 He has taken some anti-inflammatories. He is currently on meloxicam. These have not helped. Nerve conduction studies show bilateral CTS, right greater than left.  Past Medical History  Diagnosis Date  . HYPERLIPIDEMIA 07/19/2007  . ANXIETY 07/19/2007  . DEPRESSION 07/19/2007  . VISUAL IMPAIRMENT 10/15/2007  . HYPERTENSION 07/19/2007  . HEMORRHOIDS, INTERNAL 12/01/2008  . Sleep apnea     Past Surgical History  Procedure Laterality Date  . Left ankle Left   . Throat surgery    . Nasal septum surgery      Family History  Problem Relation Age of Onset  . Hypertension Father    Social History:  reports that he quit smoking about 3 years ago. He does not have any smokeless tobacco history on file. He reports that he drinks about 0.6 oz of alcohol per week. He reports that he does not use illicit drugs.  Allergies:  Allergies   Allergen Reactions  . Tramadol     ?serotonin syndrome      Current outpatient prescriptions:  .  alprazolam (XANAX) 2 MG tablet, Take 2 mg by mouth 3 (three) times daily as needed for sleep., Disp: , Rfl:  .  amphetamine-dextroamphetamine (ADDERALL XR) 30 MG 24 hr capsule, Take 30 mg by mouth daily. , Disp: , Rfl:  .  amphetamine-dextroamphetamine (ADDERALL) 30 MG tablet, Take 30 mg by mouth daily., Disp: , Rfl:  .  ciprofloxacin (CIPRO) 500 MG tablet, Take 1 tablet by mouth 2 (two) times daily. For 28 days, Disp: , Rfl: 0 .  cyclobenzaprine (FLEXERIL) 10 MG tablet, Take 5-10 mg by mouth 3 (three) times daily as needed for muscle spasms. , Disp: , Rfl:  .  DULoxetine (CYMBALTA) 60 MG capsule, Take 60 mg by mouth daily. , Disp: , Rfl:  .  HYDROcodone-acetaminophen (NORCO) 7.5-325 MG per tablet, Take 1 tablet by mouth every 8 (eight) hours as needed for moderate pain., Disp: 80 tablet, Rfl: 0 .  ibuprofen (ADVIL,MOTRIN) 800 MG tablet, Take 1 tablet by mouth 3 (three) times daily as needed for mild pain. , Disp: , Rfl: 0 .  levothyroxine (SYNTHROID, LEVOTHROID) 50 MCG tablet, Take 1 tablet (50 mcg total) by mouth daily before breakfast., Disp: 90 tablet, Rfl: 4 .  meloxicam (MOBIC) 15 MG tablet, Take 1 tablet (15  mg total) by mouth daily., Disp: 30 tablet, Rfl: 5 .  valACYclovir (VALTREX) 500 MG tablet, Take 1 tablet (500 mg total) by mouth 2 (two) times daily. (Patient taking differently: Take 500 mg by mouth 2 (two) times daily as needed (Herpes). ), Disp: 20 tablet, Rfl: 1   Review of Systems  Constitutional: Positive for malaise/fatigue. Negative for fever, chills, weight loss and diaphoresis.  HENT: Negative for congestion, ear discharge, ear pain, hearing loss, nosebleeds, sore throat and tinnitus.   Eyes: Negative.   Respiratory: Positive for shortness of breath. Negative for cough, hemoptysis, sputum production, wheezing and stridor.   Cardiovascular: Positive for palpitations.  Negative for chest pain, orthopnea, claudication, leg swelling and PND.  Gastrointestinal: Negative.   Genitourinary: Positive for frequency. Negative for dysuria, urgency, hematuria and flank pain.  Musculoskeletal: Positive for myalgias, back pain and joint pain. Negative for falls and neck pain.  Skin: Negative.   Neurological: Positive for dizziness, tingling and sensory change. Negative for tremors, speech change, focal weakness, seizures, loss of consciousness, weakness and headaches.  Endo/Heme/Allergies: Negative.   Psychiatric/Behavioral: Positive for memory loss. Negative for depression, suicidal ideas, hallucinations and substance abuse. The patient is nervous/anxious. The patient does not have insomnia.    Vitals  Weight: 320 lb Height: 75in Body Surface Area: 2.68 m Body Mass Index: 40 kg/m  BP:136/90 HR:88 bpm  Physical Exam  Constitutional: He is oriented to person, place, and time. He appears well-developed. No distress.  Morbidly obese  HENT:  Head: Normocephalic and atraumatic.  Right Ear: External ear normal.  Left Ear: External ear normal.  Nose: Nose normal.  Mouth/Throat: Oropharynx is clear and moist.  Eyes: Conjunctivae and EOM are normal.  Neck: Normal range of motion. Neck supple.  Cardiovascular: Normal rate, regular rhythm, normal heart sounds and intact distal pulses.   Respiratory: Effort normal and breath sounds normal. No respiratory distress. He has no wheezes.  GI: Soft. Bowel sounds are normal. He exhibits no distension. There is no tenderness.  Musculoskeletal:  Range of motion of the cervical spine does not elicit radicular pain down the arms. He is intact in regard to biceps and triceps strength. He has normal painless range of motion in the shoulders and the elbows. Radial pulse 2+ bilaterally. The left hand grip strength is 5/5, negative Phalen and Tinel sign, negative Finkelstein test. No specific tenderness to palpation over the wrist or  hand. Sensation is intact on exam. The right wrist, he does have a positive Phalen and Tinel sign. Negative Finkelstein. He does have some tenderness to palpation along the wrist over the median nerve. He does have decreased sensation along the median nerve distribution. Strength is a concern on that right hand. Grip strength is 2/5.  Neurological: He is alert and oriented to person, place, and time.  Skin: No rash noted. He is not diaphoretic. No erythema.  Psychiatric: He has a normal mood and affect. His behavior is normal.     Assessment/Plan Right wrist, carpal tunnel syndrome He needs a right wrist carpal tunnel release. Risks and benefits of the procedure were discussed with the patient by Dr. Darrelyn Hillock.    H&P performed by Dr. Ranee Gosselin  Donesha Wallander Leotis Shames 07/22/2015, 1:20 PM

## 2015-07-28 NOTE — Patient Instructions (Signed)
Brian Rowe  07/28/2015   Your procedure is scheduled on: Friday 07/31/2015  Report to [facility] Main  Entrance take [facility]  elevators to 3rd floor to  Short Stay Center at 100 PM.  Call this number if you have problems the morning of surgery [phone]   Remember: ONLY 1 PERSON MAY GO WITH YOU TO SHORT STAY TO GET  READY MORNING OF YOUR SURGERY.   Do not eat food  :After Midnight. MAY HAVE CLEAR LIQUIDS FROM MIDNIGHT UP UNTIL 0900 AM THEN NOTHING UNTIL AFTER SURGERY!     Take these medicines the morning of surgery with A SIP OF WATER: LEVOTHYROXINE,CYMBALTA                               You may not have any metal on your body including hair pins and              piercings  Do not wear jewelry, make-up, lotions, powders or perfumes, deodorant             Do not wear nail polish.  Do not shave  48 hours prior to surgery.              Men may shave face and neck.   Do not bring valuables to the hospital. [place] IS NOT             RESPONSIBLE   FOR VALUABLES.  Contacts, dentures or bridgework may not be worn into surgery.  Leave suitcase in the car. After surgery it may be brought to your room.     Patients discharged the day of surgery will not be allowed to drive home.  Name and phone number of your driver:  Special Instructions: N/A              Please read over the following fact sheets you were given: _____________________________________________________________________             [facility] - Preparing for Surgery Before surgery, you can play an important role.  Because skin is not sterile, your skin needs to be as free of germs as possible.  You can reduce the number of germs on your skin by washing with CHG (chlorahexidine gluconate) soap before surgery.  CHG is an antiseptic cleaner which kills germs and bonds with the skin to continue killing germs even after washing. Please DO NOT use if you have an allergy to CHG or antibacterial  soaps.  If your skin becomes reddened/irritated stop using the CHG and inform your nurse when you arrive at Short Stay. Do not shave (including legs and underarms) for at least 48 hours prior to the first CHG shower.  You may shave your face/neck. Please follow these instructions carefully:  1.  Shower with CHG Soap the night before surgery and the  morning of Surgery.  2.  If you choose to wash your hair, wash your hair first as usual with your  normal  shampoo.  3.  After you shampoo, rinse your hair and body thoroughly to remove the  shampoo.                           4.  Use CHG as you would any other liquid soap.  You can apply chg directly  to the skin and  wash                       Gently with a scrungie or clean washcloth.  5.  Apply the CHG Soap to your body ONLY FROM THE NECK DOWN.   Do not use on face/ open                           Wound or open sores. Avoid contact with eyes, ears mouth and genitals (private parts).                       Wash face,  Genitals (private parts) with your normal soap.             6.  Wash thoroughly, paying special attention to the area where your surgery  will be performed.  7.  Thoroughly rinse your body with warm water from the neck down.  8.  DO NOT shower/wash with your normal soap after using and rinsing off  the CHG Soap.                9.  Pat yourself dry with a clean towel.            10.  Wear clean pajamas.            11.  Place clean sheets on your bed the night of your first shower and do not  sleep with pets. Day of Surgery : Do not apply any lotions/deodorants the morning of surgery.  Please wear clean clothes to the hospital/surgery center.  FAILURE TO FOLLOW THESE INSTRUCTIONS MAY RESULT IN THE CANCELLATION OF YOUR SURGERY PATIENT SIGNATURE_________________________________  NURSE SIGNATURE__________________________________  ________________________________________________________________________   Brian Rowe  An  incentive spirometer is a tool that can help keep your lungs clear and active. This tool measures how well you are filling your lungs with each breath. Taking long deep breaths may help reverse or decrease the chance of developing breathing (pulmonary) problems (especially infection) following:  A long period of time when you are unable to move or be active. BEFORE THE PROCEDURE   If the spirometer includes an indicator to show your best effort, your nurse or respiratory therapist will set it to a desired goal.  If possible, sit up straight or lean slightly forward. Try not to slouch.  Hold the incentive spirometer in an upright position. INSTRUCTIONS FOR USE  1. Sit on the edge of your bed if possible, or sit up as far as you can in bed or on a chair. 2. Hold the incentive spirometer in an upright position. 3. Breathe out normally. 4. Place the mouthpiece in your mouth and seal your lips tightly around it. 5. Breathe in slowly and as deeply as possible, raising the piston or the ball toward the top of the column. 6. Hold your breath for 3-5 seconds or for as long as possible. Allow the piston or ball to fall to the bottom of the column. 7. Remove the mouthpiece from your mouth and breathe out normally. 8. Rest for a few seconds and repeat Steps 1 through 7 at least 10 times every 1-2 hours when you are awake. Take your time and take a few normal breaths between deep breaths. 9. The spirometer may include an indicator to show your best effort. Use the indicator as a goal to work toward during each repetition. 10. After each set of 10 deep  breaths, practice coughing to be sure your lungs are clear. If you have an incision (the cut made at the time of surgery), support your incision when coughing by placing a pillow or rolled up towels firmly against it. Once you are able to get out of bed, walk around indoors and cough well. You may stop using the incentive spirometer when instructed by your  caregiver.  RISKS AND COMPLICATIONS  Take your time so you do not get dizzy or light-headed.  If you are in pain, you may need to take or ask for pain medication before doing incentive spirometry. It is harder to take a deep breath if you are having pain. AFTER USE  Rest and breathe slowly and easily.  It can be helpful to keep track of a log of your progress. Your caregiver can provide you with a simple table to help with this. If you are using the spirometer at home, follow these instructions: SEEK MEDICAL CARE IF:   You are having difficultly using the spirometer.  You have trouble using the spirometer as often as instructed.  Your pain medication is not giving enough relief while using the spirometer.  You develop fever of 100.5 F (38.1 C) or higher. SEEK IMMEDIATE MEDICAL CARE IF:   You cough up bloody sputum that had not been present before.  You develop fever of 102 F (38.9 C) or greater.  You develop worsening pain at or near the incision site. MAKE SURE YOU:   Understand these instructions.  Will watch your condition.  Will get help right away if you are not doing well or get worse. Document Released: 04/03/2007 Document Revised: 02/13/2012 Document Reviewed: 06/04/2007 Exit[employer] Patient Information 2014 ExitCare, [place].   ________________________________________________________________________    CLEAR LIQUID DIET   Foods Allowed                                                                     Foods Excluded  Coffee and tea, regular and decaf                             liquids that you cannot  Plain Jell-O in any flavor                                             see through such as: Fruit ices (not with fruit pulp)                                     milk, soups, orange juice  Iced Popsicles                                    All solid food Carbonated beverages, regular and diet                                    Cranberry, grape and  apple  juices Sports drinks like Gatorade Lightly seasoned clear broth or consume(fat free) Sugar, honey syrup  Sample Menu Breakfast                                Lunch                                     Supper Cranberry juice                    Beef broth                            Chicken broth Jell-O                                     Grape juice                           Apple juice Coffee or tea                        Jell-O                                      Popsicle                                                Coffee or tea                        Coffee or tea  _____________________________________________________________________

## 2015-07-30 ENCOUNTER — Inpatient Hospital Stay (HOSPITAL_COMMUNITY)
Admission: RE | Admit: 2015-07-30 | Discharge: 2015-07-30 | Disposition: A | Discharge status: Home / Self Care | Payer: BLUE CROSS/BLUE SHIELD | Source: Ambulatory Visit

## 2015-07-31 ENCOUNTER — Encounter (HOSPITAL_COMMUNITY): Admission: RE | Payer: Self-pay | Source: Ambulatory Visit

## 2015-07-31 ENCOUNTER — Ambulatory Visit (HOSPITAL_COMMUNITY)
Admission: RE | Admit: 2015-07-31 | Payer: BLUE CROSS/BLUE SHIELD | Source: Ambulatory Visit | Admitting: Orthopedic Surgery

## 2015-07-31 SURGERY — CARPAL TUNNEL RELEASE
Anesthesia: Choice | Site: Wrist | Laterality: Right

## 2015-09-17 ENCOUNTER — Other Ambulatory Visit: Payer: Self-pay | Admitting: Registered Nurse

## 2015-09-17 ENCOUNTER — Encounter: Payer: Self-pay | Admitting: Registered Nurse

## 2015-09-17 ENCOUNTER — Encounter: Payer: BLUE CROSS/BLUE SHIELD | Attending: Physical Medicine & Rehabilitation | Admitting: Registered Nurse

## 2015-09-17 VITALS — BP 126/75 | HR 89

## 2015-09-17 DIAGNOSIS — F419 Anxiety disorder, unspecified: Secondary | ICD-10-CM | POA: Insufficient documentation

## 2015-09-17 DIAGNOSIS — M1990 Unspecified osteoarthritis, unspecified site: Secondary | ICD-10-CM | POA: Diagnosis not present

## 2015-09-17 DIAGNOSIS — G8929 Other chronic pain: Secondary | ICD-10-CM | POA: Insufficient documentation

## 2015-09-17 DIAGNOSIS — G609 Hereditary and idiopathic neuropathy, unspecified: Secondary | ICD-10-CM

## 2015-09-17 DIAGNOSIS — Z5181 Encounter for therapeutic drug level monitoring: Secondary | ICD-10-CM

## 2015-09-17 DIAGNOSIS — M25571 Pain in right ankle and joints of right foot: Secondary | ICD-10-CM | POA: Diagnosis not present

## 2015-09-17 DIAGNOSIS — M7660 Achilles tendinitis, unspecified leg: Secondary | ICD-10-CM | POA: Insufficient documentation

## 2015-09-17 DIAGNOSIS — G894 Chronic pain syndrome: Secondary | ICD-10-CM

## 2015-09-17 DIAGNOSIS — M25572 Pain in left ankle and joints of left foot: Secondary | ICD-10-CM | POA: Diagnosis not present

## 2015-09-17 DIAGNOSIS — M19272 Secondary osteoarthritis, left ankle and foot: Secondary | ICD-10-CM

## 2015-09-17 DIAGNOSIS — Z79899 Other long term (current) drug therapy: Secondary | ICD-10-CM

## 2015-09-17 DIAGNOSIS — M79671 Pain in right foot: Secondary | ICD-10-CM | POA: Diagnosis not present

## 2015-09-17 DIAGNOSIS — M79672 Pain in left foot: Secondary | ICD-10-CM | POA: Diagnosis not present

## 2015-09-17 MED ORDER — GABAPENTIN 100 MG PO CAPS: 100.0000 mg | ORAL_CAPSULE | Freq: Three times a day (TID) | ORAL | Status: DC

## 2015-09-17 MED ORDER — HYDROCODONE-ACETAMINOPHEN 7.5-325 MG PO TABS: 1.0000 | ORAL_TABLET | Freq: Three times a day (TID) | ORAL | Status: DC | PRN

## 2015-09-17 NOTE — Progress Notes (Deleted)
   Subjective:    Patient ID: Brian Rowe, male    DOB: [birth year], 39 y.o.   MRN: [id number]  HPI: [name] is a 39 year old male who returns for follow up appointment and medication refill. He says his pain is located in his right arm and left ankle. He rates his pain 6. His current exercise regime is performing stretching exercises daily and walking short distances.     Review of Systems     Objective:   Physical Exam        Assessment & Plan:   Pain Inventory Average Pain 9 Pain Right Now 8 My pain is sharp, dull, stabbing and aching  In the last 24 hours, has pain interfered with the following? General activity 1 Relation with others 1 Enjoyment of life 2 What TIME of day is your pain at its worst? night Sleep (in general) Fair  Pain is worse with: walking, bending, inactivity, standing and some activites Pain improves with: medication Relief from Meds: 1  Mobility use a cane ability to climb steps?  no do you drive?  yes  Function disabled: date disabled 08/13  Neuro/Psych weakness numbness trouble walking depression anxiety  Prior Studies {CPRM PRIOR STUDIES:21022953}  Physicians involved in your care {CPRM PHYSICIANS INVOLVED IN YOUR CARE:21022954}   Family History  Problem Relation Age of Onset  . Hypertension Father    Social History   Social History  . Marital Status: Single    Spouse Name: N/A  . Number of Children: N/A  . Years of Education: N/A   Social History Main Topics  . Smoking status: Former Smoker    Quit date: 12/06/2011  . Smokeless tobacco: None  . Alcohol Use: 0.6 oz/week    1 Cans of beer per week  . Drug Use: No  . Sexual Activity: Yes   Other Topics Concern  . None   Social History Narrative   Past Surgical History  Procedure Laterality Date  . Left ankle Left   . Throat surgery    . Nasal septum surgery     Past Medical History  Diagnosis Date  . HYPERLIPIDEMIA 07/19/2007  .  ANXIETY 07/19/2007  . DEPRESSION 07/19/2007  . VISUAL IMPAIRMENT 10/15/2007  . HYPERTENSION 07/19/2007  . HEMORRHOIDS, INTERNAL 12/01/2008  . Sleep apnea    BP 126/75 mmHg  Pulse 89  Resp 96  Opioid Risk Score:   Fall Risk Score:  `1  Depression screen PHQ 2/9  Depression screen PHQ 2/9 03/03/2015  Decreased Interest 1  Down, Depressed, Hopeless 1  PHQ - 2 Score 2  Altered sleeping 2  Tired, decreased energy 2  Change in appetite 1  Feeling bad or failure about yourself  1  Trouble concentrating 3  Moving slowly or fidgety/restless 0  Suicidal thoughts 0  PHQ-9 Score 11

## 2015-09-17 NOTE — Progress Notes (Signed)
Subjective:    Patient ID: Brian Rowe, male    DOB: [birth year], 39 y.o.   MRN: [id number]  HPI: Brian Rowe is a 39 year old male who returns for follow up appointment and medication refill. Brian Rowe says his pain is located in his left ankle and right heel. Also states Brian Rowe's not receiving relief with hydrocodone, will increase to TID. Brian Rowe verbalizes understanding.Brian Rowe rates his pain 8. His current exercise regime is performing stretching exercises daily and walking short distances.   Pain Inventory Average Pain 9 Pain Right Now 8 My pain is sharp, dull, stabbing and aching  In the last 24 hours, has pain interfered with the following? General activity 1 Relation with others 1 Enjoyment of life 2 What TIME of day is your pain at its worst? night Sleep (in general) NA  Pain is worse with: walking, bending, inactivity, standing and some activites Pain improves with: medication Relief from Meds: 1  Mobility use a cane ability to climb steps?  no do you drive?  yes  Function disabled: date disabled 2013  Neuro/Psych weakness numbness trouble walking depression anxiety  Prior Studies Any changes since last visit?  no  Physicians involved in your care Any changes since last visit?  no   Family History  Problem Relation Age of Onset  . Hypertension Father    Social History   Social History  . Marital Status: Single    Spouse Name: N/A  . Number of Children: N/A  . Years of Education: N/A   Social History Main Topics  . Smoking status: Former Smoker    Quit date: 12/06/2011  . Smokeless tobacco: None  . Alcohol Use: 0.6 oz/week    1 Cans of beer per week  . Drug Use: No  . Sexual Activity: Yes   Other Topics Concern  . None   Social History Narrative   Past Surgical History  Procedure Laterality Date  . Left ankle Left   . Throat surgery    . Nasal septum surgery     Past Medical History  Diagnosis Date  . HYPERLIPIDEMIA 07/19/2007  .  ANXIETY 07/19/2007  . DEPRESSION 07/19/2007  . VISUAL IMPAIRMENT 10/15/2007  . HYPERTENSION 07/19/2007  . HEMORRHOIDS, INTERNAL 12/01/2008  . Sleep apnea    BP 126/75 mmHg  Pulse 89  SpO2 96%  Opioid Risk Score:   Fall Risk Score:  `1  Depression screen PHQ 2/9  Depression screen PHQ 2/9 03/03/2015  Decreased Interest 1  Down, Depressed, Hopeless 1  PHQ - 2 Score 2  Altered sleeping 2  Tired, decreased energy 2  Change in appetite 1  Feeling bad or failure about yourself  1  Trouble concentrating 3  Moving slowly or fidgety/restless 0  Suicidal thoughts 0  PHQ-9 Score 11      Review of Systems  Musculoskeletal: Positive for gait problem.  Neurological: Positive for weakness and numbness.  Psychiatric/Behavioral: Positive for dysphoric mood. The patient is nervous/anxious.   All other systems reviewed and are negative.      Objective:   Physical Exam  Constitutional: Brian Rowe is oriented to person, place, and time. Brian Rowe appears well-developed and well-nourished.  HENT:  Head: Normocephalic and atraumatic.  Neck: Normal range of motion. Neck supple.  Cardiovascular: Normal rate and regular rhythm.   Pulmonary/Chest: Effort normal and breath sounds normal.  Musculoskeletal:  Normal Muscle Bulk and Muscle Testing Reveals: Upper Extremities: Full ROM and Muscle Strength 5/5 Lower Extremities: Full ROM  and Muscle Strength 5/5 Left Lower Extremity Brace Intact Arises from chair with ease Narrow Based gait  Neurological: Brian Rowe is alert and oriented to person, place, and time.  Skin: Skin is warm and dry.  Psychiatric: Brian Rowe has a normal mood and affect.  Nursing note and vitals reviewed.         Assessment & Plan:  1. Chronic foot and ankle pain, left more than right: hx of multiple foot surgeries as a child due to ?devlopmental disorder. Continue with Daily stretches Refilled: Hydrocodone 7.5/325 mg one tablet every 8 hours as needed. Increased to #90.  2. Mild achilles  tendonitis/right calcaneal spur: Follow up with Dr. Victorino Dike For left ankle/foot fusion. On hold due to financial hardship. 3. Morbid Obesity : Continue to Live Healthy Diet and Exercise regime 4. Anxiety disorder: Continue Xanax 5. Neuropathy: RX: Gabapentin Instructions Given  of face to face patient care time was spent during this visit. All questions were encouraged and answered . F/U in 2 month

## 2015-09-17 NOTE — Patient Instructions (Signed)
Start Gabapentin Tonight: Nerve Pain  One Capsule at Bedtime for 4 days  On 09/21/2015 If Pain Persists   Gabapentin one capsule in the Morning and Bedtime for 4 days  On 09/25/2015 If Pain Persists  Gabapentin Three tomes a day : Bfst, Lunch and Bedtime  If you have any questions:   Call Me [phone]

## 2015-09-18 LAB — PMP ALCOHOL METABOLITE (ETG): Ethyl Glucuronide (EtG): NEGATIVE ng/mL

## 2015-09-22 LAB — BENZODIAZEPINES (GC/LC/MS), URINE
Alprazolam metabolite (GC/LC/MS), ur confirm: 1169 ng/mL — AB (ref <25)
Clonazepam metabolite (GC/LC/MS), ur confirm: NEGATIVE ng/mL (ref <25)
Flurazepam metabolite (GC/LC/MS), ur confirm: NEGATIVE ng/mL (ref <50)
Lorazepam (GC/LC/MS), ur confirm: NEGATIVE ng/mL (ref <50)
Midazolam (GC/LC/MS), ur confirm: NEGATIVE ng/mL (ref <50)
Nordiazepam (GC/LC/MS), ur confirm: NEGATIVE ng/mL (ref <50)
Oxazepam (GC/LC/MS), ur confirm: NEGATIVE ng/mL (ref <50)
Temazepam (GC/LC/MS), ur confirm: NEGATIVE ng/mL (ref <50)
Triazolam metabolite (GC/LC/MS), ur confirm: NEGATIVE ng/mL (ref <50)

## 2015-09-22 LAB — PRESCRIPTION MONITORING PROFILE (SOLSTAS)
Amphetamine/Meth: NEGATIVE ng/mL
Barbiturate Screen, Urine: NEGATIVE ng/mL
Buprenorphine, Urine: NEGATIVE ng/mL
Cannabinoid Scrn, Ur: NEGATIVE ng/mL
Carisoprodol, Urine: NEGATIVE ng/mL
Cocaine Metabolites: NEGATIVE ng/mL
Creatinine, Urine: 443.25 mg/dL (ref >20.0)
Fentanyl, Ur: NEGATIVE ng/mL
MDMA URINE: NEGATIVE ng/mL
Meperidine, Ur: NEGATIVE ng/mL
Methadone Screen, Urine: NEGATIVE ng/mL
Nitrites, Initial: NEGATIVE ug/mL
Oxycodone Screen, Ur: NEGATIVE ng/mL
Propoxyphene: NEGATIVE ng/mL
Tapentadol, urine: NEGATIVE ng/mL
Tramadol Scrn, Ur: NEGATIVE ng/mL
Zolpidem, Urine: NEGATIVE ng/mL
pH, Initial: 6.5 pH (ref 4.5–8.9)

## 2015-09-22 LAB — OPIATES/OPIOIDS (LC/MS-MS)
Codeine Urine: NEGATIVE ng/mL (ref <50)
Hydrocodone: 1561 ng/mL (ref <50)
Hydromorphone: 408 ng/mL (ref <50)
Morphine Urine: NEGATIVE ng/mL (ref <50)
Norhydrocodone, Ur: 2442 ng/mL (ref <50)
Noroxycodone, Ur: NEGATIVE ng/mL (ref <50)
Oxycodone, ur: NEGATIVE ng/mL (ref <50)
Oxymorphone: NEGATIVE ng/mL (ref <50)

## 2015-10-09 NOTE — Progress Notes (Signed)
Urine drug screen for this encounter is consistent for prescribed medication 

## 2015-10-13 ENCOUNTER — Encounter: Payer: Self-pay | Admitting: Physical Medicine & Rehabilitation

## 2015-10-13 ENCOUNTER — Encounter
Payer: BLUE CROSS/BLUE SHIELD | Attending: Physical Medicine & Rehabilitation | Admitting: Physical Medicine & Rehabilitation

## 2015-10-13 VITALS — BP 156/95 | HR 86

## 2015-10-13 DIAGNOSIS — G5603 Carpal tunnel syndrome, bilateral upper limbs: Secondary | ICD-10-CM | POA: Diagnosis not present

## 2015-10-13 DIAGNOSIS — M7661 Achilles tendinitis, right leg: Secondary | ICD-10-CM

## 2015-10-13 DIAGNOSIS — M19072 Primary osteoarthritis, left ankle and foot: Secondary | ICD-10-CM | POA: Diagnosis not present

## 2015-10-13 DIAGNOSIS — M25572 Pain in left ankle and joints of left foot: Secondary | ICD-10-CM | POA: Insufficient documentation

## 2015-10-13 DIAGNOSIS — M79671 Pain in right foot: Secondary | ICD-10-CM | POA: Insufficient documentation

## 2015-10-13 DIAGNOSIS — M1990 Unspecified osteoarthritis, unspecified site: Secondary | ICD-10-CM | POA: Insufficient documentation

## 2015-10-13 DIAGNOSIS — G8929 Other chronic pain: Secondary | ICD-10-CM | POA: Diagnosis present

## 2015-10-13 DIAGNOSIS — M25571 Pain in right ankle and joints of right foot: Secondary | ICD-10-CM | POA: Diagnosis not present

## 2015-10-13 DIAGNOSIS — M7660 Achilles tendinitis, unspecified leg: Secondary | ICD-10-CM | POA: Diagnosis not present

## 2015-10-13 DIAGNOSIS — F419 Anxiety disorder, unspecified: Secondary | ICD-10-CM | POA: Insufficient documentation

## 2015-10-13 DIAGNOSIS — M79672 Pain in left foot: Secondary | ICD-10-CM | POA: Diagnosis not present

## 2015-10-13 MED ORDER — OXYCODONE-ACETAMINOPHEN 7.5-325 MG PO TABS
1.0000 | ORAL_TABLET | Freq: Three times a day (TID) | ORAL | Status: DC | PRN
Start: 2015-10-13 — End: 2015-11-10

## 2015-10-13 NOTE — Progress Notes (Signed)
Subjective:    Patient ID: Brian Rowe, male    DOB: July 25, 1976, 39 y.o.   MRN: [id number]  HPI   Greig Castillandrew is here in follow up of his chronic pain. The gabapentin is helping with his radiculopathy. The hydrocodone isn't really helping a great deal---it just makes him feel drunk. He is still using the his left sided orthotic although the left foot continues to roll when he walks which is leading to ankle/knee/hip pain.   He was started on trintellix recently for depression and anxiety. He is also on xanax 2mg  TID prn anxiety  He states he's had NCS of his wrists---only remembers EMG part of test. 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     Pain Inventory Average Pain 8 Pain Right Now 8 My pain is sharp, dull, stabbing and aching  In the last 24 hours, has pain interfered with the following? General activity 2 Relation with others 1 Enjoyment of life 1 What TIME of day is your pain at its worst? daytime Sleep (in general) NA  Pain is worse with: walking, inactivity, standing and some activites Pain improves with: rest, medication and injections Relief from Meds: 2  Mobility walk with assistance use a cane how many minutes can you walk? 15 ability to climb steps?  no do you drive?  yes  Function disabled: date disabled 07/2010 I need assistance with the following:  household duties and shopping  Neuro/Psych numbness depression anxiety  Prior Studies Any changes since last visit?  no  Physicians involved in your care Any changes since last visit?  yes   Family History  Problem Relation Age of Onset  . Hypertension Father    Social History   Social History  . Marital Status: Single    Spouse Name: N/A  . Number of Children: N/A  . Years of Education: N/A   Social History Main Topics  . Smoking status: Former Smoker    Quit date: 12/06/2011  . Smokeless tobacco: None  . Alcohol Use: 0.6 oz/week    1 Cans of beer per week  . Drug  Use: No  . Sexual Activity: Yes   Other Topics Concern  . None   Social History Narrative   Past Surgical History  Procedure Laterality Date  . Left ankle Left   . Throat surgery    . Nasal septum surgery     Past Medical History  Diagnosis Date  . HYPERLIPIDEMIA 07/19/2007  . ANXIETY 07/19/2007  . DEPRESSION 07/19/2007  . VISUAL IMPAIRMENT 10/15/2007  . HYPERTENSION 07/19/2007  . HEMORRHOIDS, INTERNAL 12/01/2008  . Sleep apnea    BP 156/95 mmHg  Pulse 86  SpO2 98%  Opioid Risk Score:   Fall Risk Score:  `1  Depression screen PHQ 2/9  Depression screen PHQ 2/9 03/03/2015  Decreased Interest 1  Down, Depressed, Hopeless 1  PHQ - 2 Score 2  Altered sleeping 2  Tired, decreased energy 2  Change in appetite 1  Feeling bad or failure about yourself  1  Trouble concentrating 3  Moving slowly or fidgety/restless 0  Suicidal thoughts 0  PHQ-9 Score 11     Review of Systems  Neurological: Positive for numbness.  Psychiatric/Behavioral: The patient is nervous/anxious.        Depression  All other systems reviewed and are negative.      Objective:   Physical Exam General: Alert and oriented x 3, No apparent distress. Obese large framed  HEENT:  Head is normocephalic, atraumatic, PERRLA, EOMI, sclera anicteric, oral mucosa pink and moist, dentition intact, ext ear canals clear,  Neck: Supple without JVD or lymphadenopathy  Heart: Reg rate and rhythm. No murmurs rubs or gallops  Chest: CTA bilaterally without wheezes, rales, or rhonchi; no distress  Abdomen: Soft, non-tender, non-distended, bowel sounds positive.  Extremities: No clubbing, cyanosis, or edema. Pulses are 2+  Skin: Clean and intact without signs of breakdown  Neuro: Pt is cognitively appropriate with normal insight, memory, and awareness. Cranial nerves 2-12 are intact. Sensory exam is normal---no obvious abnormalities in the left foot either. Reflexes are 2+ in all 4's. Fine motor coordination is intact.  No tremors. Motor function is grossly 5/5 in all 4's with some pain inhibition weakness in the left foot. Mild sensory loss to LT on right calcaneus Musculoskeletal: Full ROM, No pain with AROM or PROM in the neck, trunk, or upper extremities. he has a high arch on the left foot. He has pain with ankle dorsiflexion, inv, evers. Mild pain over the achilles and calcaneus. Right heel tender along the posterior calcaneus and achilles insertion once again. Both achilles were tight but feet could be ranged to neutral. He's antalgic with gait more on the left than right. Varus deformity left ankle noted. He tends to rest on the lateral side of his foot in supination  Psych: Pt's affect is appropriate. Pt is cooperative. Talkative but very pleasant    Assessment & Plan:   1. Chronic foot and ankle pain, left more than right---hx of multiple foot surgeries as a child due to ?devlopmental disorder. 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  2. Mild achilles tendonitis, ?right calcaneal spur  3. Morbid Obesity  4. Anxiety disorder   Plan:  1.Continued weight loss should benefit him more and more.  2. Recommend bilateral CTS splints if he's not intending on having surgery 3. Discussed nerve entrapment in right foot. ?medial calcaneal nerve--observe only 4. Change to percocet 7.5/325, one q8 prn #60 5. Surgical follow up per Dr. Victorino Dike. Regarding ankles/feet 6. Continue gabapentin for leg pain. May help wrists also.  qhs 7.Follow up with me or NP in about a month. 20 minutes of face to face patient care time were spent during this visit. All questions were encouraged and answered.

## 2015-10-13 NOTE — Patient Instructions (Signed)
WEAR WRIST SPLINTS AT NIGHT TIME AND DURING THE DAY IF YOU TOLERATE

## 2015-11-10 ENCOUNTER — Encounter: Payer: Self-pay | Admitting: Registered Nurse

## 2015-11-10 ENCOUNTER — Encounter: Payer: BLUE CROSS/BLUE SHIELD | Attending: Physical Medicine & Rehabilitation | Admitting: Registered Nurse

## 2015-11-10 VITALS — BP 139/89 | HR 88 | Resp 16

## 2015-11-10 DIAGNOSIS — M25571 Pain in right ankle and joints of right foot: Secondary | ICD-10-CM | POA: Insufficient documentation

## 2015-11-10 DIAGNOSIS — G5603 Carpal tunnel syndrome, bilateral upper limbs: Secondary | ICD-10-CM

## 2015-11-10 DIAGNOSIS — M1990 Unspecified osteoarthritis, unspecified site: Secondary | ICD-10-CM | POA: Insufficient documentation

## 2015-11-10 DIAGNOSIS — M7660 Achilles tendinitis, unspecified leg: Secondary | ICD-10-CM | POA: Insufficient documentation

## 2015-11-10 DIAGNOSIS — G894 Chronic pain syndrome: Secondary | ICD-10-CM | POA: Diagnosis not present

## 2015-11-10 DIAGNOSIS — M79671 Pain in right foot: Secondary | ICD-10-CM | POA: Diagnosis not present

## 2015-11-10 DIAGNOSIS — M7661 Achilles tendinitis, right leg: Secondary | ICD-10-CM

## 2015-11-10 DIAGNOSIS — M25572 Pain in left ankle and joints of left foot: Secondary | ICD-10-CM | POA: Diagnosis not present

## 2015-11-10 DIAGNOSIS — G8929 Other chronic pain: Secondary | ICD-10-CM | POA: Insufficient documentation

## 2015-11-10 DIAGNOSIS — M19072 Primary osteoarthritis, left ankle and foot: Secondary | ICD-10-CM

## 2015-11-10 DIAGNOSIS — M79672 Pain in left foot: Secondary | ICD-10-CM | POA: Insufficient documentation

## 2015-11-10 DIAGNOSIS — Z5181 Encounter for therapeutic drug level monitoring: Secondary | ICD-10-CM | POA: Diagnosis not present

## 2015-11-10 DIAGNOSIS — F419 Anxiety disorder, unspecified: Secondary | ICD-10-CM | POA: Insufficient documentation

## 2015-11-10 DIAGNOSIS — Z79899 Other long term (current) drug therapy: Secondary | ICD-10-CM

## 2015-11-10 MED ORDER — OXYCODONE-ACETAMINOPHEN 7.5-325 MG PO TABS: 1.0000 | ORAL_TABLET | Freq: Three times a day (TID) | ORAL | Status: DC | PRN

## 2015-11-10 NOTE — Progress Notes (Signed)
Subjective:    Patient ID: Brian Rowe, male    DOB: 30-Jan-1976, 39 y.o.   MRN: [id number]  HPI: [name] is a 39 year old male who returns for follow up appointment and medication refill. He says his pain is located in his left ankle and right heel. His medication was changed last month to Percocet 7.5 mg one tablet every 8 hours #60. He's only receiving relief for 5 hours. We will increase his tablets today he verbalizes understanding. He rates his pain 7. His current exercise regime is performing stretching exercises daily and walking short distances.   Pain Inventory Average Pain 4 Pain Right Now 7 My pain is sharp, dull, stabbing and aching  In the last 24 hours, has pain interfered with the following? General activity 3 Relation with others 1 Enjoyment of life 2 What TIME of day is your pain at its worst? morning Sleep (in general) Poor  Pain is worse with: walking, bending, sitting, inactivity, standing and some activites Pain improves with: rest, therapy/exercise, medication and injections Relief from Meds: 4  Mobility walk without assistance walk with assistance use a cane  Function disabled: date disabled 07/2012 I need assistance with the following:  household duties and shopping Do you have any goals in this area?  yes  Neuro/Psych depression anxiety  Prior Studies Any changes since last visit?  no  Physicians involved in your care Any changes since last visit?  no   Family History  Problem Relation Age of Onset  . Hypertension Father    Social History   Social History  . Marital Status: Single    Spouse Name: N/A  . Number of Children: N/A  . Years of Education: N/A   Social History Main Topics  . Smoking status: Former Smoker    Quit date: 12/06/2011  . Smokeless tobacco: None  . Alcohol Use: 0.6 oz/week    1 Cans of beer per week  . Drug Use: No  . Sexual Activity: Yes   Other Topics Concern  . None   Social History  Narrative   Past Surgical History  Procedure Laterality Date  . Left ankle Left   . Throat surgery    . Nasal septum surgery     Past Medical History  Diagnosis Date  . HYPERLIPIDEMIA 07/19/2007  . ANXIETY 07/19/2007  . DEPRESSION 07/19/2007  . VISUAL IMPAIRMENT 10/15/2007  . HYPERTENSION 07/19/2007  . HEMORRHOIDS, INTERNAL 12/01/2008  . Sleep apnea    BP 139/89 mmHg  Pulse 88  Resp 16  SpO2 93%  Opioid Risk Score:   Fall Risk Score:  `1  Depression screen PHQ 2/9  Depression screen PHQ 2/9 03/03/2015  Decreased Interest 1  Down, Depressed, Hopeless 1  PHQ - 2 Score 2  Altered sleeping 2  Tired, decreased energy 2  Change in appetite 1  Feeling bad or failure about yourself  1  Trouble concentrating 3  Moving slowly or fidgety/restless 0  Suicidal thoughts 0  PHQ-9 Score 11     Review of Systems  Psychiatric/Behavioral: Positive for dysphoric mood. The patient is nervous/anxious.   All other systems reviewed and are negative.      Objective:   Physical Exam  Constitutional: He is oriented to person, place, and time. He appears well-developed and well-nourished.  HENT:  Head: Normocephalic and atraumatic.  Neck: Normal range of motion. Neck supple.  Cardiovascular: Normal rate and regular rhythm.   Pulmonary/Chest: Effort normal and breath  sounds normal.  Musculoskeletal:  Normal Muscle Bulk and Muscle Testing Reveals: Upper Extremities: Full ROM and Muscle Strength 5/5 Lower Extremities: Full ROM and Muscle Strength 5/5 Left Ankle Brace Intact Arises from chair with ease Antalgic gait    Neurological: He is alert and oriented to person, place, and time.  Skin: Skin is warm and dry.  Psychiatric: He has a normal mood and affect.  Nursing note and vitals reviewed.         Assessment & Plan:  1. Chronic foot and ankle pain, left more than right: hx of multiple foot surgeries as a child due to ?devlopmental disorder. Continue with Daily  stretches Refilled: Percocet 7.5/325 mg one tablet every 8 hours as needed. Increased to #90.  2. Mild achilles tendonitis/right calcaneal spur: Follow up with Dr. Victorino Dike For left ankle/foot fusion. On hold due to financial hardship. 3. Morbid Obesity : Continue to Live Healthy Diet and Exercise regime 4. Anxiety disorder: Continue Xanax 5. Neuropathy: Continue Gabapentin   of face to face patient care time was spent during this visit. All questions were encouraged and answered . F/U in 1  month

## 2015-11-11 ENCOUNTER — Telehealth: Payer: Self-pay

## 2015-11-11 NOTE — Telephone Encounter (Signed)
That would be fine---one time only however

## 2015-11-11 NOTE — Telephone Encounter (Signed)
Patient called in today, stating that he will be unable to continue to come to this clinic any longer. He states that the Facility charge is too much for him to be able to afford. He states that he will transferring his care to a new pain management provider and his 1st appointment is on January 18th 2017. He states that he would like to pick up a scrip for his pain medication (Oxycodone 7.5-325mg ). He states that he will be out January 3rd and will need just enough to get him to his January 18th, 2017 appointment. Please Advise.

## 2015-11-12 NOTE — Telephone Encounter (Signed)
Left message for Mr Brian Rowe to call back and clarify when he will actually be needing Rx.  By [employer] last fill was 10/13/15. Uncertain by note if he got two RX at last appt.

## 2015-12-01 ENCOUNTER — Telehealth: Payer: Self-pay | Admitting: Physical Medicine & Rehabilitation

## 2015-12-01 DIAGNOSIS — M19072 Primary osteoarthritis, left ankle and foot: Secondary | ICD-10-CM

## 2015-12-01 DIAGNOSIS — G5603 Carpal tunnel syndrome, bilateral upper limbs: Secondary | ICD-10-CM

## 2015-12-01 NOTE — Telephone Encounter (Signed)
Please advise 

## 2015-12-01 NOTE — Telephone Encounter (Signed)
Patient is going to start a new pain clinic on 12/23/15 and would like to know if we could write his prescriptions to last him until this visit.  He can no longer afford to come here with the 2 bills that he gets and that is why is switching pain clinics.  Please call patient [phone].

## 2015-12-08 MED ORDER — OXYCODONE-ACETAMINOPHEN 7.5-325 MG PO TABS: 1.0000 | ORAL_TABLET | Freq: Three times a day (TID) | ORAL | Status: DC | PRN

## 2015-12-08 NOTE — Telephone Encounter (Signed)
Left message for pt to pick up rx

## 2015-12-08 NOTE — Telephone Encounter (Signed)
Ok, rx'es written

## 2015-12-31 ENCOUNTER — Encounter: Payer: Self-pay | Admitting: Registered Nurse

## 2015-12-31 ENCOUNTER — Encounter: Payer: BLUE CROSS/BLUE SHIELD | Attending: Physical Medicine & Rehabilitation | Admitting: Registered Nurse

## 2015-12-31 VITALS — BP 152/84 | HR 99 | Resp 14

## 2015-12-31 DIAGNOSIS — M79672 Pain in left foot: Secondary | ICD-10-CM | POA: Insufficient documentation

## 2015-12-31 DIAGNOSIS — M7660 Achilles tendinitis, unspecified leg: Secondary | ICD-10-CM | POA: Diagnosis not present

## 2015-12-31 DIAGNOSIS — F419 Anxiety disorder, unspecified: Secondary | ICD-10-CM | POA: Diagnosis not present

## 2015-12-31 DIAGNOSIS — G894 Chronic pain syndrome: Secondary | ICD-10-CM

## 2015-12-31 DIAGNOSIS — G5603 Carpal tunnel syndrome, bilateral upper limbs: Secondary | ICD-10-CM

## 2015-12-31 DIAGNOSIS — M79671 Pain in right foot: Secondary | ICD-10-CM | POA: Insufficient documentation

## 2015-12-31 DIAGNOSIS — M1990 Unspecified osteoarthritis, unspecified site: Secondary | ICD-10-CM | POA: Insufficient documentation

## 2015-12-31 DIAGNOSIS — Z79899 Other long term (current) drug therapy: Secondary | ICD-10-CM | POA: Diagnosis not present

## 2015-12-31 DIAGNOSIS — M19072 Primary osteoarthritis, left ankle and foot: Secondary | ICD-10-CM

## 2015-12-31 DIAGNOSIS — M25571 Pain in right ankle and joints of right foot: Secondary | ICD-10-CM | POA: Diagnosis not present

## 2015-12-31 DIAGNOSIS — G8929 Other chronic pain: Secondary | ICD-10-CM | POA: Insufficient documentation

## 2015-12-31 DIAGNOSIS — M25572 Pain in left ankle and joints of left foot: Secondary | ICD-10-CM | POA: Diagnosis not present

## 2015-12-31 DIAGNOSIS — Z5181 Encounter for therapeutic drug level monitoring: Secondary | ICD-10-CM

## 2015-12-31 DIAGNOSIS — M7661 Achilles tendinitis, right leg: Secondary | ICD-10-CM

## 2015-12-31 MED ORDER — OXYCODONE-ACETAMINOPHEN 7.5-325 MG PO TABS: 1.0000 | ORAL_TABLET | Freq: Three times a day (TID) | ORAL | Status: DC | PRN

## 2015-12-31 NOTE — Progress Notes (Signed)
Subjective:    Patient ID: Brian Rowe, male    DOB: [birth year], 40 y.o.   MRN: [id number]  HPI: [name] is a 40 year old male who returns for follow up appointment and medication refill. He says his pain is located in his left ankle and right heel. He rates his pain 7. His current exercise regime is performing stretching exercises daily and walking short distances.   Pain Inventory Average Pain 9 Pain Right Now 7 My pain is sharp, dull, stabbing and aching  In the last 24 hours, has pain interfered with the following? General activity 3 Relation with others 2 Enjoyment of life 3 What TIME of day is your pain at its worst? daytime Sleep (in general) Poor  Pain is worse with: walking Pain improves with: medication Relief from Meds: 3  Mobility walk with assistance use a cane how many minutes can you walk? 15 ability to climb steps?  no do you drive?  yes  Function disabled: date disabled .  Neuro/Psych depression anxiety  Prior Studies Any changes since last visit?  no  Physicians involved in your care Any changes since last visit?  no   Family History  Problem Relation Age of Onset  . Hypertension Father    Social History   Social History  . Marital Status: Single    Spouse Name: N/A  . Number of Children: N/A  . Years of Education: N/A   Social History Main Topics  . Smoking status: Former Smoker    Quit date: 12/06/2011  . Smokeless tobacco: None  . Alcohol Use: 0.6 oz/week    1 Cans of beer per week  . Drug Use: No  . Sexual Activity: Yes   Other Topics Concern  . None   Social History Narrative   Past Surgical History  Procedure Laterality Date  . Left ankle Left   . Throat surgery    . Nasal septum surgery     Past Medical History  Diagnosis Date  . HYPERLIPIDEMIA 07/19/2007  . ANXIETY 07/19/2007  . DEPRESSION 07/19/2007  . VISUAL IMPAIRMENT 10/15/2007  . HYPERTENSION 07/19/2007  . HEMORRHOIDS, INTERNAL 12/01/2008    . Sleep apnea    BP 152/84 mmHg  Pulse 99  Resp 14  SpO2 96%  Opioid Risk Score:   Fall Risk Score:  `1  Depression screen PHQ 2/9  Depression screen PHQ 2/9 03/03/2015  Decreased Interest 1  Down, Depressed, Hopeless 1  PHQ - 2 Score 2  Altered sleeping 2  Tired, decreased energy 2  Change in appetite 1  Feeling bad or failure about yourself  1  Trouble concentrating 3  Moving slowly or fidgety/restless 0  Suicidal thoughts 0  PHQ-9 Score 11     Review of Systems  All other systems reviewed and are negative.      Objective:   Physical Exam  Constitutional: He is oriented to person, place, and time. He appears well-developed and well-nourished.  HENT:  Head: Normocephalic and atraumatic.  Neck: Normal range of motion. Neck supple.  Cardiovascular: Normal rate and regular rhythm.   Pulmonary/Chest: Effort normal and breath sounds normal.  Musculoskeletal:  Normal Muscle Bulk and Muscle Testing Reveals: Upper Extremities: Full ROM and Muscle Strength 5/5 Lower Extremities: Full ROM and Muscle Strength 5/5 Left: Lower Extremity Flexion Produces Pain into Left Foot Arises from chair with ease Narrow Based Gait  Neurological: He is alert and oriented to person, place, and time.  Skin:  Skin is warm and dry.  Psychiatric: He has a normal mood and affect.  Nursing note and vitals reviewed.         Assessment & Plan:  1. Chronic foot and ankle pain, left more than right: hx of multiple foot surgeries as a child due to ?devlopmental disorder. Continue with Daily stretches Refilled: Percocet 7.5/325 mg one tablet every 8 hours as needed #90.  2. Mild achilles tendonitis/right calcaneal spur: Follow up with Dr. Victorino Dike For left ankle/foot fusion. On hold due to financial hardship. 3. Morbid Obesity : Continue to Live Healthy Diet and Exercise regime 4. Anxiety disorder: Continue Xanax 5. Neuropathy: Continue Gabapentin   of face to face patient care  time was spent during this visit. All questions were encouraged and answered . F/U in 1 month

## 2016-01-07 LAB — TOXASSURE SELECT,+ANTIDEPR,UR: PDF: 0

## 2016-01-08 NOTE — Progress Notes (Signed)
Urine drug screen for this encounter is consistent for prescribed medication 

## 2016-02-01 ENCOUNTER — Encounter: Payer: Self-pay | Admitting: Registered Nurse

## 2016-02-01 ENCOUNTER — Encounter: Payer: BLUE CROSS/BLUE SHIELD | Attending: Physical Medicine & Rehabilitation | Admitting: Registered Nurse

## 2016-02-01 VITALS — BP 145/92 | HR 79

## 2016-02-01 DIAGNOSIS — M1990 Unspecified osteoarthritis, unspecified site: Secondary | ICD-10-CM | POA: Diagnosis not present

## 2016-02-01 DIAGNOSIS — M79671 Pain in right foot: Secondary | ICD-10-CM | POA: Insufficient documentation

## 2016-02-01 DIAGNOSIS — M19072 Primary osteoarthritis, left ankle and foot: Secondary | ICD-10-CM | POA: Diagnosis not present

## 2016-02-01 DIAGNOSIS — G894 Chronic pain syndrome: Secondary | ICD-10-CM | POA: Diagnosis not present

## 2016-02-01 DIAGNOSIS — M25572 Pain in left ankle and joints of left foot: Secondary | ICD-10-CM | POA: Diagnosis not present

## 2016-02-01 DIAGNOSIS — M7660 Achilles tendinitis, unspecified leg: Secondary | ICD-10-CM | POA: Diagnosis not present

## 2016-02-01 DIAGNOSIS — G8929 Other chronic pain: Secondary | ICD-10-CM | POA: Insufficient documentation

## 2016-02-01 DIAGNOSIS — Z5181 Encounter for therapeutic drug level monitoring: Secondary | ICD-10-CM | POA: Diagnosis not present

## 2016-02-01 DIAGNOSIS — M79672 Pain in left foot: Secondary | ICD-10-CM | POA: Diagnosis not present

## 2016-02-01 DIAGNOSIS — M25571 Pain in right ankle and joints of right foot: Secondary | ICD-10-CM | POA: Insufficient documentation

## 2016-02-01 DIAGNOSIS — F419 Anxiety disorder, unspecified: Secondary | ICD-10-CM | POA: Diagnosis not present

## 2016-02-01 DIAGNOSIS — M7661 Achilles tendinitis, right leg: Secondary | ICD-10-CM | POA: Diagnosis not present

## 2016-02-01 DIAGNOSIS — Z79899 Other long term (current) drug therapy: Secondary | ICD-10-CM

## 2016-02-01 DIAGNOSIS — G5603 Carpal tunnel syndrome, bilateral upper limbs: Secondary | ICD-10-CM

## 2016-02-01 MED ORDER — OXYCODONE-ACETAMINOPHEN 7.5-325 MG PO TABS
1.0000 | ORAL_TABLET | Freq: Three times a day (TID) | ORAL | Status: DC | PRN
Start: 2016-02-01 — End: 2016-03-10

## 2016-02-01 NOTE — Progress Notes (Signed)
Subjective:    Patient ID: Brian Rowe, male    DOB: 23-Jul-1976, 40 y.o.   MRN: [id number]  HPI: Mr. KALEEL SCHMIEDER is a 40 year old male who returns for follow up appointment and medication refill. He says his pain is located in his left ankle and right heel. He rates his pain 8. His current exercise regime is performing stretching exercises daily and walking short distances.   Pain Inventory Average Pain 7 Pain Right Now 8 My pain is sharp, dull, stabbing and aching  In the last 24 hours, has pain interfered with the following? General activity 3 Relation with others 2 Enjoyment of life 3 What TIME of day is your pain at its worst? daytime Sleep (in general) NA  Pain is worse with: walking, bending, inactivity, standing and some activites Pain improves with: rest, medication and injections Relief from Meds: not answered  Mobility walk without assistance  Function disabled: date disabled . I need assistance with the following:  household duties  Neuro/Psych depression anxiety  Prior Studies Any changes since last visit?  no  Physicians involved in your care Any changes since last visit?  no   Family History  Problem Relation Age of Onset  . Hypertension Father    Social History   Social History  . Marital Status: Single    Spouse Name: N/A  . Number of Children: N/A  . Years of Education: N/A   Social History Main Topics  . Smoking status: Former Smoker    Quit date: 12/06/2011  . Smokeless tobacco: None  . Alcohol Use: 0.6 oz/week    1 Cans of beer per week  . Drug Use: No  . Sexual Activity: Yes   Other Topics Concern  . None   Social History Narrative   Past Surgical History  Procedure Laterality Date  . Left ankle Left   . Throat surgery    . Nasal septum surgery     Past Medical History  Diagnosis Date  . HYPERLIPIDEMIA 07/19/2007  . ANXIETY 07/19/2007  . DEPRESSION 07/19/2007  . VISUAL IMPAIRMENT 10/15/2007  . HYPERTENSION  07/19/2007  . HEMORRHOIDS, INTERNAL 12/01/2008  . Sleep apnea    BP 145/92 mmHg  Pulse 79  SpO2 95%  Opioid Risk Score:   Fall Risk Score:  `1  Depression screen PHQ 2/9  Depression screen [facility] 2/9 02/01/2016 03/03/2015  Decreased Interest - 1  Down, Depressed, Hopeless - 1  PHQ - 2 Score - 2  Altered sleeping 1 2  Tired, decreased energy 1 2  Change in appetite - 1  Feeling bad or failure about yourself  - 1  Trouble concentrating - 3  Moving slowly or fidgety/restless - 0  Suicidal thoughts - 0  PHQ-9 Score - 11     Review of Systems  All other systems reviewed and are negative.      Objective:   Physical Exam  Constitutional: He is oriented to person, place, and time. He appears well-developed and well-nourished.  HENT:  Head: Normocephalic and atraumatic.  Neck: Normal range of motion. Neck supple.  Cardiovascular: Normal rate and regular rhythm.   Pulmonary/Chest: Effort normal and breath sounds normal.  Musculoskeletal:  Normal Muscle Bulk and Muscle Testing Reveals: Upper Extremities: Full ROM and Muscle Strength 5/5 Lower Extremities: Full ROM and Muscle Strength 5/5 Left Lower Extremity Flexion Produces Pain into Left Ankle Left Ankle Brace Intact Arises from chair with ease Narrow Based Gait  Neurological: He is  alert and oriented to person, place, and time.  Skin: Skin is warm and dry.  Psychiatric: He has a normal mood and affect.  Nursing note and vitals reviewed.         Assessment & Plan:  1. Chronic foot and ankle pain, left more than right: hx of multiple foot surgeries as a child due to ?devlopmental disorder. Continue with Daily stretches Refilled: Percocet 7.5/325 mg one tablet every 8 hours as needed #90.  2. Mild achilles tendonitis/right calcaneal spur: Follow up with Dr. Victorino Dike For left ankle/foot fusion. On hold due to financial hardship. 3. Morbid Obesity : Continue to Live Healthy Diet and Exercise regime 4. Anxiety disorder:  Continue Xanax 5. Neuropathy: Continue Gabapentin   of face to face patient care time was spent during this visit. All questions were encouraged and answered . F/U in 1 month

## 2016-02-02 ENCOUNTER — Ambulatory Visit: Payer: BLUE CROSS/BLUE SHIELD | Admitting: Internal Medicine

## 2016-02-24 ENCOUNTER — Telehealth: Payer: Self-pay | Admitting: Physical Medicine & Rehabilitation

## 2016-02-24 NOTE — Telephone Encounter (Signed)
Patient wants Brian Rowe to write him a letter for his disability claim.  Any questions please call patient.

## 2016-02-24 NOTE — Telephone Encounter (Signed)
Spoke with [name], he wanted a letter stating why he is being seen in our office. He wanted Dr. [name] to address his ambulatory function. An appointment has been scheduled with Dr. [name], he verbalizes understanding.

## 2016-02-24 NOTE — Telephone Encounter (Signed)
Did you discuss doing this with pt?

## 2016-03-01 ENCOUNTER — Ambulatory Visit: Payer: BLUE CROSS/BLUE SHIELD | Admitting: Registered Nurse

## 2016-03-10 ENCOUNTER — Encounter: Payer: BLUE CROSS/BLUE SHIELD | Attending: Physical Medicine & Rehabilitation | Admitting: Registered Nurse

## 2016-03-10 ENCOUNTER — Encounter: Payer: BLUE CROSS/BLUE SHIELD | Admitting: Registered Nurse

## 2016-03-10 ENCOUNTER — Ambulatory Visit: Payer: BLUE CROSS/BLUE SHIELD | Admitting: Registered Nurse

## 2016-03-10 ENCOUNTER — Encounter: Payer: Self-pay | Admitting: Registered Nurse

## 2016-03-10 VITALS — BP 133/87 | HR 77 | Resp 14

## 2016-03-10 DIAGNOSIS — M79672 Pain in left foot: Secondary | ICD-10-CM | POA: Diagnosis not present

## 2016-03-10 DIAGNOSIS — Z5181 Encounter for therapeutic drug level monitoring: Secondary | ICD-10-CM

## 2016-03-10 DIAGNOSIS — M19072 Primary osteoarthritis, left ankle and foot: Secondary | ICD-10-CM | POA: Diagnosis not present

## 2016-03-10 DIAGNOSIS — G8929 Other chronic pain: Secondary | ICD-10-CM | POA: Insufficient documentation

## 2016-03-10 DIAGNOSIS — M79671 Pain in right foot: Secondary | ICD-10-CM | POA: Insufficient documentation

## 2016-03-10 DIAGNOSIS — F419 Anxiety disorder, unspecified: Secondary | ICD-10-CM | POA: Insufficient documentation

## 2016-03-10 DIAGNOSIS — M1990 Unspecified osteoarthritis, unspecified site: Secondary | ICD-10-CM | POA: Insufficient documentation

## 2016-03-10 DIAGNOSIS — M25572 Pain in left ankle and joints of left foot: Secondary | ICD-10-CM | POA: Diagnosis not present

## 2016-03-10 DIAGNOSIS — M25571 Pain in right ankle and joints of right foot: Secondary | ICD-10-CM | POA: Insufficient documentation

## 2016-03-10 DIAGNOSIS — M7661 Achilles tendinitis, right leg: Secondary | ICD-10-CM | POA: Diagnosis not present

## 2016-03-10 DIAGNOSIS — Z79899 Other long term (current) drug therapy: Secondary | ICD-10-CM

## 2016-03-10 DIAGNOSIS — G894 Chronic pain syndrome: Secondary | ICD-10-CM

## 2016-03-10 DIAGNOSIS — G5603 Carpal tunnel syndrome, bilateral upper limbs: Secondary | ICD-10-CM

## 2016-03-10 DIAGNOSIS — M7660 Achilles tendinitis, unspecified leg: Secondary | ICD-10-CM | POA: Diagnosis not present

## 2016-03-10 MED ORDER — OXYCODONE-ACETAMINOPHEN 7.5-325 MG PO TABS: 1.0000 | ORAL_TABLET | Freq: Three times a day (TID) | ORAL | Status: DC | PRN

## 2016-03-10 NOTE — Progress Notes (Signed)
Subjective:    Patient ID: Brian Rowe, male    DOB: 10/20/1976, 40 y.o.   MRN: [id number]  HPI: Brian Rowe is a 40 year old male who returns for follow up appointment and medication refill. He states his pain is located in his lower back mainly right side, left ankle and right heel. He rates his pain 8. His current exercise regime is performing stretching exercises daily and walking short distances.  Also states three weeks ago he was walking in his uncle yard when his foot became lodged in a vine and he fell on his left side. He was able to pick himself up, he didn't seek medical attention.  Brian Rowe forgot his medication bottle, reviewed the narcotic policy with him, he is aware if this happens again he will not be given a prescription until he returns to the office with his medication bottle, he verbalizes understanding. According to [facility] his oxycodone was filled on 02/02/16, his pharmacy [facility] was also called and the above date is correct.  Pain Inventory Average Pain 7 Pain Right Now 8 My pain is sharp, dull, stabbing and aching  In the last 24 hours, has pain interfered with the following? General activity 2 Relation with others 2 Enjoyment of life 3 What TIME of day is your pain at its worst? daytime Sleep (in general) NA  Pain is worse with: walking, bending, inactivity, standing and some activites Pain improves with: rest, medication and injections Relief from Meds: 4  Mobility walk without assistance ability to climb steps?  no do you drive?  yes Do you have any goals in this area?  yes  Function not employed: date last employed 08/13 I need assistance with the following:  household duties and shopping  Neuro/Psych depression anxiety  Prior Studies bone scan x-rays nerve study  Physicians involved in your care Primary care . Psychiatrist . Orthopedist .   Family History  Problem Relation Age of Onset  . Hypertension Father     Social History   Social History  . Marital Status: Single    Spouse Name: N/A  . Number of Children: N/A  . Years of Education: N/A   Social History Main Topics  . Smoking status: Former Smoker    Quit date: 12/06/2011  . Smokeless tobacco: None  . Alcohol Use: 0.6 oz/week    1 Cans of beer per week  . Drug Use: No  . Sexual Activity: Yes   Other Topics Concern  . None   Social History Narrative   Past Surgical History  Procedure Laterality Date  . Left ankle Left   . Throat surgery    . Nasal septum surgery     Past Medical History  Diagnosis Date  . HYPERLIPIDEMIA 07/19/2007  . ANXIETY 07/19/2007  . DEPRESSION 07/19/2007  . VISUAL IMPAIRMENT 10/15/2007  . HYPERTENSION 07/19/2007  . HEMORRHOIDS, INTERNAL 12/01/2008  . Sleep apnea    BP 133/87 mmHg  Pulse 77  Resp 14  SpO2 95%  Opioid Risk Score:   Fall Risk Score:  `1  Depression screen PHQ 2/9  Depression screen [facility] 2/9 02/01/2016 03/03/2015  Decreased Interest - 1  Down, Depressed, Hopeless - 1  PHQ - 2 Score - 2  Altered sleeping 1 2  Tired, decreased energy 1 2  Change in appetite - 1  Feeling bad or failure about yourself  - 1  Trouble concentrating - 3  Moving slowly or fidgety/restless - 0  Suicidal thoughts - 0  PHQ-9 Score - 11     Review of Systems  Psychiatric/Behavioral: Positive for dysphoric mood. The patient is nervous/anxious.   All other systems reviewed and are negative.      Objective:   Physical Exam  Constitutional: He is oriented to person, place, and time. He appears well-developed and well-nourished.  HENT:  Head: Normocephalic and atraumatic.  Neck: Normal range of motion. Neck supple.  Cardiovascular: Normal rate and regular rhythm.   Pulmonary/Chest: Effort normal and breath sounds normal.  Musculoskeletal:  Normal Muscle Bulk and Muscle Testing Reveals: Upper Extremities: Full ROM and Muscle Strength 5/5 Lumbar Paraspinal Tenderness: L-1- L-2 Mainly Right  Side Lower Extremities: Full ROM and Muscle Strength 5/5 Left Ankle Brace Intact Arises from chair with ease Narrow Based Gait  Neurological: He is alert and oriented to person, place, and time.  Skin: Skin is warm and dry.  Psychiatric: He has a normal mood and affect.  Nursing note and vitals reviewed.         Assessment & Plan:  1. Chronic foot and ankle pain, left more than right: hx of multiple foot surgeries as a child due to ?devlopmental disorder. Continue with Daily stretches Refilled: Percocet 7.5/325 mg one tablet every 8 hours as needed #90.  We will continue the opioid monitoring program, this consists of regular clinic visits, examinations, urine drug screen, pill counts as well as use of [place] controlled substance reporting system. 2. Mild achilles tendonitis/right calcaneal spur: Follow up with Dr. Victorino DikeHewitt For left ankle/foot fusion. On hold due to financial hardship. 3. Morbid Obesity : Continue to Live Healthy Diet and Exercise regime 4. Anxiety disorder: Continue Xanax 5. Neuropathy: Continue Gabapentin   20minutes of face to face patient care time was spent during this visit. All questions were encouraged and answered . F/U in 1 month

## 2016-03-11 ENCOUNTER — Ambulatory Visit: Payer: BLUE CROSS/BLUE SHIELD | Admitting: Registered Nurse

## 2016-04-05 ENCOUNTER — Encounter
Payer: BLUE CROSS/BLUE SHIELD | Attending: Physical Medicine & Rehabilitation | Admitting: Physical Medicine & Rehabilitation

## 2016-04-05 ENCOUNTER — Encounter: Payer: Self-pay | Admitting: Physical Medicine & Rehabilitation

## 2016-04-05 VITALS — BP 134/86 | HR 78

## 2016-04-05 DIAGNOSIS — M19272 Secondary osteoarthritis, left ankle and foot: Secondary | ICD-10-CM

## 2016-04-05 DIAGNOSIS — G8929 Other chronic pain: Secondary | ICD-10-CM | POA: Insufficient documentation

## 2016-04-05 DIAGNOSIS — M25572 Pain in left ankle and joints of left foot: Secondary | ICD-10-CM | POA: Diagnosis not present

## 2016-04-05 DIAGNOSIS — G5603 Carpal tunnel syndrome, bilateral upper limbs: Secondary | ICD-10-CM

## 2016-04-05 DIAGNOSIS — Z5181 Encounter for therapeutic drug level monitoring: Secondary | ICD-10-CM | POA: Diagnosis not present

## 2016-04-05 DIAGNOSIS — M25571 Pain in right ankle and joints of right foot: Secondary | ICD-10-CM | POA: Diagnosis not present

## 2016-04-05 DIAGNOSIS — F419 Anxiety disorder, unspecified: Secondary | ICD-10-CM | POA: Insufficient documentation

## 2016-04-05 DIAGNOSIS — M79672 Pain in left foot: Secondary | ICD-10-CM | POA: Insufficient documentation

## 2016-04-05 DIAGNOSIS — M79671 Pain in right foot: Secondary | ICD-10-CM | POA: Diagnosis not present

## 2016-04-05 DIAGNOSIS — M1990 Unspecified osteoarthritis, unspecified site: Secondary | ICD-10-CM | POA: Diagnosis not present

## 2016-04-05 DIAGNOSIS — M19072 Primary osteoarthritis, left ankle and foot: Secondary | ICD-10-CM

## 2016-04-05 DIAGNOSIS — Z79899 Other long term (current) drug therapy: Secondary | ICD-10-CM

## 2016-04-05 DIAGNOSIS — M7660 Achilles tendinitis, unspecified leg: Secondary | ICD-10-CM | POA: Diagnosis not present

## 2016-04-05 MED ORDER — OXYCODONE-ACETAMINOPHEN 7.5-325 MG PO TABS: 1.0000 | ORAL_TABLET | Freq: Three times a day (TID) | ORAL | Status: DC | PRN

## 2016-04-05 MED ORDER — NAPROXEN 500 MG PO TABS: 500.0000 mg | ORAL_TABLET | Freq: Two times a day (BID) | ORAL | Status: DC

## 2016-04-05 NOTE — Progress Notes (Signed)
Subjective:    Patient ID: Brian Rowe, male    DOB: [birth year], 40 y.o.   MRN: [id number]  HPI   Mr. Waln is here in follow up of his chronic pain. He is still having superior ankle pain which is bothersome with prolonged weight bearing and whenever when he's out of his splint. His ankle tends to role to the outside. The more he rolls, the more his ankle tends to hurt. His ASO tends to help him the most but he doesn't like to wear it all of the time.   He is not actively taking an anti-inflammatory. He is using 7.5 percocet 3x daily.     Pain Inventory Average Pain 8 Pain Right Now 8 My pain is sharp, dull, stabbing and aching  In the last 24 hours, has pain interfered with the following? General activity 2 Relation with others 2 Enjoyment of life 2 What TIME of day is your pain at its worst? daytime Sleep (in general) Poor  Pain is worse with: walking, inactivity, standing and some activites Pain improves with: rest, medication and injections Relief from Meds: 4  Mobility walk without assistance how many minutes can you walk? 15 ability to climb steps?  no do you drive?  yes  Function I need assistance with the following:  household duties and shopping  Neuro/Psych depression anxiety  Prior Studies Any changes since last visit?  no  Physicians involved in your care Any changes since last visit?  no   Family History  Problem Relation Age of Onset  . Hypertension Father    Social History   Social History  . Marital Status: Single    Spouse Name: N/A  . Number of Children: N/A  . Years of Education: N/A   Social History Main Topics  . Smoking status: Former Smoker    Quit date: 12/06/2011  . Smokeless tobacco: None  . Alcohol Use: 0.6 oz/week    1 Cans of beer per week  . Drug Use: No  . Sexual Activity: Yes   Other Topics Concern  . None   Social History Narrative   Past Surgical History  Procedure Laterality Date  . Left ankle Left    . Throat surgery    . Nasal septum surgery     Past Medical History  Diagnosis Date  . HYPERLIPIDEMIA 07/19/2007  . ANXIETY 07/19/2007  . DEPRESSION 07/19/2007  . VISUAL IMPAIRMENT 10/15/2007  . HYPERTENSION 07/19/2007  . HEMORRHOIDS, INTERNAL 12/01/2008  . Sleep apnea    BP 134/86 mmHg  Pulse 78  SpO2 96%  Opioid Risk Score:   Fall Risk Score:  `1  Depression screen PHQ 2/9  Depression screen [facility] 2/9 02/01/2016 03/03/2015  Decreased Interest - 1  Down, Depressed, Hopeless - 1  PHQ - 2 Score - 2  Altered sleeping 1 2  Tired, decreased energy 1 2  Change in appetite - 1  Feeling bad or failure about yourself  - 1  Trouble concentrating - 3  Moving slowly or fidgety/restless - 0  Suicidal thoughts - 0  PHQ-9 Score - 11     Review of Systems     Objective:   Physical Exam  General: Alert and oriented x 3, No apparent distress. Obese large framed  HEENT: Head is normocephalic, atraumatic, PERRLA, EOMI, sclera anicteric, oral mucosa pink and moist, dentition intact, ext ear canals clear,  Neck: Supple without JVD or lymphadenopathy  Heart: Reg rate and rhythm. No murmurs rubs or  gallops  Chest: CTA bilaterally without wheezes, rales, or rhonchi; no distress  Abdomen: Soft, non-tender, non-distended, bowel sounds positive.  Extremities: No clubbing, cyanosis, or edema. Pulses are 2+  Skin: Clean and intact without signs of breakdown  Neuro: Pt is cognitively appropriate with normal insight, memory, and awareness. Cranial nerves 2-12 are intact. Sensory exam is normal---no obvious abnormalities in the left foot either. Reflexes are 2+ in all 4's. Fine motor coordination is intact. No tremors. Motor function is grossly 5/5 in all 4's with some pain inhibition weakness in the left foot. Mild sensory loss to LT on right calcaneus  Musculoskeletal: Full ROM, No pain with AROM or PROM in the neck, trunk, or upper extremities. he has a high arch on the left foot. He has pain with  ankle dorsiflexion, inv, evers. Mild pain over the achilles and calcaneus. Right heel tender along the posterior calcaneus and achilles insertion once again. Both achilles were tight but feet could be ranged to neutral. He's antalgic with gait more on the left than right. Varus deformity left ankle noted with significant inversion of the foot.Marland Kitchen. He tends to rest on the lateral side of his foot in supination  Psych: Pt's affect is appropriate. Pt is cooperative. Talkative but very pleasant    Assessment & Plan:   1. Chronic foot and ankle pain, left more than right---hx of multiple foot surgeries as a child due to ?devlopmental disorder. 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  2. Mild achilles tendonitis, ?right calcaneal spur  3. Morbid Obesity  4. Anxiety disorder    Plan:  1. We discussed the importance of weight loss once again. .  2. Recommend bilateral CTS splints if he's not intending on having surgery  3. Made a referral to [facility] for left ankle/foot orthotic to help control inversion  4. Continue percocet 7.5/325, one q8 prn #60  5. Initiate a trial of naproxen 500mg  bid with food.  6. Continue gabapentin for leg pain. 7.Follow up with me or NP in about 2 months.  20 minutes of face to face patient care time were spent during this visit. All questions were encouraged and answered.

## 2016-04-05 NOTE — Patient Instructions (Addendum)
YOU NEED TO PRIORITIZE YOUR SHOE WEAR.   WEIGHT LOSS WOULD ALSO HELP.    CONSIDER JOINT SUPPLEMENTS INCLUDING GLUCOSAMINE WITH CHONDROITIN, GARLIC, OMEGA 3 FATTY ACID

## 2016-04-10 LAB — TOXASSURE SELECT,+ANTIDEPR,UR

## 2016-04-15 ENCOUNTER — Telehealth: Payer: Self-pay

## 2016-04-15 NOTE — Telephone Encounter (Signed)
Please advise him that another discrepancy will mean cessation of narcotic rx'es here.

## 2016-04-15 NOTE — Telephone Encounter (Signed)
Left message for pt to return call.

## 2016-04-15 NOTE — Telephone Encounter (Signed)
Patient returning call to clinic.  Please call patient back.

## 2016-04-15 NOTE — Telephone Encounter (Signed)
Called and spoke with patient. I gave him the verbal warning. I told him if he had any more hydrocodone to bring them in so they can be destroyed and to not take any more. He thanked me for the call but did not offer any explanation.

## 2016-04-15 NOTE — Telephone Encounter (Signed)
Patient UDS was positive for Hydrocodone ( Discontinued in 10/2015). Patient had no presence of Oxycodone in UDS but reports last dosage taken on 04/04/2016. UDS was collected on 04/05/2016. Spoke with patient and he states that "I keep a jar when I run out of pills and possibly I took what was an Hydrocodone and not an Oxycodone.) Please advise.

## 2016-05-12 ENCOUNTER — Telehealth: Payer: Self-pay | Admitting: Family Medicine

## 2016-05-12 NOTE — Telephone Encounter (Signed)
Pt would like for you to call him back. He has a question about his chronic condition. [phone]

## 2016-05-24 ENCOUNTER — Other Ambulatory Visit (INDEPENDENT_AMBULATORY_CARE_PROVIDER_SITE_OTHER): Payer: BLUE CROSS/BLUE SHIELD

## 2016-05-24 DIAGNOSIS — E785 Hyperlipidemia, unspecified: Secondary | ICD-10-CM | POA: Diagnosis not present

## 2016-05-24 DIAGNOSIS — Z0000 Encounter for general adult medical examination without abnormal findings: Secondary | ICD-10-CM | POA: Diagnosis not present

## 2016-05-24 LAB — CBC WITH DIFFERENTIAL/PLATELET
Basophils Absolute: 0 10*3/uL (ref 0.0–0.1)
Basophils Relative: 0.6 % (ref 0.0–3.0)
Eosinophils Absolute: 0.3 10*3/uL (ref 0.0–0.7)
Eosinophils Relative: 4.6 % (ref 0.0–5.0)
HCT: 47.3 % (ref 39.0–52.0)
Hemoglobin: 16.4 g/dL (ref 13.0–17.0)
Lymphocytes Relative: 46.1 % — ABNORMAL HIGH (ref 12.0–46.0)
Lymphs Abs: 2.8 10*3/uL (ref 0.7–4.0)
MCHC: 34.7 g/dL (ref 30.0–36.0)
MCV: 92 fl (ref 78.0–100.0)
Monocytes Absolute: 0.5 10*3/uL (ref 0.1–1.0)
Monocytes Relative: 7.7 % (ref 3.0–12.0)
Neutro Abs: 2.5 10*3/uL (ref 1.4–7.7)
Neutrophils Relative %: 41 % — ABNORMAL LOW (ref 43.0–77.0)
Platelets: 192 10*3/uL (ref 150.0–400.0)
RBC: 5.14 Mil/uL (ref 4.22–5.81)
RDW: 13.3 % (ref 11.5–15.5)
WBC: 6.1 10*3/uL (ref 4.0–10.5)

## 2016-05-24 LAB — BASIC METABOLIC PANEL
BUN: 11 mg/dL (ref 6–23)
CO2: 27 mEq/L (ref 19–32)
Calcium: 9.1 mg/dL (ref 8.4–10.5)
Chloride: 102 mEq/L (ref 96–112)
Creatinine, Ser: 0.99 mg/dL (ref 0.40–1.50)
GFR: 89.68 mL/min (ref >60.00)
Glucose, Bld: 82 mg/dL (ref 70–99)
Potassium: 4 mEq/L (ref 3.5–5.1)
Sodium: 137 mEq/L (ref 135–145)

## 2016-05-24 LAB — HEPATIC FUNCTION PANEL
ALT: 41 U/L (ref 0–53)
AST: 18 U/L (ref 0–37)
Albumin: 3.9 g/dL (ref 3.5–5.2)
Alkaline Phosphatase: 53 U/L (ref 39–117)
Bilirubin, Direct: 0.1 mg/dL (ref 0.0–0.3)
Total Bilirubin: 0.5 mg/dL (ref 0.2–1.2)
Total Protein: 7.1 g/dL (ref 6.0–8.3)

## 2016-05-24 LAB — POC URINALSYSI DIPSTICK (AUTOMATED)
Bilirubin, UA: NEGATIVE
Blood, UA: NEGATIVE
Glucose, UA: NEGATIVE
Ketones, UA: NEGATIVE
Leukocytes, UA: NEGATIVE
Nitrite, UA: NEGATIVE
Protein, UA: NEGATIVE
Spec Grav, UA: >=1.03
Urobilinogen, UA: 0.2
pH, UA: 5.5

## 2016-05-24 LAB — LIPID PANEL
Cholesterol: 223 mg/dL — ABNORMAL HIGH (ref 0–200)
HDL: 29.7 mg/dL — ABNORMAL LOW (ref >39.00)
Total CHOL/HDL Ratio: 8
Triglycerides: 453 mg/dL — ABNORMAL HIGH (ref 0.0–149.0)

## 2016-05-24 LAB — LDL CHOLESTEROL, DIRECT: Direct LDL: 115 mg/dL

## 2016-05-24 LAB — TSH: TSH: 11.54 u[IU]/mL — ABNORMAL HIGH (ref 0.35–4.50)

## 2016-05-31 ENCOUNTER — Encounter: Payer: BLUE CROSS/BLUE SHIELD | Admitting: Internal Medicine

## 2016-06-06 ENCOUNTER — Telehealth: Payer: Self-pay | Admitting: Internal Medicine

## 2016-06-06 NOTE — Telephone Encounter (Signed)
FYI - please call if Dr. Kirtland BouchardK requests appointment

## 2016-06-06 NOTE — Telephone Encounter (Signed)
[facility] - Client TELEPHONE ADVICE RECORD [facility]  Patient Name: Brian Rowe  DOB: [birth year]    Initial Comment Caller states he saw his results online and he has questions about his results. He is having testicular pain right now.    Nurse Assessment  Nurse: Dorthula RuePatten, RN, Enrique SackKendra Date/Time (Eastern Time): 06/06/2016 4:03:40 PM  Confirm and document reason for call. If symptomatic, describe symptoms. You must click the next button to save text entered. ---Caller states he had lab work recently. He is not scheduled for physical until September. He states he is usually given antibiotics for prostatitis when he gets testicular pain. He states he is having difficulty sitting for long periods of time and dull soreness in his testicles. Denies any fever  Has the patient traveled out of the country within the last 30 days? ---Not Applicable  Does the patient have any new or worsening symptoms? ---Yes  Will a triage be completed? ---Yes  Related visit to physician within the last 2 weeks? ---No  Does the PT have any chronic conditions? (i.e. diabetes, asthma, etc.) ---Yes  List chronic conditions. ---Degenerative Arthritis,  Is this a behavioral health or substance abuse call? ---No     Guidelines    Guideline Title Affirmed Question Affirmed Notes  Scrotal Pain [1] Pain comes and goes (intermittent) AND [2] present > 24 hours    Final Disposition User   See Physician within 24 Hours LomaPatten, RN, News CorporationKendra    Comments  Caller did not want appointment at this time. States he will just follow up in September.   Referrals  REFERRED TO PCP OFFICE   Disagree/Comply: Disagree  Disagree/Comply Reason: Disagree with instructions

## 2016-06-10 MED ORDER — DOXYCYCLINE HYCLATE 100 MG PO TABS: 100.0000 mg | ORAL_TABLET | Freq: Two times a day (BID) | ORAL | Status: DC

## 2016-06-10 NOTE — Telephone Encounter (Signed)
Doxycycline 100 mg #20 one twice a day Please schedule office visit.  If unimproved

## 2016-06-10 NOTE — Telephone Encounter (Signed)
Spoke to pt, told him Dr.K would like you to start Doxycycline 100 mg twice a day x 10 days. If symptoms unimproved need to make an appt. Pt verbalized understanding.

## 2016-06-10 NOTE — Telephone Encounter (Signed)
Please see message and advise 

## 2016-06-10 NOTE — Telephone Encounter (Signed)
Received [facility] call - med wasn't sent in. Will send in per Dr Charm RingsK's recs.

## 2016-06-10 NOTE — Addendum Note (Signed)
Addended by: Eustaquio BoydenGUTIERREZ, Kortland Nichols on: 06/10/2016 07:23 PM   Modules accepted: Orders

## 2016-06-13 ENCOUNTER — Telehealth: Payer: Self-pay | Admitting: Physical Medicine & Rehabilitation

## 2016-06-13 ENCOUNTER — Telehealth: Payer: Self-pay | Admitting: *Deleted

## 2016-06-13 NOTE — Telephone Encounter (Signed)
Patient called to cancel his visit, then he left a message on the clinic line stating he needed his oxycodone refilled.  Please call patient at [phone].

## 2016-06-13 NOTE — Telephone Encounter (Signed)
FYI -- antibiotic sent in     PLEASE NOTE: All timestamps contained within this report are represented as [place] Standard Time. CONFIDENTIALTY NOTICE: This fax transmission is intended only for the addressee. It contains information that is legally privileged, confidential or otherwise protected from use or disclosure. If you are not the intended recipient, you are strictly prohibited from reviewing, disclosing, copying using or disseminating any of this information or taking any action in reliance on or regarding this information. If you have received this fax in error, please notify us immediately by telephone so that we can arrange for its return to us. Phone: [phone], Toll-Free: [phone], Fax: [phone] Page: 1 of 2 Call Id: [id number] [facility] - Client TELEPHONE ADVICE RECORD [facility] Patient Name: Brian Rowe Gender: Male DOB: [birth year] Age: 40 Y 6 M 17 D Return Phone Number: [phone] (Primary) Address: [place]Zip[place] [address] Client [facility] - Client Client Site [facility] - Night Physician Derryl HarborKwiatkowski, Pete - MD Contact Type Call Who Is Calling Patient / Member / Family / Caregiver Call Type Triage / Clinical Relationship To Patient Self Return Phone Number [phone] (Primary) Chief Complaint Pain - Generalized Reason for Call Symptomatic / Request for Health Information Initial Comment Caller states pharmacy doesn't have rx. States in pain and would like to get 10 day supply of medication. [facility] # [phone] PreDisposition Did not know what to do Translation No Nurse Assessment Nurse: Ladona RidgelGaddy, RN, Felicia Date/Time (Eastern Time): 06/10/2016 6:50:44 PM Confirm and document reason for call. If symptomatic, describe symptoms. You must click the next button to save text entered. ---PT says MD's nurse called him today. PT had blood  work done but he had to cancel MD appointment. [facility] [address]. He has been having prostatitis, genitals are sore and uncomfortable. Dull pain in testicles. He has had issues with this for years and has been to urologist and PCP for it in the past. (cipro has not helped) . No fever. Nurse called him this am. Freguent small urination Has the patient traveled out of the country within the last 30 days? ---No Does the patient have any new or worsening symptoms? ---Yes Will a triage be completed? ---Yes Related visit to physician within the last 2 weeks? ---No Does the PT have any chronic conditions? (i.e. diabetes, asthma, etc.) ---Yes List chronic conditions. ---just above Is this a behavioral health or substance abuse call? ---No Guidelines Guideline Title Affirmed Question Affirmed Notes Nurse Date/Time (Eastern Time) Scrotal Pain [1] Pain comes and goes (intermittent) AND [2] present > 24 hours Ladona RidgelGaddy, RN, [facility]icia 06/10/2016 6:58:39 PM PLEASE NOTE: All timestamps contained within this report are represented as [place] Standard Time. CONFIDENTIALTY NOTICE: This fax transmission is intended only for the addressee. It contains information that is legally privileged, confidential or otherwise protected from use or disclosure. If you are not the intended recipient, you are strictly prohibited from reviewing, disclosing, copying using or disseminating any of this information or taking any action in reliance on or regarding this information. If you have received this fax in error, please notify us immediately by telephone so that we can arrange for its return to us. Phone: [phone], Toll-Free: [phone], Fax: [phone] Page: 2 of 2 Call Id: [id number] Disp. Time Lamount Cohen(Eastern Time) Disposition Final User 06/10/2016 7:09:09 PM Paged On Call back to [facility] Gaddy, [place]RN, [facility]elicia 06/10/2016  7:22:14 PM Call Completed  Benedict Needy 06/10/2016 7:00:36 PM See Physician within 24 Hours Yes Gaddy, RN, Sunny Schlein Caller Understands: Yes Disagree/Comply: Comply Care Advice Given Per Guideline SEE PHYSICIAN WITHIN 24 HOURS: * Severe pain * Constant pain lasts over 1 hour * Swelling or redness occurs * Fever over 100.5 F (38.1 C) * You become worse. Comments User: Hampton Abbot, RN Date/Time (Eastern Time): 06/10/2016 7:03:19 PM urinating more than a few drops at a time - bladder is not full - but he feels a little is left in there - he wants MD called bc nurse told him she would order this. NKDA - he is on xanax, oxycodone, prozac, adderal, meloxicam User: Hampton Abbot, RN Date/Time Lamount Cohen Time): 06/10/2016 7:03:45 PM scrotum is not swollen or discolored User: Hampton Abbot, RN Date/Time (Eastern Time): 06/10/2016 7:05:30 PM he has diarrhea from anxiety of pain in scrotum - nauseated. Doesnt hurt when he is standing - moderate pain only when he sits User: Hampton Abbot, RN Date/Time (Eastern Time): 06/10/2016 7:07:11 PM now he says the diarrhea is not diarrhea - - not looser and not more frequent - declined advice for anxiety bc that is not changed from his baseline User: Hampton Abbot, RN Date/Time (Eastern Time): 06/10/2016 7:08:38 PM [phone] is [facility] [address] Referrals GO TO FACILITY REFUSED GO TO FACILITY REFUSED Paging DoctorName Phone DateTime Result/Outcome Message Type Notes Eustaquio Boyden - MD [phone] 06/10/2016 7:09:09 PM Paged On Call Back to Call Center Doctor Paged please call [place] [phone] @ Call Ctr Eustaquio Boyden - MD 06/10/2016 7:22:03 PM Spoke with On Call - General Message Result he can see MD's intention for ordering this medication - he will send in the medication

## 2016-06-13 NOTE — Telephone Encounter (Signed)
Lupita LeashDonna - please follow up on this request

## 2016-06-13 NOTE — Telephone Encounter (Signed)
I spoke with Mr Brian Rowe and informed him that he will have to have appt to get refill.  He is complaining about the facility fee.  I explained that we do not have control over that charge, and his only other option is to get primary care to prescribe, though it is doubtful with the scrutiny on opioids that he would be willing to take over.  Mr Brian Rowe's appt has been extended to 2 months and that is the most we can do with CII narcs.  He is going to schedule to be seen.

## 2016-06-13 NOTE — Telephone Encounter (Signed)
Spoke with patient informed him to contact Dr.Swartz for refill on oxycodone. Dr.K did not write this prescription. Patient verbalized understanding.

## 2016-06-14 ENCOUNTER — Encounter: Payer: BLUE CROSS/BLUE SHIELD | Admitting: Registered Nurse

## 2016-06-14 ENCOUNTER — Encounter: Payer: Self-pay | Admitting: Registered Nurse

## 2016-06-14 ENCOUNTER — Encounter: Payer: BLUE CROSS/BLUE SHIELD | Attending: Physical Medicine & Rehabilitation | Admitting: Registered Nurse

## 2016-06-14 VITALS — BP 118/84 | HR 80 | Resp 17

## 2016-06-14 DIAGNOSIS — F419 Anxiety disorder, unspecified: Secondary | ICD-10-CM | POA: Diagnosis not present

## 2016-06-14 DIAGNOSIS — M25572 Pain in left ankle and joints of left foot: Secondary | ICD-10-CM | POA: Insufficient documentation

## 2016-06-14 DIAGNOSIS — M79671 Pain in right foot: Secondary | ICD-10-CM | POA: Diagnosis not present

## 2016-06-14 DIAGNOSIS — G8929 Other chronic pain: Secondary | ICD-10-CM | POA: Insufficient documentation

## 2016-06-14 DIAGNOSIS — M7661 Achilles tendinitis, right leg: Secondary | ICD-10-CM | POA: Diagnosis not present

## 2016-06-14 DIAGNOSIS — M79672 Pain in left foot: Secondary | ICD-10-CM | POA: Insufficient documentation

## 2016-06-14 DIAGNOSIS — G894 Chronic pain syndrome: Secondary | ICD-10-CM

## 2016-06-14 DIAGNOSIS — M25571 Pain in right ankle and joints of right foot: Secondary | ICD-10-CM | POA: Insufficient documentation

## 2016-06-14 DIAGNOSIS — M7660 Achilles tendinitis, unspecified leg: Secondary | ICD-10-CM | POA: Diagnosis not present

## 2016-06-14 DIAGNOSIS — M19272 Secondary osteoarthritis, left ankle and foot: Secondary | ICD-10-CM | POA: Diagnosis not present

## 2016-06-14 DIAGNOSIS — M1990 Unspecified osteoarthritis, unspecified site: Secondary | ICD-10-CM | POA: Insufficient documentation

## 2016-06-14 DIAGNOSIS — Z5181 Encounter for therapeutic drug level monitoring: Secondary | ICD-10-CM

## 2016-06-14 DIAGNOSIS — Z79899 Other long term (current) drug therapy: Secondary | ICD-10-CM

## 2016-06-14 MED ORDER — OXYCODONE-ACETAMINOPHEN 7.5-325 MG PO TABS: 1.0000 | ORAL_TABLET | Freq: Three times a day (TID) | ORAL | Status: DC | PRN

## 2016-06-14 NOTE — Progress Notes (Signed)
Subjective:    Patient ID: Brian Rowe, male    DOB: November 13, 1976, 40 y.o.   MRN: [id number]  HPI:Brian Rowe is a 40 year old male who returns for follow up appointment and medication refill. He states his pain is located in his left ankle. He rates his pain 6. His current exercise regime is performing stretching exercises daily and walking short distances.  Dr. Riley Rowe sent a referral to [facility] for orthotic, Brian Rowe hasn't been able to go due to financial hardship.  Brian Rowe forgot his medication bottle, according to [facility] he picked up his Oxycodone 05/10/16. Instructed to bring his medication with him to every appointment. He verbalizes understanding.    Pain Inventory Average Pain 8 Pain Right Now 6 My pain is sharp, dull, stabbing and aching  In the last 24 hours, has pain interfered with the following? General activity 1 Relation with others 2 Enjoyment of life 1 What TIME of day is your pain at its worst? daytime Sleep (in general) NA  Pain is worse with: walking, inactivity, standing and some activites Pain improves with: rest, medication and injections Relief from Meds: NA  Mobility walk without assistance how many minutes can you walk? 10 ability to climb steps?  no do you drive?  yes  Function disabled: date disabled NA I need assistance with the following:  household duties and shopping  Neuro/Psych depression anxiety  Prior Studies Any changes since last visit?  no bone scan x-rays CT/MRI nerve study  Physicians involved in your care Primary care . Psychiatrist . Orthopedist .   Family History  Problem Relation Age of Onset  . Hypertension Father    Social History   Social History  . Marital Status: Single    Spouse Name: N/A  . Number of Children: N/A  . Years of Education: N/A   Social History Main Topics  . Smoking status: Former Smoker    Quit date: 12/06/2011  . Smokeless tobacco: None  . Alcohol Use: 0.6 oz/week    1 Cans of beer per week  . Drug Use: No  . Sexual Activity: Yes   Other Topics Concern  . None   Social History Narrative   Past Surgical History  Procedure Laterality Date  . Left ankle Left   . Throat surgery    . Nasal septum surgery     Past Medical History  Diagnosis Date  . HYPERLIPIDEMIA 07/19/2007  . ANXIETY 07/19/2007  . DEPRESSION 07/19/2007  . VISUAL IMPAIRMENT 10/15/2007  . HYPERTENSION 07/19/2007  . HEMORRHOIDS, INTERNAL 12/01/2008  . Sleep apnea    BP 118/84 mmHg  Pulse 80  Resp 17  SpO2 94%  Opioid Risk Score:   Fall Risk Score:  `1  Depression screen PHQ 2/9  Depression screen [facility] 2/9 02/01/2016 03/03/2015  Decreased Interest - 1  Down, Depressed, Hopeless - 1  PHQ - 2 Score - 2  Altered sleeping 1 2  Tired, decreased energy 1 2  Change in appetite - 1  Feeling bad or failure about yourself  - 1  Trouble concentrating - 3  Moving slowly or fidgety/restless - 0  Suicidal thoughts - 0  PHQ-9 Score - 11     Review of Systems  Psychiatric/Behavioral: Positive for dysphoric mood. The patient is nervous/anxious.   All other systems reviewed and are negative.      Objective:   Physical Exam  Constitutional: He is oriented to person, place, and time. He appears well-developed  and well-nourished.  HENT:  Head: Normocephalic and atraumatic.  Neck: Normal range of motion. Neck supple.  Cardiovascular: Normal rate and regular rhythm.   Pulmonary/Chest: Effort normal and breath sounds normal.  Musculoskeletal:  Normal Muscle Bulk and Muscle Testing Reveals: Upper Extremities: Full ROM and Muscle Strength 5/5 Lower Extremities: Full ROM and Muscle Strength 5/5 Left Ankle Brace Intact Arises from table with ease Narrow Based Gait  Neurological: He is alert and oriented to person, place, and time.  Skin: Skin is warm and dry.  Psychiatric: He has a normal mood and affect.  Nursing note and vitals reviewed.         Assessment & Plan:  1.  Chronic foot and ankle pain, left more than right: hx of multiple foot surgeries as a child due to ?devlopmental disorder. Continue with Daily stretches Refilled: Percocet 7.5/325 mg one tablet every 8 hours as needed #90. Second script given for the following month. We will continue the opioid monitoring program, this consists of regular clinic visits, examinations, urine drug screen, pill counts as well as use of [place] controlled substance reporting system. 2. Mild achilles tendonitis/right calcaneal spur: Follow up with Dr. Victorino Rowe For left ankle/foot fusion. On hold due to financial hardship. 3. Morbid Obesity : Continue to Live Healthy Diet and Exercise regime 4. Anxiety disorder: Continue Xanax 5. Neuropathy: Continue Gabapentin   20minutes of face to face patient care time was spent during this visit. All questions were encouraged and answered . F/U in 2 month

## 2016-06-23 ENCOUNTER — Telehealth: Payer: Self-pay | Admitting: Internal Medicine

## 2016-06-23 NOTE — Telephone Encounter (Signed)
SE NOTE: All timestamps contained within this report are represented as [place] Standard Time. CONFIDENTIALTY NOTICE: This fax transmission is intended only for the addressee. It contains information that is legally privileged, confidential or otherwise protected from use or disclosure. If you are not the intended recipient, you are strictly prohibited from reviewing, disclosing, copying using or disseminating any of this information or taking any action in reliance on or regarding this information. If you have received this fax in error, please notify us immediately by telephone so that we can arrange for its return to us. Phone: [phone], Toll-Free: [phone], Fax: [phone] Page: 1 of 1 Call Id: [id number] [facility] - Client TELEPHONE ADVICE RECORD [facility] Patient Name: Brian Rowe DOB: Aug 30, 1976 Initial Comment caller states his muscles are cramping Nurse Assessment Nurse: Debera Latalston, RN, Tinnie GensJeffrey Date/Time Lamount Cohen(Eastern Time): 06/23/2016 1:04:33 PM Confirm and document reason for call. If symptomatic, describe symptoms. You must click the next button to save text entered. ---Caller states his muscles are cramping. Body cramps. Has the patient traveled out of the country within the last 30 days? ---No Does the patient have any new or worsening symptoms? ---Yes Will a triage be completed? ---Yes Related visit to physician within the last 2 weeks? ---N/A Does the PT have any chronic conditions? (i.e. diabetes, asthma, etc.) ---Yes List chronic conditions. ---Chronic back pain, anxiety Is this a behavioral health or substance abuse call? ---No Guidelines Guideline Title Affirmed Question Affirmed Notes Heat Exposure (Heat Exhaustion and Heat Stroke) Normal muscle cramps or sore muscles from heat exposure Final Disposition User Home Care Debera Latalston, RN, Tinnie GensJeffrey Disagree/Comply: Comply

## 2016-08-12 ENCOUNTER — Encounter: Payer: BLUE CROSS/BLUE SHIELD | Attending: Physical Medicine & Rehabilitation | Admitting: Registered Nurse

## 2016-08-12 ENCOUNTER — Encounter: Payer: Self-pay | Admitting: Registered Nurse

## 2016-08-12 VITALS — BP 120/86 | HR 75 | Resp 14

## 2016-08-12 DIAGNOSIS — F419 Anxiety disorder, unspecified: Secondary | ICD-10-CM | POA: Insufficient documentation

## 2016-08-12 DIAGNOSIS — M25571 Pain in right ankle and joints of right foot: Secondary | ICD-10-CM | POA: Diagnosis not present

## 2016-08-12 DIAGNOSIS — M25572 Pain in left ankle and joints of left foot: Secondary | ICD-10-CM | POA: Insufficient documentation

## 2016-08-12 DIAGNOSIS — M79672 Pain in left foot: Secondary | ICD-10-CM | POA: Diagnosis not present

## 2016-08-12 DIAGNOSIS — M7660 Achilles tendinitis, unspecified leg: Secondary | ICD-10-CM | POA: Diagnosis not present

## 2016-08-12 DIAGNOSIS — Z5181 Encounter for therapeutic drug level monitoring: Secondary | ICD-10-CM

## 2016-08-12 DIAGNOSIS — G8929 Other chronic pain: Secondary | ICD-10-CM | POA: Insufficient documentation

## 2016-08-12 DIAGNOSIS — M79671 Pain in right foot: Secondary | ICD-10-CM | POA: Insufficient documentation

## 2016-08-12 DIAGNOSIS — Z79899 Other long term (current) drug therapy: Secondary | ICD-10-CM

## 2016-08-12 DIAGNOSIS — M19272 Secondary osteoarthritis, left ankle and foot: Secondary | ICD-10-CM

## 2016-08-12 DIAGNOSIS — M7661 Achilles tendinitis, right leg: Secondary | ICD-10-CM

## 2016-08-12 DIAGNOSIS — M1990 Unspecified osteoarthritis, unspecified site: Secondary | ICD-10-CM | POA: Insufficient documentation

## 2016-08-12 DIAGNOSIS — G894 Chronic pain syndrome: Secondary | ICD-10-CM

## 2016-08-12 MED ORDER — OXYCODONE-ACETAMINOPHEN 7.5-325 MG PO TABS: 1.0000 | ORAL_TABLET | Freq: Three times a day (TID) | ORAL | 0 refills | Status: DC | PRN

## 2016-08-12 NOTE — Addendum Note (Signed)
Addended by: Angela NevinWESSLING, Thermon Zulauf D on: 08/12/2016 01:29 PM   Modules accepted: Orders

## 2016-08-12 NOTE — Progress Notes (Signed)
Subjective:    Patient ID: Brian Rowe, male    DOB: Mar 16, 1976, 40 y.o.   MRN: [id number]  HPI: Brian Rowe is a 40 year old male who returns for follow up appointment and medication refill. He states his pain is located in his left ankle. He rates his pain 8. His current exercise regime is going swimming at the [facility] and performing stretching exercises daily and walking short distances.   Pain Inventory Average Pain 8 Pain Right Now 8 My pain is sharp, dull, stabbing and aching  In the last 24 hours, has pain interfered with the following? General activity 2 Relation with others 2 Enjoyment of life 1 What TIME of day is your pain at its worst? all Sleep (in general) Poor  Pain is worse with: walking, inactivity, standing and some activites Pain improves with: rest, medication and injections Relief from Meds: 6  Mobility walk without assistance how many minutes can you walk? 10 ability to climb steps?  no do you drive?  yes  Function disabled: date disabled .  Neuro/Psych depression anxiety  Prior Studies Any changes since last visit?  no  Physicians involved in your care Any changes since last visit?  no   Family History  Problem Relation Age of Onset  . Hypertension Father    Social History   Social History  . Marital status: Single    Spouse name: N/A  . Number of children: N/A  . Years of education: N/A   Social History Main Topics  . Smoking status: Former Smoker    Quit date: 12/06/2011  . Smokeless tobacco: Never Used  . Alcohol use 0.6 oz/week    1 Cans of beer per week  . Drug use: No  . Sexual activity: Yes   Other Topics Concern  . None   Social History Narrative  . None   Past Surgical History:  Procedure Laterality Date  . left ankle Left   . NASAL SEPTUM SURGERY    . THROAT SURGERY     Past Medical History:  Diagnosis Date  . ANXIETY 07/19/2007  . DEPRESSION 07/19/2007  . HEMORRHOIDS, INTERNAL 12/01/2008  .  HYPERLIPIDEMIA 07/19/2007  . HYPERTENSION 07/19/2007  . Sleep apnea   . VISUAL IMPAIRMENT 10/15/2007   BP 120/86 (BP Location: Right Arm, Patient Position: Sitting, Cuff Size: Large)   Pulse 75   Resp 14   SpO2 95%   Opioid Risk Score:   Fall Risk Score:  `1  Depression screen PHQ 2/9  Depression screen [facility] 2/9 02/01/2016 03/03/2015  Decreased Interest - 1  Down, Depressed, Hopeless - 1  PHQ - 2 Score - 2  Altered sleeping 1 2  Tired, decreased energy 1 2  Change in appetite - 1  Feeling bad or failure about yourself  - 1  Trouble concentrating - 3  Moving slowly or fidgety/restless - 0  Suicidal thoughts - 0  PHQ-9 Score - 11    Review of Systems  Constitutional: Negative.   HENT: Negative.   Eyes: Negative.   Respiratory: Negative.   Cardiovascular: Negative.   Gastrointestinal: Negative.   Endocrine: Negative.   Genitourinary: Negative.   Musculoskeletal: Positive for arthralgias.  Skin: Negative.   Allergic/Immunologic: Negative.   Neurological: Negative.   Hematological: Negative.   Psychiatric/Behavioral: Positive for dysphoric mood. The patient is nervous/anxious.   All other systems reviewed and are negative.      Objective:   Physical Exam  Constitutional: He is  oriented to person, place, and time. He appears well-developed and well-nourished.  HENT:  Head: Normocephalic and atraumatic.  Neck: Normal range of motion. Neck supple.  Cardiovascular: Normal rate and regular rhythm.   Pulmonary/Chest: Effort normal and breath sounds normal.  Musculoskeletal:  Normal Muscle Bulk and Muscle Testing Reveals: Upper Extremities: Full ROM and Muscle Strength 5/5 Lower Extremities: Full ROM and Muscle Strength 5/5 Left Lower Extremity Flexion Produces pain into Left Ankle Wearing Left Ankle Brace Arises from table with ease Narrow Based Gait  Neurological: He is oriented to person, place, and time.  Skin: Skin is warm and dry.  Psychiatric: He has a normal  mood and affect.  Nursing note and vitals reviewed.         Assessment & Plan:  1. Chronic foot and ankle pain, left more than right: hx of multiple foot surgeries as a child due to ?devlopmental disorder. Continue with Daily stretches Refilled: Percocet 7.5/325 mg one tablet every 8 hours as needed #90. Second script given for the following month. We will continue the opioid monitoring program, this consists of regular clinic visits, examinations, urine drug screen, pill counts as well as use of [place] controlled substance reporting system. 2. Mild achilles tendonitis/right calcaneal spur: Follow up with Dr. Victorino DikeHewitt For left ankle/foot fusion. On hold due to financial hardship. 3. Morbid Obesity : Continue to Live Healthy Diet and Exercise regime 4. Anxiety disorder: Continue Xanax 5. Neuropathy: Continue Gabapentin   20minutes of face to face patient care time was spent during this visit. All questions were encouraged and answered . F/U in 2 month

## 2016-08-12 NOTE — Progress Notes (Signed)
   Subjective:    Patient ID: Brian Rowe, male    DOB: [birth year], 40 y.o.   MRN: [id number]  HPI    Review of Systems     Objective:   Physical Exam        Assessment & Plan:

## 2016-08-19 LAB — TOXASSURE SELECT,+ANTIDEPR,UR

## 2016-08-24 NOTE — Progress Notes (Signed)
Urine drug screen for this encounter is consistent for prescribed medications.   

## 2016-08-29 ENCOUNTER — Encounter: Payer: Self-pay | Admitting: Internal Medicine

## 2016-08-29 ENCOUNTER — Ambulatory Visit (INDEPENDENT_AMBULATORY_CARE_PROVIDER_SITE_OTHER): Payer: BLUE CROSS/BLUE SHIELD | Admitting: Internal Medicine

## 2016-08-29 VITALS — BP 124/90 | HR 95 | Temp 98.4°F | Resp 20 | Ht 74.5 in | Wt 322.5 lb

## 2016-08-29 DIAGNOSIS — E039 Hypothyroidism, unspecified: Secondary | ICD-10-CM

## 2016-08-29 DIAGNOSIS — Z0000 Encounter for general adult medical examination without abnormal findings: Secondary | ICD-10-CM

## 2016-08-29 DIAGNOSIS — Z23 Encounter for immunization: Secondary | ICD-10-CM | POA: Diagnosis not present

## 2016-08-29 MED ORDER — LEVOTHYROXINE SODIUM 50 MCG PO TABS: 50.0000 ug | ORAL_TABLET | Freq: Every day | ORAL | 4 refills | Status: DC

## 2016-08-29 NOTE — Patient Instructions (Signed)
Limit your sodium (Salt) intake  Please check your blood pressure on a regular basis.  If it is consistently greater than 150/90, please make an office appointment.    It is important that you exercise regularly, at least 20 minutes 3 to 4 times per week.  If you develop chest pain or shortness of breath seek  medical attention.  You need to lose weight.  Consider a lower calorie diet and regular exercise.  Have your thyroid function studies checked in 6 weeks  Take your thyroid medication faithfully  Follow-up with Dr.  Evelene CroonKaur

## 2016-08-29 NOTE — Progress Notes (Signed)
Subjective:    Patient ID: Brian Rowe, male    DOB: 02-15-1976, 40 y.o.   MRN: [id number]  HPI  59 -year-old patient who is seen today for a preventive health examination.   He has been seen by urology  for chronic prostatitis.  He has morbid obesity, essential hypertension.  He has OSA and remains on CPAP.  He has had a UPPP. Patient states that he has not been taking his thyroid medication and a number of months.  Laboratory studies were reviewed from 3 months ago and did reveal an elevated TSH.  Compliance issues discussed  He is followed by psychiatry for generalized anxiety disorder as well as ADHD.  He states that he is considering applying for disability benefits due to GAD as well as chronic left ankle pain.  He is followed by orthopedics as well as chronic pain management.   Family history of father age 28 history of hypertension and recurrent DVTs Mother age 43.  History of obesity, OSA One brother in good health, a 27 One sister, age 40 with RA  Past Medical History:  Diagnosis Date  . ANXIETY 07/19/2007  . DEPRESSION 07/19/2007  . HEMORRHOIDS, INTERNAL 12/01/2008  . HYPERLIPIDEMIA 07/19/2007  . HYPERTENSION 07/19/2007  . Sleep apnea   . VISUAL IMPAIRMENT 10/15/2007    Social History   Social History  . Marital status: Single    Spouse name: N/A  . Number of children: N/A  . Years of education: N/A   Occupational History  . Not on file.   Social History Main Topics  . Smoking status: Former Smoker    Quit date: 12/06/2011  . Smokeless tobacco: Never Used  . Alcohol use 0.6 oz/week    1 Cans of beer per week  . Drug use: No  . Sexual activity: Yes   Other Topics Concern  . Not on file   Social History Narrative  . No narrative on file    Past Surgical History:  Procedure Laterality Date  . left ankle Left   . NASAL SEPTUM SURGERY    . THROAT SURGERY      Family History  Problem Relation Age of Onset  . Hypertension Father     Allergies    Allergen Reactions  . Tramadol     ?serotonin syndrome    Current Outpatient Prescriptions on File Prior to Visit  Medication Sig Dispense Refill  . alprazolam (XANAX) 2 MG tablet Take 2 mg by mouth 3 (three) times daily as needed for sleep. Prescribed by Dr.Kaur    . amphetamine-dextroamphetamine (ADDERALL) 30 MG tablet Take 30 mg by mouth 3 (three) times daily. Prescribed by Dr. Evelene Croon    . cyclobenzaprine (FLEXERIL) 10 MG tablet Take 5-10 mg by mouth 3 (three) times daily as needed for muscle spasms.     Marland Kitchen doxycycline (VIBRA-TABS) 100 MG tablet Take 1 tablet (100 mg total) by mouth 2 (two) times daily. 20 tablet 0  . FLUoxetine (PROZAC) 40 MG capsule TK ONE C PO QD  2  . gabapentin (NEURONTIN) 100 MG capsule Take 1 capsule (100 mg total) by mouth 3 (three) times daily. 90 capsule 3  . ibuprofen (ADVIL,MOTRIN) 800 MG tablet Take 1 tablet by mouth 3 (three) times daily as needed for mild pain. Reported on 02/01/2016 Prescribed by Dr. Riley Kill  0  . meloxicam (MOBIC) 15 MG tablet   5  . naproxen (NAPROSYN) 500 MG tablet Take 1 tablet (500 mg total) by mouth 2 (  two) times daily with a meal. 60 tablet 3  . oxyCODONE-acetaminophen (PERCOCET) 7.5-325 MG tablet Take 1 tablet by mouth every 8 (eight) hours as needed. (Patient taking differently: Take 1 tablet by mouth every 8 (eight) hours as needed. Prescribed by Dr. [name]) 90 tablet 0  . valACYclovir (VALTREX) 500 MG tablet Take 1 tablet (500 mg total) by mouth 2 (two) times daily. (Patient taking differently: Take 500 mg by mouth 2 (two) times daily as needed (Herpes). ) 20 tablet 1  . levothyroxine (SYNTHROID, LEVOTHROID) 50 MCG tablet Take 1 tablet (50 mcg total) by mouth daily before breakfast. (Patient not taking: Reported on 08/29/2016) 90 tablet 4   No current facility-administered medications on file prior to visit.     BP 124/90 (BP Location: Left Arm, Patient Position: Sitting, Cuff Size: Large)   Pulse 95   Temp 98.4 F  (36.9 C) (Oral)   Resp 20   Ht 6' 2.5" (1.892 m)   Wt (!) 322 lb 8 oz (146.3 kg)   SpO2 96%   BMI 40.85 kg/m       Review of Systems  Constitutional: Negative for activity change, appetite change, chills, fatigue and fever.  HENT: Negative for congestion, dental problem, ear pain, hearing loss, mouth sores, rhinorrhea, sinus pressure, sneezing, tinnitus, trouble swallowing and voice change.   Eyes: Negative for photophobia, pain, redness and visual disturbance.  Respiratory: Negative for apnea, cough, choking, chest tightness, shortness of breath and wheezing.   Cardiovascular: Negative for chest pain, palpitations and leg swelling.  Gastrointestinal: Negative for abdominal distention, abdominal pain, anal bleeding, blood in stool, constipation, diarrhea, nausea, rectal pain and vomiting.  Genitourinary: Negative for decreased urine volume, difficulty urinating, discharge, dysuria, flank pain, frequency, genital sores, hematuria, penile swelling, scrotal swelling, testicular pain and urgency.  Musculoskeletal: Positive for back pain. Negative for arthralgias, gait problem, joint swelling, myalgias, neck pain and neck stiffness.  Skin: Negative for color change, rash and wound.  Neurological: Negative for dizziness, tremors, seizures, syncope, facial asymmetry, speech difficulty, weakness, light-headedness, numbness and headaches.  Hematological: Negative for adenopathy. Does not bruise/bleed easily.  Psychiatric/Behavioral: Negative for agitation, behavioral problems, confusion, decreased concentration, dysphoric mood, hallucinations, self-injury, sleep disturbance and suicidal ideas. The patient is not nervous/anxious.        Objective:   Physical Exam  Constitutional: He appears well-developed and well-nourished.  Weight 324  HENT:  Head: Normocephalic and atraumatic.  Right Ear: External ear normal.  Left Ear: External ear normal.  Nose: Nose normal.  Mouth/Throat: Oropharynx  is clear and moist.  Eyes: Conjunctivae and EOM are normal. Pupils are equal, round, and reactive to light. No scleral icterus.  Neck: Normal range of motion. Neck supple. No JVD present. No thyromegaly present.  Cardiovascular: Regular rhythm, normal heart sounds and intact distal pulses.  Exam reveals no gallop and no friction rub.   No murmur heard. Pulmonary/Chest: Effort normal and breath sounds normal. He exhibits no tenderness.  Abdominal: Soft. Bowel sounds are normal. He exhibits no distension and no mass. There is no tenderness.  Genitourinary: Penis normal.  Musculoskeletal: Normal range of motion. He exhibits no edema or tenderness.  Lymphadenopathy:    He has no cervical adenopathy.  Neurological: He is alert. He has normal reflexes. No cranial nerve deficit. Coordination normal.  Skin: Skin is warm and dry. No rash noted.  Psychiatric: He has a normal mood and affect. His behavior is normal.  Assessment & Plan:   Preventive health examination Morbid obesity Hypothyroidism.  Hypertension, stable.   ADHD Generalized anxiety disorder.  Follow-up psychiatry Chronic left ankle pain.  Follow-up orthopedics  History chronic prostatitis.  Follow-up urology  Return here in one year or as needed  Recheck TSH in 6 weeks  KWIATKOWSKI,PETER Homero Fellers

## 2016-08-29 NOTE — Progress Notes (Signed)
Pre visit review using our clinic review tool, if applicable. No additional management support is needed unless otherwise documented below in the visit note. 

## 2016-10-06 ENCOUNTER — Encounter: Payer: Self-pay | Admitting: Registered Nurse

## 2016-10-06 ENCOUNTER — Encounter: Payer: BLUE CROSS/BLUE SHIELD | Attending: Physical Medicine & Rehabilitation | Admitting: Registered Nurse

## 2016-10-06 VITALS — BP 147/84 | HR 68 | Resp 14

## 2016-10-06 DIAGNOSIS — M79672 Pain in left foot: Secondary | ICD-10-CM | POA: Insufficient documentation

## 2016-10-06 DIAGNOSIS — Z5181 Encounter for therapeutic drug level monitoring: Secondary | ICD-10-CM

## 2016-10-06 DIAGNOSIS — G8929 Other chronic pain: Secondary | ICD-10-CM | POA: Insufficient documentation

## 2016-10-06 DIAGNOSIS — M7660 Achilles tendinitis, unspecified leg: Secondary | ICD-10-CM | POA: Diagnosis not present

## 2016-10-06 DIAGNOSIS — M19272 Secondary osteoarthritis, left ankle and foot: Secondary | ICD-10-CM

## 2016-10-06 DIAGNOSIS — G894 Chronic pain syndrome: Secondary | ICD-10-CM

## 2016-10-06 DIAGNOSIS — F419 Anxiety disorder, unspecified: Secondary | ICD-10-CM | POA: Diagnosis not present

## 2016-10-06 DIAGNOSIS — Z79899 Other long term (current) drug therapy: Secondary | ICD-10-CM

## 2016-10-06 DIAGNOSIS — M79671 Pain in right foot: Secondary | ICD-10-CM | POA: Diagnosis not present

## 2016-10-06 DIAGNOSIS — M25571 Pain in right ankle and joints of right foot: Secondary | ICD-10-CM | POA: Insufficient documentation

## 2016-10-06 DIAGNOSIS — M1990 Unspecified osteoarthritis, unspecified site: Secondary | ICD-10-CM | POA: Diagnosis not present

## 2016-10-06 DIAGNOSIS — M25572 Pain in left ankle and joints of left foot: Secondary | ICD-10-CM | POA: Diagnosis not present

## 2016-10-06 DIAGNOSIS — M7661 Achilles tendinitis, right leg: Secondary | ICD-10-CM | POA: Diagnosis not present

## 2016-10-06 MED ORDER — OXYCODONE-ACETAMINOPHEN 7.5-325 MG PO TABS: 1.0000 | ORAL_TABLET | Freq: Three times a day (TID) | ORAL | 0 refills | Status: DC | PRN

## 2016-10-06 NOTE — Progress Notes (Signed)
Subjective:    Patient ID: Brian Rowe, male    DOB: [birth year], 40 y.o.   MRN: [id number]  HPI: Brian Rowe is a 40 year old male who returns for follow up appointment and medication refill. He states his pain is located in his left ankle and right foot. He rates his pain 8. His current exercise regime is  performing stretching exercises daily and walking short distances.    Also states a few days ago while getting OOB , his left leg buckle and he scraped his head on the wall and landed on his knees. He was able to pick himself up, he didn't seek medical attention. Educated on falls prevention he verbalizes understanding.   Pain Inventory Average Pain 7 Pain Right Now 8 My pain is n/a  In the last 24 hours, has pain interfered with the following? General activity 2 Relation with others 2 Enjoyment of life 2 What TIME of day is your pain at its worst? daytime Sleep (in general) n/a  Pain is worse with: walking, inactivity, standing and some activites Pain improves with: rest, medication and injections Relief from Meds: n/a  Mobility use a cane  Function disabled: date disabled n/a I need assistance with the following:  meal prep, household duties and shopping Do you have any goals in this area?  yes  Neuro/Psych trouble walking depression anxiety  Prior Studies Any changes since last visit?  no  Physicians involved in your care Any changes since last visit?  no   Family History  Problem Relation Age of Onset  . Hypertension Father    Social History   Social History  . Marital status: Single    Spouse name: N/A  . Number of children: N/A  . Years of education: N/A   Social History Main Topics  . Smoking status: Former Smoker    Quit date: 12/06/2011  . Smokeless tobacco: Never Used  . Alcohol use 0.6 oz/week    1 Cans of beer per week  . Drug use: No  . Sexual activity: Yes   Other Topics Concern  . None   Social History Narrative  .  None   Past Surgical History:  Procedure Laterality Date  . left ankle Left   . NASAL SEPTUM SURGERY    . THROAT SURGERY     Past Medical History:  Diagnosis Date  . ANXIETY 07/19/2007  . DEPRESSION 07/19/2007  . HEMORRHOIDS, INTERNAL 12/01/2008  . HYPERLIPIDEMIA 07/19/2007  . HYPERTENSION 07/19/2007  . Sleep apnea   . VISUAL IMPAIRMENT 10/15/2007   BP (!) 147/84   Pulse 68   Resp 14   SpO2 96%   Opioid Risk Score:   Fall Risk Score:  `1  Depression screen PHQ 2/9  [facility] 08/29/2016 02/01/2016 03/03/2015  Decreased Interest 0 - 1  Down, Depressed, Hopeless 0 - 1  PHQ - 2 Score 0 - 2  Altered sleeping - 1 2  Tired, decreased energy - 1 2  Change in appetite - - 1  Feeling bad or failure about yourself  - - 1  Trouble concentrating - - 3  Moving slowly or fidgety/restless - - 0  Suicidal thoughts - - 0  PHQ-9 Score - - 11    Review of Systems  All other systems reviewed and are negative.      Objective:   Physical Exam  Constitutional: He is oriented to person, place, and time. He appears well-developed and well-nourished.  HENT:  Head: Normocephalic and atraumatic.  Neck: Normal range of motion. Neck supple.  Cardiovascular: Normal rate and regular rhythm.   Pulmonary/Chest: Effort normal and breath sounds normal.  Musculoskeletal:  Normal Muscle Bulk and Muscle Testing Reveals: Upper Extremities: Full ROM and Muscle Strength 5/5 Lower Extremities: Full ROM and Muscle Strength 5/5 Left lower extremity flexion produces pain into left ankle Arises from chair with ease Narrow based Gait  Neurological: He is alert and oriented to person, place, and time.  Skin: Skin is warm and dry.  Psychiatric: He has a normal mood and affect.  Nursing note and vitals reviewed.         Assessment & Plan:  1. Chronic foot and ankle pain, left more than right: hx of multiple foot surgeries as a child due to ?devlopmental disorder. Continue with Daily  stretches Refilled: Percocet 7.5/325 mg one tablet every 8 hours as needed #90. We will continue the opioid monitoring program, this consists of regular clinic visits, examinations, urine drug screen, pill counts as well as use of [place] controlled substance reporting [place]. 2. Mild achilles tendonitis/right calcaneal spur: Follow up with Dr. Victorino DikeHewitt For left ankle/foot fusion. On hold due to financial hardship. 3. Morbid Obesity : Continue to Live Healthy Diet and Exercise regime 4. Anxiety disorder: Continue Xanax 5. Neuropathy: Continue Gabapentin   20minutes of face to face patient care time was spent during this visit. All questions were encouraged and answered . F/U in 1 month

## 2016-10-19 ENCOUNTER — Other Ambulatory Visit: Payer: Self-pay | Admitting: Internal Medicine

## 2016-10-21 ENCOUNTER — Telehealth: Payer: Self-pay | Admitting: Physical Medicine & Rehabilitation

## 2016-10-21 NOTE — Telephone Encounter (Signed)
Patient would like to know if we could start prescribing Myeloic for him instead of his primary.  [name] please call patient.

## 2016-10-21 NOTE — Telephone Encounter (Signed)
It was meloxicam and Dr Amador CunasKwiatkowski has already refilled medication for him.

## 2016-11-01 ENCOUNTER — Ambulatory Visit: Payer: BLUE CROSS/BLUE SHIELD | Admitting: Physical Medicine & Rehabilitation

## 2016-11-07 ENCOUNTER — Encounter: Payer: Self-pay | Admitting: Physical Medicine & Rehabilitation

## 2016-11-07 ENCOUNTER — Encounter
Payer: BLUE CROSS/BLUE SHIELD | Attending: Physical Medicine & Rehabilitation | Admitting: Physical Medicine & Rehabilitation

## 2016-11-07 VITALS — BP 132/90 | HR 80 | Resp 14

## 2016-11-07 DIAGNOSIS — M1990 Unspecified osteoarthritis, unspecified site: Secondary | ICD-10-CM | POA: Insufficient documentation

## 2016-11-07 DIAGNOSIS — M19272 Secondary osteoarthritis, left ankle and foot: Secondary | ICD-10-CM

## 2016-11-07 DIAGNOSIS — M7660 Achilles tendinitis, unspecified leg: Secondary | ICD-10-CM | POA: Diagnosis not present

## 2016-11-07 DIAGNOSIS — F419 Anxiety disorder, unspecified: Secondary | ICD-10-CM | POA: Insufficient documentation

## 2016-11-07 DIAGNOSIS — M25572 Pain in left ankle and joints of left foot: Secondary | ICD-10-CM | POA: Diagnosis not present

## 2016-11-07 DIAGNOSIS — M7661 Achilles tendinitis, right leg: Secondary | ICD-10-CM

## 2016-11-07 DIAGNOSIS — M19072 Primary osteoarthritis, left ankle and foot: Secondary | ICD-10-CM

## 2016-11-07 DIAGNOSIS — M25571 Pain in right ankle and joints of right foot: Secondary | ICD-10-CM | POA: Diagnosis not present

## 2016-11-07 DIAGNOSIS — M79672 Pain in left foot: Secondary | ICD-10-CM | POA: Diagnosis not present

## 2016-11-07 DIAGNOSIS — G5603 Carpal tunnel syndrome, bilateral upper limbs: Secondary | ICD-10-CM

## 2016-11-07 DIAGNOSIS — G8929 Other chronic pain: Secondary | ICD-10-CM | POA: Insufficient documentation

## 2016-11-07 DIAGNOSIS — M79671 Pain in right foot: Secondary | ICD-10-CM | POA: Diagnosis not present

## 2016-11-07 MED ORDER — OXYCODONE-ACETAMINOPHEN 10-325 MG PO TABS: 1.0000 | ORAL_TABLET | Freq: Three times a day (TID) | ORAL | 0 refills | Status: DC | PRN

## 2016-11-07 NOTE — Progress Notes (Signed)
Subjective:    Patient ID: Brian Rowe, male    DOB: June 14, 1976, 40 y.o.   MRN: [id number]  HPI  Greig Castillandrew is here in follow up of his chronic foot and ankle pain. He feels that the medication helps, but he doesn't feel that the medication is helping quite as much as before. The right foot becomes agitated just from weight bearing and walking. He has a reasonable pair of shoes, but he tends to be intolerant of any prolonged standing or walking.   He is taking percocet 7.5mg  currently 3x daily. He is moving his bowels. He hasn't had any health changes.    Pain Inventory Average Pain 6 Pain Right Now 8 My pain is sharp, dull and aching  In the last 24 hours, has pain interfered with the following? General activity 3 Relation with others 2 Enjoyment of life 2 What TIME of day is your pain at its worst? daytime Sleep (in general) Poor  Pain is worse with: walking, inactivity, standing and some activites Pain improves with: rest, medication and injections Relief from Meds: 4  Mobility use a cane how many minutes can you walk? 15 ability to climb steps?  no do you drive?  yes Do you have any goals in this area?  yes  Function disabled: date disabled . I need assistance with the following:  shopping Do you have any goals in this area?  yes  Neuro/Psych depression anxiety  Prior Studies Any changes since last visit?  no  Physicians involved in your care Any changes since last visit?  no   Family History  Problem Relation Age of Onset  . Hypertension Father    Social History   Social History  . Marital status: Single    Spouse name: N/A  . Number of children: N/A  . Years of education: N/A   Social History Main Topics  . Smoking status: Former Smoker    Quit date: 12/06/2011  . Smokeless tobacco: Never Used  . Alcohol use 0.6 oz/week    1 Cans of beer per week  . Drug use: No  . Sexual activity: Yes   Other Topics Concern  . None   Social History  Narrative  . None   Past Surgical History:  Procedure Laterality Date  . left ankle Left   . NASAL SEPTUM SURGERY    . THROAT SURGERY     Past Medical History:  Diagnosis Date  . ANXIETY 07/19/2007  . DEPRESSION 07/19/2007  . HEMORRHOIDS, INTERNAL 12/01/2008  . HYPERLIPIDEMIA 07/19/2007  . HYPERTENSION 07/19/2007  . Sleep apnea   . VISUAL IMPAIRMENT 10/15/2007   BP 132/90 (BP Location: Right Arm, Patient Position: Sitting, Cuff Size: Large)   Pulse 80   Resp 14   SpO2 96%   Opioid Risk Score:   Fall Risk Score:  `1  Depression screen PHQ 2/9  [facility] 08/29/2016 02/01/2016 03/03/2015  Decreased Interest 0 - 1  Down, Depressed, Hopeless 0 - 1  PHQ - 2 Score 0 - 2  Altered sleeping - 1 2  Tired, decreased energy - 1 2  Change in appetite - - 1  Feeling bad or failure about yourself  - - 1  Trouble concentrating - - 3  Moving slowly or fidgety/restless - - 0  Suicidal thoughts - - 0  PHQ-9 Score - - 11    Review of Systems  Constitutional: Negative.   HENT: Negative.   Eyes: Negative.   Respiratory:  Negative.   Cardiovascular: Negative.   Gastrointestinal: Negative.   Endocrine: Negative.   Genitourinary: Negative.   Musculoskeletal: Negative.   Skin: Negative.   Allergic/Immunologic: Negative.   Neurological: Negative.   Hematological: Negative.   Psychiatric/Behavioral: Positive for dysphoric mood. The patient is nervous/anxious.   All other systems reviewed and are negative.      Objective:   Physical Exam  General: Alert and oriented x 3, No apparent distress. Obese large framed  HEENT: Head is normocephalic, atraumatic, PERRLA, EOMI, sclera anicteric, oral mucosa pink and moist, dentition intact, ext ear canals clear,  Neck: Supple without JVD or lymphadenopathy  Heart: Reg rate and rhythm. No murmurs rubs or gallops  Chest: CTA bilaterally without wheezes, rales, or rhonchi; no distress  Abdomen: Soft, non-tender, non-distended, bowel  sounds positive.  Extremities: pulses intact  Skin: Clean and intact without signs of breakdown  Neuro:  normal motor function. Decreased LT in hands. Motor limited by pain in feet.  Musculoskeletal: Full ROM, No pain with AROM or PROM in the neck, trunk, or upper extremities. he has a high arch on the left foot. He has pain with ankle dorsiflexion, inv, evers. Mild pain over the achilles and calcaneus.  Both achilles tight. Antalgia with weight bearing on left more than right. Varus deformity left ankle noted with significant inversion of the foot.Marland Kitchen. He tends to rest on the lateral side of his foot in supination  Psych: Pt's affect is appropriate. Pt is cooperative. Talkative but very pleasant    Assessment & Plan:   1. Chronic foot and ankle pain, left more than right---hx of multiple foot surgeries as a child due to ?devlopmental disorder. 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  2. Mild achilles tendonitis, ?right calcaneal spur  3. Morbid Obesity  4. Anxiety disorder    Plan:  1. Weight loss remains important. .  2. Recommend bilateral CTS splints. Surgery potentially next year. 3. Made a referral to [facility] for left ankle/foot orthotic to help control inversion  4. Increae percocet to 10/325, one q8 prn #90. Consider long acting 5. meloxicam daily 6. Continue gabapentin for leg pain. 7.Follow up with me or NP in about 2 months.  20 minutes of face to face patient care time were spent during this visit. All questions were encouraged and answered.

## 2016-11-07 NOTE — Patient Instructions (Addendum)
CONSIDER ROCKER BOTTOM SHOES WHILE USING AN ANKLE SLEEVE OR BRACE UNDERNEATH TO STABILIZE ANKLE          PLEASE CALL ME WITH ANY PROBLEMS OR QUESTIONS [phone])   HAPPY HOLIDAYS!!!!                    *                * *             *   *   *         *  *   *  *  *     *  *  *  *  *  *  * *  *  *  *  *  *  *  *  *  * *               *  *               *  *               *  *

## 2016-11-18 ENCOUNTER — Telehealth: Payer: Self-pay

## 2016-11-18 NOTE — Telephone Encounter (Signed)
We just increased his percocet at last visit. May have to consider long acting agent. Really needs to address weight loss as a longer term treatment of the pain

## 2016-11-18 NOTE — Telephone Encounter (Signed)
Patient called stating his pain has increased in his foot/ankle. Would like to know what can he do to alleviate his pain.  Please advise.

## 2016-11-18 NOTE — Telephone Encounter (Signed)
Patient has called once again stating his pain has increased. Patient has been advised to go over to Urgent care if the pain is intolerable per Jacalyn LefevreEunice Thomas. Patient stated his pain wasn't that bad but he wanted to let the providers know.

## 2016-11-22 NOTE — Telephone Encounter (Signed)
Patient informed od Dr. Rosalyn ChartersSwartz's advisement.  I also encouraged patient to call us to make next appointment

## 2017-01-10 ENCOUNTER — Encounter: Payer: Self-pay | Admitting: Registered Nurse

## 2017-01-10 ENCOUNTER — Other Ambulatory Visit: Payer: Self-pay | Admitting: Internal Medicine

## 2017-01-10 ENCOUNTER — Encounter: Payer: BLUE CROSS/BLUE SHIELD | Attending: Physical Medicine & Rehabilitation | Admitting: Registered Nurse

## 2017-01-10 VITALS — BP 127/86 | HR 73 | Resp 14

## 2017-01-10 DIAGNOSIS — M25572 Pain in left ankle and joints of left foot: Secondary | ICD-10-CM | POA: Diagnosis not present

## 2017-01-10 DIAGNOSIS — M7660 Achilles tendinitis, unspecified leg: Secondary | ICD-10-CM | POA: Insufficient documentation

## 2017-01-10 DIAGNOSIS — M1990 Unspecified osteoarthritis, unspecified site: Secondary | ICD-10-CM | POA: Insufficient documentation

## 2017-01-10 DIAGNOSIS — G894 Chronic pain syndrome: Secondary | ICD-10-CM

## 2017-01-10 DIAGNOSIS — M25571 Pain in right ankle and joints of right foot: Secondary | ICD-10-CM | POA: Diagnosis not present

## 2017-01-10 DIAGNOSIS — M19072 Primary osteoarthritis, left ankle and foot: Secondary | ICD-10-CM

## 2017-01-10 DIAGNOSIS — M19272 Secondary osteoarthritis, left ankle and foot: Secondary | ICD-10-CM | POA: Diagnosis not present

## 2017-01-10 DIAGNOSIS — Z79899 Other long term (current) drug therapy: Secondary | ICD-10-CM

## 2017-01-10 DIAGNOSIS — G8929 Other chronic pain: Secondary | ICD-10-CM | POA: Diagnosis present

## 2017-01-10 DIAGNOSIS — F419 Anxiety disorder, unspecified: Secondary | ICD-10-CM | POA: Insufficient documentation

## 2017-01-10 DIAGNOSIS — M79671 Pain in right foot: Secondary | ICD-10-CM | POA: Diagnosis not present

## 2017-01-10 DIAGNOSIS — M7661 Achilles tendinitis, right leg: Secondary | ICD-10-CM | POA: Diagnosis not present

## 2017-01-10 DIAGNOSIS — Z5181 Encounter for therapeutic drug level monitoring: Secondary | ICD-10-CM

## 2017-01-10 DIAGNOSIS — M79672 Pain in left foot: Secondary | ICD-10-CM | POA: Diagnosis not present

## 2017-01-10 MED ORDER — OXYCODONE-ACETAMINOPHEN 10-325 MG PO TABS: 1.0000 | ORAL_TABLET | Freq: Three times a day (TID) | ORAL | 0 refills | Status: DC | PRN

## 2017-01-10 NOTE — Progress Notes (Signed)
Subjective:    Patient ID: Brian Rowe, male    DOB: [birth year], 41 y.o.   MRN: [id number]  HPI: Brian Rowe is a 41 year old male who returns for follow up appointment and medication refill. He states his pain is located in his left ankle and right heel. He rates his pain 7. His current exercise regime is  walking short distances.   Pain Inventory Average Pain 8 Pain Right Now 7 My pain is sharp, dull, stabbing and aching  In the last 24 hours, has pain interfered with the following? General activity 3 Relation with others 2 Enjoyment of life 3 What TIME of day is your pain at its worst? daytime Sleep (in general) Poor  Pain is worse with: walking, inactivity, standing and some activites Pain improves with: rest, therapy/exercise, medication and injections Relief from Meds: 5  Mobility walk with assistance use a cane how many minutes can you walk? 15 ability to climb steps?  no do you drive?  yes Do you have any goals in this area?  yes  Function disabled: date disabled . I need assistance with the following:  household duties and shopping Do you have any goals in this area?  yes  Neuro/Psych No problems in this area  Prior Studies Any changes since last visit?  no  Physicians involved in your care Any changes since last visit?  no   Family History  Problem Relation Age of Onset  . Hypertension Father    Social History   Social History  . Marital status: Single    Spouse name: N/A  . Number of children: N/A  . Years of education: N/A   Social History Main Topics  . Smoking status: Former Smoker    Quit date: 12/06/2011  . Smokeless tobacco: Never Used  . Alcohol use 0.6 oz/week    1 Cans of beer per week  . Drug use: No  . Sexual activity: Yes   Other Topics Concern  . None   Social History Narrative  . None   Past Surgical History:  Procedure Laterality Date  . left ankle Left   . NASAL SEPTUM SURGERY    . THROAT SURGERY      Past Medical History:  Diagnosis Date  . ANXIETY 07/19/2007  . DEPRESSION 07/19/2007  . HEMORRHOIDS, INTERNAL 12/01/2008  . HYPERLIPIDEMIA 07/19/2007  . HYPERTENSION 07/19/2007  . Sleep apnea   . VISUAL IMPAIRMENT 10/15/2007   BP 127/86 (BP Location: Left Arm, Patient Position: Sitting, Cuff Size: Large)   Pulse 73   Resp 14   SpO2 95%   Opioid Risk Score:   Fall Risk Score:  `1  Depression screen PHQ 2/9  Depression screen [facility] 2/9 08/29/2016 02/01/2016 03/03/2015  Decreased Interest 0 - 1  Down, Depressed, Hopeless 0 - 1  PHQ - 2 Score 0 - 2  Altered sleeping - 1 2  Tired, decreased energy - 1 2  Change in appetite - - 1  Feeling bad or failure about yourself  - - 1  Trouble concentrating - - 3  Moving slowly or fidgety/restless - - 0  Suicidal thoughts - - 0  PHQ-9 Score - - 11    Review of Systems  Constitutional: Negative.   HENT: Negative.   Eyes: Negative.   Respiratory: Negative.   Cardiovascular: Negative.   Gastrointestinal: Negative.   Endocrine: Negative.   Genitourinary: Negative.   Musculoskeletal: Negative.   Skin: Negative.   Allergic/Immunologic: Negative.  Neurological: Negative.   Hematological: Negative.   Psychiatric/Behavioral: Negative.        Objective:   Physical Exam  Constitutional: He is oriented to person, place, and time. He appears well-developed and well-nourished.  HENT:  Head: Normocephalic and atraumatic.  Neck: Normal range of motion. Neck supple.  Cardiovascular: Normal rate and regular rhythm.   Pulmonary/Chest: Effort normal and breath sounds normal.  Musculoskeletal:  Normal Muscle Bulk and Muscle Testing Reveals: Upper Extremities: Full ROM and Muscle Strength 5/5 Lower Extremities: Full ROM and Muscle Strength 5/5 Arises from chair with ease, using straight cane for support Narrow Based Gait   Neurological: He is alert and oriented to person, place, and time.  Skin: Skin is warm and dry.  Psychiatric: He has a  normal mood and affect.  Nursing note and vitals reviewed.         Assessment & Plan:  1. Chronic foot and ankle pain, left more than right: hx of multiple foot surgeries as a child due to ?devlopmental disorder. Continue with Daily stretches. 01/10/2017 Refilled: Percocet 10/325 mg one tablet every 8 hours as needed #90. We will continue the opioid monitoring program, this consists of regular clinic visits, examinations, urine drug screen, pill counts as well as use of [place] controlled substance [place]. 2. Mild achilles tendonitis/right calcaneal spur: Follow up with Dr. Victorino DikeHewitt For left ankle/foot fusion. On hold due to financial hardship. 01/10/2017 3. Morbid Obesity : Continue to Live Healthy Diet and Exercise regime. 01/10/2017 4. Anxiety disorder: Continue Xanax. 01/10/2017 5. Neuropathy: Continue Gabapentin. 01/10/2017  20 minutes of face to face patient care time was spent during this visit. All questions were encouraged and answered.  F/U in 1 month

## 2017-01-10 NOTE — Addendum Note (Signed)
Addended by: Jerry CarasHAYNES, Cloris Flippo I on: 01/10/2017 02:09 PM   Modules accepted: Orders

## 2017-01-12 ENCOUNTER — Telehealth: Payer: Self-pay | Admitting: Registered Nurse

## 2017-01-12 NOTE — Telephone Encounter (Signed)
On 01/12/2017 the NCCSR was reviewed no conflict was seen on the [place] [place] [facility] with multiple prescribers. [name] has a signed narcotic contract with our office. If there were any discrepancies this would have been reported to his physician.

## 2017-01-16 LAB — TOXASSURE SELECT,+ANTIDEPR,UR

## 2017-01-20 ENCOUNTER — Telehealth: Payer: Self-pay | Admitting: Internal Medicine

## 2017-01-20 DIAGNOSIS — I159 Secondary hypertension, unspecified: Secondary | ICD-10-CM

## 2017-01-20 NOTE — Telephone Encounter (Signed)
okay

## 2017-01-20 NOTE — Telephone Encounter (Signed)
See message below, please advise.

## 2017-01-20 NOTE — Telephone Encounter (Signed)
° ° ° °  Pt call to ask since he taking meds he is asking if Dr Kirtland BouchardK will place a order for CMP

## 2017-01-23 NOTE — Telephone Encounter (Signed)
Left a detailed message on voicemail that stated, an orders for CMP lab draw. Pt will have to call office back and schedule a lab appoinment. If pt has any question call and ask for Paty,LPN

## 2017-01-23 NOTE — Progress Notes (Signed)
Urine drug screen for this encounter is consistent for prescribed medication 

## 2017-01-26 ENCOUNTER — Other Ambulatory Visit (INDEPENDENT_AMBULATORY_CARE_PROVIDER_SITE_OTHER): Payer: BLUE CROSS/BLUE SHIELD

## 2017-01-26 DIAGNOSIS — I159 Secondary hypertension, unspecified: Secondary | ICD-10-CM

## 2017-01-26 DIAGNOSIS — E039 Hypothyroidism, unspecified: Secondary | ICD-10-CM | POA: Diagnosis not present

## 2017-01-26 LAB — TSH: TSH: 7.2 u[IU]/mL — ABNORMAL HIGH (ref 0.35–4.50)

## 2017-01-27 LAB — COMPLETE METABOLIC PANEL WITH GFR
ALT: 34 U/L (ref 9–46)
AST: 21 U/L (ref 10–40)
Albumin: 4 g/dL (ref 3.6–5.1)
Alkaline Phosphatase: 53 U/L (ref 40–115)
BUN: 10 mg/dL (ref 7–25)
CO2: 27 mmol/L (ref 20–31)
Calcium: 8.9 mg/dL (ref 8.6–10.3)
Chloride: 103 mmol/L (ref 98–110)
Creat: 1.13 mg/dL (ref 0.60–1.35)
GFR, Est African American: >89 mL/min (ref >=60)
GFR, Est Non African American: 81 mL/min (ref >=60)
Glucose, Bld: 99 mg/dL (ref 65–99)
Potassium: 4.4 mmol/L (ref 3.5–5.3)
Sodium: 139 mmol/L (ref 135–146)
Total Bilirubin: 0.7 mg/dL (ref 0.2–1.2)
Total Protein: 6.8 g/dL (ref 6.1–8.1)

## 2017-01-31 ENCOUNTER — Telehealth: Payer: Self-pay | Admitting: Internal Medicine

## 2017-01-31 NOTE — Telephone Encounter (Signed)
See message below, please advise.

## 2017-01-31 NOTE — Telephone Encounter (Signed)
Pt would like results of CMP labs done last week, either call or mychart ok thanks!!

## 2017-02-01 ENCOUNTER — Telehealth: Payer: Self-pay | Admitting: Physical Medicine & Rehabilitation

## 2017-02-01 ENCOUNTER — Telehealth: Payer: Self-pay | Admitting: Internal Medicine

## 2017-02-01 NOTE — Telephone Encounter (Signed)
Laboratory studies are unremarkable except for abnormal TSH. Increase levothyroxine to 75 g daily  Repeat TSH in 6-8 weeks

## 2017-02-01 NOTE — Telephone Encounter (Signed)
Ptn complaint to patient experience - I called and explained split bill sites he said he knew (Vince had explained) he asked if we could send finanancial paperwork - I advised I would.  He said he "Just wanted his voice to be heard"

## 2017-02-01 NOTE — Telephone Encounter (Signed)
° ° ° °  Pt call to say he had lab a tsh and cmp and would like to have the results and would like a call back

## 2017-02-02 ENCOUNTER — Ambulatory Visit: Payer: BLUE CROSS/BLUE SHIELD | Admitting: Registered Nurse

## 2017-02-02 MED ORDER — LEVOTHYROXINE SODIUM 75 MCG PO TABS: 75.0000 ug | ORAL_TABLET | Freq: Every day | ORAL | 3 refills | Status: DC

## 2017-02-02 NOTE — Addendum Note (Signed)
Addended by: Neville RouteJAIMES, PATRICIA G on: 02/02/2017 01:37 PM   Modules accepted: Orders

## 2017-02-02 NOTE — Telephone Encounter (Signed)
Spoke with pt and made him aware of medication changes also that he needs a OV in 6-8wks. Pt verbalized understanding

## 2017-02-02 NOTE — Telephone Encounter (Signed)
New medication Rx was sent to pharmacy.

## 2017-02-02 NOTE — Telephone Encounter (Signed)
Spoke with pt and made him aware of medication changes and results also that he needs a OV in 6-8wks. Pt verbalized understanding

## 2017-02-07 ENCOUNTER — Encounter: Payer: BLUE CROSS/BLUE SHIELD | Attending: Physical Medicine & Rehabilitation | Admitting: Registered Nurse

## 2017-02-07 ENCOUNTER — Encounter: Payer: Self-pay | Admitting: Registered Nurse

## 2017-02-07 VITALS — BP 129/80 | HR 73

## 2017-02-07 DIAGNOSIS — M19072 Primary osteoarthritis, left ankle and foot: Secondary | ICD-10-CM | POA: Diagnosis not present

## 2017-02-07 DIAGNOSIS — M7660 Achilles tendinitis, unspecified leg: Secondary | ICD-10-CM | POA: Insufficient documentation

## 2017-02-07 DIAGNOSIS — M7661 Achilles tendinitis, right leg: Secondary | ICD-10-CM | POA: Diagnosis not present

## 2017-02-07 DIAGNOSIS — Z5181 Encounter for therapeutic drug level monitoring: Secondary | ICD-10-CM

## 2017-02-07 DIAGNOSIS — M79671 Pain in right foot: Secondary | ICD-10-CM | POA: Insufficient documentation

## 2017-02-07 DIAGNOSIS — M25572 Pain in left ankle and joints of left foot: Secondary | ICD-10-CM | POA: Diagnosis not present

## 2017-02-07 DIAGNOSIS — M19272 Secondary osteoarthritis, left ankle and foot: Secondary | ICD-10-CM

## 2017-02-07 DIAGNOSIS — Z79899 Other long term (current) drug therapy: Secondary | ICD-10-CM

## 2017-02-07 DIAGNOSIS — M1990 Unspecified osteoarthritis, unspecified site: Secondary | ICD-10-CM | POA: Diagnosis not present

## 2017-02-07 DIAGNOSIS — M79672 Pain in left foot: Secondary | ICD-10-CM | POA: Insufficient documentation

## 2017-02-07 DIAGNOSIS — G8929 Other chronic pain: Secondary | ICD-10-CM | POA: Diagnosis present

## 2017-02-07 DIAGNOSIS — M25571 Pain in right ankle and joints of right foot: Secondary | ICD-10-CM | POA: Diagnosis not present

## 2017-02-07 DIAGNOSIS — F419 Anxiety disorder, unspecified: Secondary | ICD-10-CM | POA: Diagnosis not present

## 2017-02-07 DIAGNOSIS — G894 Chronic pain syndrome: Secondary | ICD-10-CM

## 2017-02-07 MED ORDER — OXYCODONE-ACETAMINOPHEN 10-325 MG PO TABS: 1.0000 | ORAL_TABLET | Freq: Three times a day (TID) | ORAL | 0 refills | Status: DC | PRN

## 2017-02-07 NOTE — Progress Notes (Signed)
Subjective:    Patient ID: Brian Rowe, male    DOB: 1976-01-14, 41 y.o.   MRN: [id number]  HPI: [name] is a 41 year old male who returns for follow up appointment and medication refill. He states his pain is located in his left ankle and right heel. He rates his pain 9. His current exercise regime is  walking short distances and swimming twice a week.  Mr. Zangara forgot his medication, NCCSR was reviewed he picked up his oxycodone on 01/10/2017. Narcotic Contract reviewed, he verbalizes understanding. He is aware if this occurs again no prescription will be issued without his medication bottle, he verbalizes understanding.   Pain Inventory Average Pain 8 Pain Right Now 9 My pain is sharp, dull, stabbing and aching  In the last 24 hours, has pain interfered with the following? General activity 3 Relation with others 2 Enjoyment of life 2 What TIME of day is your pain at its worst? daytime Sleep (in general) Poor  Pain is worse with: walking, inactivity, standing and some activites Pain improves with: rest, therapy/exercise, medication and injections Relief from Meds: 4  Mobility walk with assistance use a cane how many minutes can you walk? 15 ability to climb steps?  no do you drive?  yes  Function disabled: date disabled . I need assistance with the following:  household duties and shopping Do you have any goals in this area?  yes  Neuro/Psych trouble walking depression anxiety  Prior Studies Any changes since last visit?  no  Physicians involved in your care Any changes since last visit?  no   Family History  Problem Relation Age of Onset  . Hypertension Father    Social History   Social History  . Marital status: Single    Spouse name: N/A  . Number of children: N/A  . Years of education: N/A   Social History Main Topics  . Smoking status: Former Smoker    Quit date: 12/06/2011  . Smokeless tobacco: Never Used  . Alcohol use 0.6  oz/week    1 Cans of beer per week  . Drug use: No  . Sexual activity: Yes   Other Topics Concern  . None   Social History Narrative  . None   Past Surgical History:  Procedure Laterality Date  . left ankle Left   . NASAL SEPTUM SURGERY    . THROAT SURGERY     Past Medical History:  Diagnosis Date  . ANXIETY 07/19/2007  . DEPRESSION 07/19/2007  . HEMORRHOIDS, INTERNAL 12/01/2008  . HYPERLIPIDEMIA 07/19/2007  . HYPERTENSION 07/19/2007  . Sleep apnea   . VISUAL IMPAIRMENT 10/15/2007   BP 129/80 (BP Location: Left Arm, Patient Position: Sitting, Cuff Size: Large)   Pulse 73   SpO2 96%   Opioid Risk Score:   Fall Risk Score:  `1  Depression screen PHQ 2/9  Depression screen [facility] 2/9 08/29/2016 02/01/2016 03/03/2015  Decreased Interest 0 - 1  Down, Depressed, Hopeless 0 - 1  PHQ - 2 Score 0 - 2  Altered sleeping - 1 2  Tired, decreased energy - 1 2  Change in appetite - - 1  Feeling bad or failure about yourself  - - 1  Trouble concentrating - - 3  Moving slowly or fidgety/restless - - 0  Suicidal thoughts - - 0  PHQ-9 Score - - 11    Review of Systems  Constitutional: Positive for appetite change and diaphoresis.  HENT: Negative.  Eyes: Negative.   Respiratory: Positive for apnea.   Cardiovascular: Negative.   Gastrointestinal: Positive for diarrhea.  Endocrine: Negative.   Genitourinary: Negative.   Musculoskeletal: Positive for arthralgias and gait problem.  Skin: Negative.   Allergic/Immunologic: Negative.   Hematological: Negative.   Psychiatric/Behavioral: Positive for dysphoric mood. The patient is nervous/anxious.        Objective:   Physical Exam  Constitutional: He is oriented to person, place, and time. He appears well-developed and well-nourished.  HENT:  Head: Normocephalic and atraumatic.  Neck: Normal range of motion. Neck supple.  Cardiovascular: Normal rate and regular rhythm.   Pulmonary/Chest: Effort normal and breath sounds normal.    Musculoskeletal:  Normal Muscle Bulk and Muscle Testing Reveals: Upper Extremities: Full ROM and Muscle Strength 5/5 Lumbar Paraspinal Tenderness: L-4-L-5 Lower Extremities: Full ROM and Muscle Strength 5/5 Arises from chair with ease using straight cane for support. Narrow based Gait  Neurological: He is alert and oriented to person, place, and time.  Skin: Skin is warm and dry.  Psychiatric: He has a normal mood and affect.  Nursing note and vitals reviewed.         Assessment & Plan:  1. Chronic foot and ankle pain, left more than right: hx of multiple foot surgeries as a child due to ?devlopmental disorder. Continue with Daily stretches. 02/07/2017 Refilled: Percocet 10/325 mg one tablet every 8 hours as needed #90. We will continue the opioid monitoring program, this consists of regular clinic visits, examinations, urine drug screen, pill counts as well as use of [place] [facility] [place] controlled substance reporting system. 2. Mild achilles tendonitis/right calcaneal spur: Follow up with Dr. Victorino DikeHewitt For left ankle/foot fusion. On hold due to financial hardship. 02/07/2017 3. Morbid Obesity : Continue to Live Healthy Diet and Exercise regime. 02/07/2017 4. Anxiety disorder: Continue Xanax. 02/07/2017 5. Neuropathy: Continue Gabapentin. 02/07/2017  20 minutes of face to face patient care time was spent during this visit. All questions were encouraged and answered.   F/U in 1 month

## 2017-02-07 NOTE — Progress Notes (Deleted)
   Subjective:    Patient ID: Brian Rowe, male    DOB: 1976/04/11, 41 y.o.   MRN: [id number]  HPI   Pain Inventory Average Pain {NUMBERS; 0-10:5044} Pain Right Now {NUMBERS; 0-10:5044} My pain is {PAIN DESCRIPTION:21022940}  In the last 24 hours, has pain interfered with the following? General activity {NUMBERS; 0-10:5044} Relation with others {NUMBERS; 0-10:5044} Enjoyment of life {NUMBERS; 0-10:5044} What TIME of day is your pain at its worst? {TIME OF WUJ:81191478}AY:21022941} Sleep (in general) {BHH GOOD/FAIR/POOR:22877}  Pain is worse with: {ACTIVITIES:21022942} Pain improves with: {PAIN IMPROVES GNFA:21308657}WITH:21022943} Relief from Meds: {NUMBERS; 0-10:5044}  Mobility {MOBILITY QIO:96295284}ADL:21022944}  Function {FUNCTION:21022946}  Neuro/Psych {NEURO/PSYCH:21022948}  Prior Studies {CPRM PRIOR STUDIES:21022953}  Physicians involved in your care {CPRM PHYSICIANS INVOLVED IN YOUR CARE:21022954}   Family History  Problem Relation Age of Onset  . Hypertension Father    Social History   Social History  . Marital status: Single    Spouse name: N/A  . Number of children: N/A  . Years of education: N/A   Social History Main Topics  . Smoking status: Former Smoker    Quit date: 12/06/2011  . Smokeless tobacco: Never Used  . Alcohol use 0.6 oz/week    1 Cans of beer per week  . Drug use: No  . Sexual activity: Yes   Other Topics Concern  . None   Social History Narrative  . None   Past Surgical History:  Procedure Laterality Date  . left ankle Left   . NASAL SEPTUM SURGERY    . THROAT SURGERY     Past Medical History:  Diagnosis Date  . ANXIETY 07/19/2007  . DEPRESSION 07/19/2007  . HEMORRHOIDS, INTERNAL 12/01/2008  . HYPERLIPIDEMIA 07/19/2007  . HYPERTENSION 07/19/2007  . Sleep apnea   . VISUAL IMPAIRMENT 10/15/2007   BP 129/80 (BP Location: Left Arm, Patient Position: Sitting, Cuff Size: Large)   Pulse 73   SpO2 96%   Opioid Risk Score:   Fall Risk Score:   `1  Depression screen PHQ 2/9  [facility] 2/9 08/29/2016 02/01/2016 03/03/2015  Decreased Interest 0 - 1  Down, Depressed, Hopeless 0 - 1  PHQ - 2 Score 0 - 2  Altered sleeping - 1 2  Tired, decreased energy - 1 2  Change in appetite - - 1  Feeling bad or failure about yourself  - - 1  Trouble concentrating - - 3  Moving slowly or fidgety/restless - - 0  Suicidal thoughts - - 0  PHQ-9 Score - - 11    Review of Systems     Objective:   Physical Exam        Assessment & Plan:

## 2017-03-07 ENCOUNTER — Encounter: Payer: BLUE CROSS/BLUE SHIELD | Attending: Physical Medicine & Rehabilitation | Admitting: Registered Nurse

## 2017-03-07 ENCOUNTER — Encounter: Payer: Self-pay | Admitting: Registered Nurse

## 2017-03-07 VITALS — BP 117/82 | HR 85

## 2017-03-07 DIAGNOSIS — G8929 Other chronic pain: Secondary | ICD-10-CM | POA: Diagnosis present

## 2017-03-07 DIAGNOSIS — M25571 Pain in right ankle and joints of right foot: Secondary | ICD-10-CM | POA: Insufficient documentation

## 2017-03-07 DIAGNOSIS — M7660 Achilles tendinitis, unspecified leg: Secondary | ICD-10-CM | POA: Diagnosis not present

## 2017-03-07 DIAGNOSIS — M79672 Pain in left foot: Secondary | ICD-10-CM | POA: Insufficient documentation

## 2017-03-07 DIAGNOSIS — M19072 Primary osteoarthritis, left ankle and foot: Secondary | ICD-10-CM

## 2017-03-07 DIAGNOSIS — Z79899 Other long term (current) drug therapy: Secondary | ICD-10-CM

## 2017-03-07 DIAGNOSIS — M79671 Pain in right foot: Secondary | ICD-10-CM | POA: Diagnosis not present

## 2017-03-07 DIAGNOSIS — M19272 Secondary osteoarthritis, left ankle and foot: Secondary | ICD-10-CM

## 2017-03-07 DIAGNOSIS — M7661 Achilles tendinitis, right leg: Secondary | ICD-10-CM | POA: Diagnosis not present

## 2017-03-07 DIAGNOSIS — M1990 Unspecified osteoarthritis, unspecified site: Secondary | ICD-10-CM | POA: Insufficient documentation

## 2017-03-07 DIAGNOSIS — M25572 Pain in left ankle and joints of left foot: Secondary | ICD-10-CM | POA: Insufficient documentation

## 2017-03-07 DIAGNOSIS — Z5181 Encounter for therapeutic drug level monitoring: Secondary | ICD-10-CM

## 2017-03-07 DIAGNOSIS — G894 Chronic pain syndrome: Secondary | ICD-10-CM

## 2017-03-07 DIAGNOSIS — F419 Anxiety disorder, unspecified: Secondary | ICD-10-CM | POA: Diagnosis not present

## 2017-03-07 MED ORDER — OXYCODONE-ACETAMINOPHEN 10-325 MG PO TABS: 1.0000 | ORAL_TABLET | Freq: Three times a day (TID) | ORAL | 0 refills | Status: DC | PRN

## 2017-03-07 NOTE — Progress Notes (Signed)
Subjective:    Patient ID: Brian Rowe, male    DOB: 1976-05-26, 40 y.o.   MRN: [id number]  HPI: [name] is a 41 year old male who returns for follow up appointment and medication refill. He states his pain is located in his left ankle and right heel. He rates his pain 7. His current exercise regime is  walking short distances using cane for support.  Pain Inventory Average Pain 8 Pain Right Now 7 My pain is sharp, dull, stabbing, tingling and aching  In the last 24 hours, has pain interfered with the following? General activity 3 Relation with others 2 Enjoyment of life 2 What TIME of day is your pain at its worst? . Sleep (in general) .  Pain is worse with: walking, inactivity, standing and some activites Pain improves with: rest Relief from Meds: .  Mobility use a cane ability to climb steps?  no do you drive?  yes  Function disabled: date disabled 2013 I need assistance with the following:  household duties and shopping  Neuro/Psych numbness tingling depression anxiety  Prior Studies Any changes since last visit?  no  Physicians involved in your care Any changes since last visit?  no   Family History  Problem Relation Age of Onset  . Hypertension Father    Social History   Social History  . Marital status: Single    Spouse name: N/A  . Number of children: N/A  . Years of education: N/A   Social History Main Topics  . Smoking status: Former Smoker    Quit date: 12/06/2011  . Smokeless tobacco: Never Used  . Alcohol use 0.6 oz/week    1 Cans of beer per week  . Drug use: No  . Sexual activity: Yes   Other Topics Concern  . Not on file   Social History Narrative  . No narrative on file   Past Surgical History:  Procedure Laterality Date  . left ankle Left   . NASAL SEPTUM SURGERY    . THROAT SURGERY     Past Medical History:  Diagnosis Date  . ANXIETY 07/19/2007  . DEPRESSION 07/19/2007  . HEMORRHOIDS, INTERNAL  12/01/2008  . HYPERLIPIDEMIA 07/19/2007  . HYPERTENSION 07/19/2007  . Sleep apnea   . VISUAL IMPAIRMENT 10/15/2007   There were no vitals taken for this visit.  Opioid Risk Score:   Fall Risk Score:  `1  Depression screen PHQ 2/9  Depression screen [facility] 2/9 08/29/2016 02/01/2016 03/03/2015  Decreased Interest 0 - 1  Down, Depressed, Hopeless 0 - 1  PHQ - 2 Score 0 - 2  Altered sleeping - 1 2  Tired, decreased energy - 1 2  Change in appetite - - 1  Feeling bad or failure about yourself  - - 1  Trouble concentrating - - 3  Moving slowly or fidgety/restless - - 0  Suicidal thoughts - - 0  PHQ-9 Score - - 11    Review of Systems  Constitutional: Negative.   HENT: Negative.   Eyes: Negative.   Respiratory: Negative.   Cardiovascular: Negative.   Gastrointestinal: Negative.   Endocrine: Negative.   Genitourinary: Negative.   Musculoskeletal: Negative.   Skin: Negative.   Allergic/Immunologic: Negative.   Neurological: Negative.   Hematological: Negative.   Psychiatric/Behavioral: Negative.   All other systems reviewed and are negative.      Objective:   Physical Exam  Constitutional: He is oriented to person, place, and time. He appears  well-developed and well-nourished.  HENT:  Head: Normocephalic and atraumatic.  Neck: Normal range of motion. Neck supple.  Cardiovascular: Normal rate and regular rhythm.   Pulmonary/Chest: Effort normal and breath sounds normal.  Musculoskeletal:  Normal Muscle Bulk and Muscle Testing Reveals: Upper Extremities: Full ROM and Muscle Strength 5/5 Lower Extremities: Full ROM and Muscle Strength 5/5 Wearing Left Ankle Brace Arises from Table Slowly using straight cane for support Antalgic Gait  Neurological: He is alert and oriented to person, place, and time.  Skin: Skin is warm and dry.  Psychiatric: He has a normal mood and affect.  Nursing note and vitals reviewed.         Assessment & Plan:  1. Chronic foot and ankle  pain, left more than right: hx of multiple foot surgeries as a child due to ?devlopmental disorder. Continue with Daily stretches. 03/07/2017 Refilled: Percocet 10/325 mg one tablet every 8 hours as needed #90. We will continue the opioid monitoring program, this consists of regular clinic visits, examinations, urine drug screen, pill counts as well as use of [place] controlled substance reporting system. 2. Mild achilles tendonitis/right calcaneal spur: Follow up with Dr. Victorino Dike For left ankle/foot fusion. On hold due to financial hardship. 03/07/2017 3. Morbid Obesity : Continue to Live Healthy Diet and Exercise regime. 03/07/2017 4. Anxiety disorder: Continue Xanax. 03/07/2017 5. Neuropathy: Continue Gabapentin. 03/07/2017  20 minutes of face to face patient care time was spent during this visit. All questions were encouraged and answered.   F/U in 1 month

## 2017-03-20 ENCOUNTER — Telehealth: Payer: Self-pay | Admitting: *Deleted

## 2017-03-20 NOTE — Telephone Encounter (Signed)
Brian Rowe is calling about his CD of his MRI he left for DR Riley Kill to review to see if he could write a letter to assist in his pursuit of getting disability.  He is asking about if this has been reviewed.

## 2017-03-22 NOTE — Telephone Encounter (Signed)
I am more than happy to write him a letter, but who am I writing it to? I am not going to write an "open" letter to disability. However, if he has a Clinical research associate who needs specific information, or someone has some specific questions I can answer, then we can proceed. What happens with the "open letter" is that whoever receives this, wants something different or in addition to what I have already put in writing.

## 2017-03-24 ENCOUNTER — Encounter: Payer: Self-pay | Admitting: Registered Nurse

## 2017-03-24 ENCOUNTER — Encounter: Payer: Self-pay | Admitting: Physical Medicine & Rehabilitation

## 2017-03-24 NOTE — Telephone Encounter (Signed)
Just to double up with yall on my lawyer information it is:    Librarian, academic Team   [employer]  [address]  [place], [place] [address]  Fax: [phone]  Email: [email or web address]    Thank You and see you soon,  Wylie Hail

## 2017-03-24 NOTE — Telephone Encounter (Signed)
That's all well and good, but i'm not writing anything until I have clear direction what is exactly needed.  His lawyer will need to contact us if they want to proceed.

## 2017-04-04 ENCOUNTER — Telehealth: Payer: Self-pay | Admitting: Registered Nurse

## 2017-04-04 ENCOUNTER — Encounter: Payer: BLUE CROSS/BLUE SHIELD | Attending: Physical Medicine & Rehabilitation | Admitting: Registered Nurse

## 2017-04-04 ENCOUNTER — Encounter: Payer: Self-pay | Admitting: Registered Nurse

## 2017-04-04 VITALS — BP 122/83 | HR 83

## 2017-04-04 DIAGNOSIS — M7661 Achilles tendinitis, right leg: Secondary | ICD-10-CM | POA: Diagnosis not present

## 2017-04-04 DIAGNOSIS — Z5181 Encounter for therapeutic drug level monitoring: Secondary | ICD-10-CM

## 2017-04-04 DIAGNOSIS — M1990 Unspecified osteoarthritis, unspecified site: Secondary | ICD-10-CM | POA: Diagnosis not present

## 2017-04-04 DIAGNOSIS — M19272 Secondary osteoarthritis, left ankle and foot: Secondary | ICD-10-CM | POA: Diagnosis not present

## 2017-04-04 DIAGNOSIS — M79672 Pain in left foot: Secondary | ICD-10-CM | POA: Insufficient documentation

## 2017-04-04 DIAGNOSIS — G894 Chronic pain syndrome: Secondary | ICD-10-CM | POA: Diagnosis not present

## 2017-04-04 DIAGNOSIS — M25572 Pain in left ankle and joints of left foot: Secondary | ICD-10-CM | POA: Insufficient documentation

## 2017-04-04 DIAGNOSIS — M7660 Achilles tendinitis, unspecified leg: Secondary | ICD-10-CM | POA: Diagnosis not present

## 2017-04-04 DIAGNOSIS — G8929 Other chronic pain: Secondary | ICD-10-CM | POA: Insufficient documentation

## 2017-04-04 DIAGNOSIS — M79671 Pain in right foot: Secondary | ICD-10-CM | POA: Insufficient documentation

## 2017-04-04 DIAGNOSIS — F419 Anxiety disorder, unspecified: Secondary | ICD-10-CM | POA: Insufficient documentation

## 2017-04-04 DIAGNOSIS — M19072 Primary osteoarthritis, left ankle and foot: Secondary | ICD-10-CM

## 2017-04-04 DIAGNOSIS — M25571 Pain in right ankle and joints of right foot: Secondary | ICD-10-CM | POA: Diagnosis not present

## 2017-04-04 DIAGNOSIS — Z79899 Other long term (current) drug therapy: Secondary | ICD-10-CM | POA: Diagnosis not present

## 2017-04-04 MED ORDER — OXYCODONE-ACETAMINOPHEN 10-325 MG PO TABS: 1.0000 | ORAL_TABLET | Freq: Three times a day (TID) | ORAL | 0 refills | Status: DC | PRN

## 2017-04-04 NOTE — Progress Notes (Signed)
Subjective:    Patient ID: Brian Rowe, male    DOB: 1976-09-20, 41 y.o.   MRN: [id number]  HPI: [name] is a 41year old male who returns for follow up appointment and medication refill. He states his pain is located in his left ankle and right heel. He rates his pain 8. His current exercise regime is walking short distances using cane for support.  His last UDS was performed on 01/10/2017, it was inconsistent for Oxycodone. Last Oxycodone was picked up on 12/04/16, which makes the UDS consistent.   Pain Inventory Average Pain 8 Pain Right Now 9 My pain is sharp, burning, dull, stabbing and aching  In the last 24 hours, has pain interfered with the following? General activity 2 Relation with others 2 Enjoyment of life 3 What TIME of day is your pain at its worst? daytime Sleep (in general) NA  Pain is worse with: walking, sitting, inactivity, standing and some activites Pain improves with: rest, medication and injections Relief from Meds: 6  Mobility use a cane how many minutes can you walk? 10 ability to climb steps?  no do you drive?  yes  Function disabled: date disabled .  Neuro/Psych numbness depression anxiety  Prior Studies Any changes since last visit?  no  Physicians involved in your care Any changes since last visit?  no   Family History  Problem Relation Age of Onset  . Hypertension Father    Social History   Social History  . Marital status: Single    Spouse name: N/A  . Number of children: N/A  . Years of education: N/A   Social History Main Topics  . Smoking status: Former Smoker    Quit date: 12/06/2011  . Smokeless tobacco: Never Used  . Alcohol use 0.6 oz/week    1 Cans of beer per week  . Drug use: No  . Sexual activity: Yes   Other Topics Concern  . None   Social History Narrative  . None   Past Surgical History:  Procedure Laterality Date  . left ankle Left   . NASAL SEPTUM SURGERY    . THROAT SURGERY      Past Medical History:  Diagnosis Date  . ANXIETY 07/19/2007  . DEPRESSION 07/19/2007  . HEMORRHOIDS, INTERNAL 12/01/2008  . HYPERLIPIDEMIA 07/19/2007  . HYPERTENSION 07/19/2007  . Sleep apnea   . VISUAL IMPAIRMENT 10/15/2007   BP 122/83   Pulse 83   SpO2 97%   Opioid Risk Score:   Fall Risk Score:  `1  Depression screen PHQ 2/9  Depression screen [facility] 2/9 04/04/2017 08/29/2016 02/01/2016 03/03/2015  Decreased Interest 3 0 - 1  Down, Depressed, Hopeless 3 0 - 1  PHQ - 2 Score 6 0 - 2  Altered sleeping - - 1 2  Tired, decreased energy - - 1 2  Change in appetite - - - 1  Feeling bad or failure about yourself  - - - 1  Trouble concentrating - - - 3  Moving slowly or fidgety/restless - - - 0  Suicidal thoughts - - - 0  PHQ-9 Score - - - 11    Review of Systems  Constitutional: Negative.   HENT: Negative.   Eyes: Negative.   Respiratory: Negative.   Gastrointestinal: Negative.   Endocrine: Negative.   Genitourinary: Negative.   Musculoskeletal: Negative.   Skin: Negative.   Allergic/Immunologic: Negative.   Neurological: Positive for numbness.  Hematological: Negative.   Psychiatric/Behavioral: Positive for  dysphoric mood. The patient is nervous/anxious.   All other systems reviewed and are negative.      Objective:   Physical Exam  Constitutional: He is oriented to person, place, and time. He appears well-developed and well-nourished.  HENT:  Head: Normocephalic and atraumatic.  Neck: Normal range of motion. Neck supple.  Cervical Paraspinal Tenderness: C-5-C-6  Cardiovascular: Normal rate and regular rhythm.   Pulmonary/Chest: Effort normal and breath sounds normal.  Musculoskeletal:  Normal Muscle Bulk and Muscle Testing Reveals: Upper Extremities: Full ROM and Muscle Strength 5/5 Thoracic Paraspinal Tenderness: T-1-T-3 Lower Extremities: Full ROM and Muscle Strength 5/5 Wearing Left Ankle Brace Arises from chair with ease Narrow Based Gait      Neurological: He is alert and oriented to person, place, and time.  Skin: Skin is warm and dry.  Psychiatric: He has a normal mood and affect.  Nursing note and vitals reviewed.         Assessment & Plan:  1. Chronic foot and ankle pain, left more than right: hx of multiple foot surgeries as a child due to ?devlopmental disorder. Continue with Daily stretches. 04/04/2017 Refilled: Percocet 10/325 mg one tablet every 8 hours as needed #90. We will continue the opioid monitoring program, this consists of regular clinic visits, examinations, urine drug screen, pill counts as well as use of [place] controlled substance reporting system. 2. Mild achilles tendonitis/right calcaneal spur: Follow up with Dr. Victorino Dike For left ankle/foot fusion. On hold due to financial hardship. 04/04/2017 3. Morbid Obesity : Continue to Live Healthy Diet and Exercise regime. 04/04/2017 4. Anxiety disorder: Continue Xanax. 04/04/2017 5. Neuropathy: Continue Gabapentin. 04/04/2017  20 minutes of face to face patient care time was spent during this visit. All questions were encouraged and answered.   F/U in 1 month

## 2017-04-04 NOTE — Telephone Encounter (Signed)
On 04/04/2017 the  NCCSR was reviewed no conflict was seen on the [facility] Reporting System with multiple prescribers. Brian Rowe has a signed narcotic contract with our office. If there were any discrepancies this would have been reported to his physician.

## 2017-04-10 ENCOUNTER — Telehealth: Payer: Self-pay | Admitting: *Deleted

## 2017-04-10 NOTE — Telephone Encounter (Signed)
Greig Castillandrew called and said that he lost his Rx for his insole Dr [name] gave him.  He thinks it starts with an H.  He needs a call back with the name.  He has an appt with [name] on 05/08/17 and can get a new one at that time.

## 2017-04-24 ENCOUNTER — Telehealth: Payer: Self-pay | Admitting: *Deleted

## 2017-04-24 NOTE — Telephone Encounter (Signed)
Brian Rowe is calling about disabiilty papers he dropped off.  He says if there is a fax number they can be faxed or he has appt 05/08/17 and can pick them up then and fax them himself.  Are they in process?

## 2017-04-25 ENCOUNTER — Telehealth: Payer: Self-pay | Admitting: Registered Nurse

## 2017-04-25 ENCOUNTER — Telehealth: Payer: Self-pay | Admitting: Physical Medicine & Rehabilitation

## 2017-04-25 DIAGNOSIS — M7661 Achilles tendinitis, right leg: Secondary | ICD-10-CM

## 2017-04-25 DIAGNOSIS — G609 Hereditary and idiopathic neuropathy, unspecified: Secondary | ICD-10-CM

## 2017-04-25 DIAGNOSIS — M19071 Primary osteoarthritis, right ankle and foot: Secondary | ICD-10-CM

## 2017-04-25 NOTE — Telephone Encounter (Signed)
Patient delivered extensive disability paperwork for completion -Dr [name]  asked that PT do review FLE.  [place] papers to [employer] to deliver to PT. Will put originals in intraoffice if needed.

## 2017-04-25 NOTE — Telephone Encounter (Signed)
Order placed for Physical Therapy for Disability Paper Work.

## 2017-05-08 ENCOUNTER — Encounter
Payer: BLUE CROSS/BLUE SHIELD | Attending: Physical Medicine & Rehabilitation | Admitting: Physical Medicine & Rehabilitation

## 2017-05-08 ENCOUNTER — Encounter: Payer: Self-pay | Admitting: Physical Medicine & Rehabilitation

## 2017-05-08 VITALS — BP 156/77 | HR 64 | Resp 14

## 2017-05-08 DIAGNOSIS — Z5181 Encounter for therapeutic drug level monitoring: Secondary | ICD-10-CM | POA: Diagnosis not present

## 2017-05-08 DIAGNOSIS — M25572 Pain in left ankle and joints of left foot: Secondary | ICD-10-CM | POA: Diagnosis not present

## 2017-05-08 DIAGNOSIS — M1990 Unspecified osteoarthritis, unspecified site: Secondary | ICD-10-CM | POA: Insufficient documentation

## 2017-05-08 DIAGNOSIS — M25571 Pain in right ankle and joints of right foot: Secondary | ICD-10-CM | POA: Insufficient documentation

## 2017-05-08 DIAGNOSIS — M79671 Pain in right foot: Secondary | ICD-10-CM | POA: Insufficient documentation

## 2017-05-08 DIAGNOSIS — M7661 Achilles tendinitis, right leg: Secondary | ICD-10-CM | POA: Diagnosis not present

## 2017-05-08 DIAGNOSIS — M19272 Secondary osteoarthritis, left ankle and foot: Secondary | ICD-10-CM | POA: Diagnosis not present

## 2017-05-08 DIAGNOSIS — M79672 Pain in left foot: Secondary | ICD-10-CM | POA: Insufficient documentation

## 2017-05-08 DIAGNOSIS — M19072 Primary osteoarthritis, left ankle and foot: Secondary | ICD-10-CM | POA: Diagnosis not present

## 2017-05-08 DIAGNOSIS — Z79899 Other long term (current) drug therapy: Secondary | ICD-10-CM | POA: Diagnosis not present

## 2017-05-08 DIAGNOSIS — F419 Anxiety disorder, unspecified: Secondary | ICD-10-CM | POA: Insufficient documentation

## 2017-05-08 DIAGNOSIS — M7660 Achilles tendinitis, unspecified leg: Secondary | ICD-10-CM | POA: Diagnosis not present

## 2017-05-08 DIAGNOSIS — G8929 Other chronic pain: Secondary | ICD-10-CM | POA: Insufficient documentation

## 2017-05-08 MED ORDER — OXYCODONE-ACETAMINOPHEN 10-325 MG PO TABS: 1.0000 | ORAL_TABLET | Freq: Three times a day (TID) | ORAL | 0 refills | Status: DC | PRN

## 2017-05-08 NOTE — Patient Instructions (Signed)
PLEASE FEEL FREE TO CALL OUR OFFICE WITH ANY PROBLEMS OR QUESTIONS ([phone])      

## 2017-05-08 NOTE — Progress Notes (Signed)
Subjective:    Patient ID: Brian Rowe, male    DOB: [birth year], 41 y.o.   MRN: [id number]  HPI   Brian Rowe is here in follow up of his chronic pain in his feet. His pain levels are fairly stable. His left ankle remains most problematic. He wears the orthotic in the shoe but still tends to roll on his lateral foot. He is interested in orthotics again as we discussed previously.      Pain Inventory Average Pain 7 Pain Right Now 9 My pain is sharp, dull, stabbing and aching  In the last 24 hours, has pain interfered with the following? General activity 3 Relation with others 2 Enjoyment of life 3 What TIME of day is your pain at its worst? daytime Sleep (in general) Poor  Pain is worse with: walking, inactivity, standing and some activites Pain improves with: rest, medication and injections Relief from Meds: 4  Mobility walk without assistance use a cane how many minutes can you walk? 15 ability to climb steps?  no do you drive?  yes Do you have any goals in this area?  no  Function disabled: date disabled . I need assistance with the following:  household duties and shopping  Neuro/Psych numbness trouble walking depression anxiety  Prior Studies Any changes since last visit?  no  Physicians involved in your care Any changes since last visit?  no   Family History  Problem Relation Age of Onset  . Hypertension Father    Social History   Social History  . Marital status: Single    Spouse name: N/A  . Number of children: N/A  . Years of education: N/A   Social History Main Topics  . Smoking status: Former Smoker    Quit date: 12/06/2011  . Smokeless tobacco: Never Used  . Alcohol use 0.6 oz/week    1 Cans of beer per week  . Drug use: No  . Sexual activity: Yes   Other Topics Concern  . None   Social History Narrative  . None   Past Surgical History:  Procedure Laterality Date  . left ankle Left   . NASAL SEPTUM SURGERY    . THROAT  SURGERY     Past Medical History:  Diagnosis Date  . ANXIETY 07/19/2007  . DEPRESSION 07/19/2007  . HEMORRHOIDS, INTERNAL 12/01/2008  . HYPERLIPIDEMIA 07/19/2007  . HYPERTENSION 07/19/2007  . Sleep apnea   . VISUAL IMPAIRMENT 10/15/2007   BP (!) 156/77 (BP Location: Left Arm, Patient Position: Sitting, Cuff Size: Large)   Pulse 64   Resp 14   SpO2 96%   Opioid Risk Score:   Fall Risk Score:  `1  Depression screen PHQ 2/9  Depression screen [facility] 2/9 04/04/2017 08/29/2016 02/01/2016 03/03/2015  Decreased Interest 3 0 - 1  Down, Depressed, Hopeless 3 0 - 1  PHQ - 2 Score 6 0 - 2  Altered sleeping - - 1 2  Tired, decreased energy - - 1 2  Change in appetite - - - 1  Feeling bad or failure about yourself  - - - 1  Trouble concentrating - - - 3  Moving slowly or fidgety/restless - - - 0  Suicidal thoughts - - - 0  PHQ-9 Score - - - 11    Review of Systems  Constitutional: Negative.   HENT: Negative.   Eyes: Negative.   Respiratory: Negative.   Cardiovascular: Negative.   Gastrointestinal: Negative.   Endocrine: Negative.   Genitourinary: Negative.  Musculoskeletal: Positive for gait problem.  Skin: Negative.   Neurological: Positive for numbness.  Hematological: Negative.   Psychiatric/Behavioral: Positive for dysphoric mood. The patient is nervous/anxious.   All other systems reviewed and are negative.      Objective:   Physical Exam  General: Alert and oriented x 3, No apparent distress. Obese large framed  HEENT:Head is normocephalic, atraumatic, PERRLA, EOMI, sclera anicteric, oral mucosa pink and moist, dentition intact, ext ear canals clear,  Neck:Supple without JVD or lymphadenopathy  Heart:Reg rate and rhythm. No murmurs rubs or gallops  Chest:CTA bilaterally without wheezes, rales, or rhonchi; no distress  Abdomen:Soft, non-tender, non-distended, bowel sounds positive.  Extremities:pulses intact  Skin:Clean and intact without signs of breakdown    Neuro: normal motor function. Decreased LT in hands. Motor limited by pain in feet.  Musculoskeletal:Full ROM, No pain with AROM or PROM in the neck, trunk, or upper extremities. he has a high arch on the left foot. Mild equinovarus deformity left foot. Decrease ankle dorsiflexion--only about 80 degree dorsiflexion.  Mild pain over the achilles and calcaneus.  uses a steppage gait pattern on left.    He tends to rest on the lateral side of his foot in supination  Psych:Pt's affect is appropriate. Pt is cooperative. Talkative but very pleasant    Assessment & Plan:  1. Chronic foot and ankle pain, left more than right---hx of multiple foot surgeries as a child due to ?devlopmental disorder. 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  2. Mild achilles tendonitis, ?right calcaneal spur  3. Morbid Obesity  4. Anxiety disorder    Plan:  1. Discussed the importance of regular LLE stretching and trying to normalize gait mechanics as much as possible 2. Recommend bilateral CTS splints. Surgery potentially later this year. 3. Made another referral to [facility] for left ankle/foot orthotic to help control inversion/ equinovarus deformity 4. Continue percocet 10/325, one q8 prn #90.  A second rx was provided for next month. We will continue the opioid monitoring program, this consists of regular clinic visits, examinations, urine drug screen, pill counts as well as use of [place] Controlled Substance Reporting System. [place] today.  A UDS was collected today 5. meloxicam daily 6. Continue gabapentin for leg pain. 7.Follow up with me or NP in about 2 months. 20 minutes of face to face patient care time were spent during this visit. All questions were encouraged and answered.

## 2017-05-09 ENCOUNTER — Encounter: Payer: BLUE CROSS/BLUE SHIELD | Admitting: Registered Nurse

## 2017-05-11 LAB — TOXASSURE SELECT,+ANTIDEPR,UR

## 2017-05-15 ENCOUNTER — Telehealth: Payer: Self-pay | Admitting: *Deleted

## 2017-05-15 NOTE — Telephone Encounter (Signed)
Urine drug screen for this encounter is unable to be determined if consistent as no record of last dose of medication taken.  Oxycodone is prescribed but it is absent. We are not the prescribers of his alprazolam or amphetamine and fluoxetine.

## 2017-05-30 ENCOUNTER — Telehealth: Payer: Self-pay | Admitting: Physical Medicine & Rehabilitation

## 2017-06-14 ENCOUNTER — Ambulatory Visit: Payer: BLUE CROSS/BLUE SHIELD | Attending: Registered Nurse

## 2017-06-14 DIAGNOSIS — G8929 Other chronic pain: Secondary | ICD-10-CM | POA: Insufficient documentation

## 2017-06-14 DIAGNOSIS — M19072 Primary osteoarthritis, left ankle and foot: Secondary | ICD-10-CM | POA: Insufficient documentation

## 2017-06-14 DIAGNOSIS — M545 Low back pain, unspecified: Secondary | ICD-10-CM

## 2017-06-14 DIAGNOSIS — G5603 Carpal tunnel syndrome, bilateral upper limbs: Secondary | ICD-10-CM

## 2017-06-14 DIAGNOSIS — M7661 Achilles tendinitis, right leg: Secondary | ICD-10-CM | POA: Diagnosis present

## 2017-06-14 NOTE — Therapy (Signed)
[facility] [address] [place], [place], [address] Phone: [phone]   Fax:  [phone]  Physical Therapy Evaluation/FCE  Patient Details  Name: Brian Rowe MRN: [id number] Date of Birth: 1976/07/19 Referring Provider: Faith RogueZachary Swartz, MD  Encounter Date: 06/14/2017      PT End of Session - 06/14/17 1122    Visit Number 1   Number of Visits 1   Authorization Type BCBS   PT Start Time 0700   PT Stop Time 1045   PT Time Calculation (min) 225 min   Activity Tolerance Patient tolerated treatment well;No increased pain   Behavior During Therapy WFL for tasks assessed/performed      Past Medical History:  Diagnosis Date  . ANXIETY 07/19/2007  . DEPRESSION 07/19/2007  . HEMORRHOIDS, INTERNAL 12/01/2008  . HYPERLIPIDEMIA 07/19/2007  . HYPERTENSION 07/19/2007  . Sleep apnea   . VISUAL IMPAIRMENT 10/15/2007    Past Surgical History:  Procedure Laterality Date  . left ankle Left   . NASAL SEPTUM SURGERY    . THROAT SURGERY      There were no vitals filed for this visit.       Subjective Assessment - 06/14/17 0657    Subjective See FCE report in EPIC            [facility] - 06/14/17 0001      Assessment   Medical Diagnosis Foot OA   Referring Provider Faith RogueZachary Swartz, MD     Precautions   Precautions None     Restrictions   Weight Bearing Restrictions No     Cognition   Overall Cognitive Status Within Functional Limits for tasks assessed     Ambulation/Gait   Gait Comments SPC used on Lt side carried with LT Leg for support with lheavy lean onto cane. Antalgic gait            Objective measurements completed on examination: See above findings.    See FCE report in EPIC for testing results. ERGOSCIENCE  FCE protocol used              PT Education - 06/14/17 1121    Education provided Yes   Education Details Mr [name] was advised to rest for next 2-3 days as he may  have soem increased pain due to incr activity level over normal levels and any extra pain should ease off after 2-4 days. He should call MD if incr pain persists   Person(s) Educated Patient   Methods Explanation   Comprehension Verbalized understanding                     Plan - 06/14/17 1123    Clinical Impression Statement Mr [name] presesnt with chronic LT ankle pain and also LBP RT shoulder pain and CTS bilaterally. 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  It appeared he gave a sincere effort for all tasks  His overall rating for work was sedentary. See results in report scanned into EPIC   Clinical Presentation Stable   Clinical Decision Making High   PT Frequency One time visit   Consulted and Agree with Plan of Care Patient      Patient will benefit from skilled therapeutic intervention in order to improve the following deficits and impairments:     Visit Diagnosis: Osteoarthritis of left ankle, unspecified osteoarthritis type - Plan: PT plan of care cert/re-cert  Tendonitis,  Achilles, right - Plan: PT plan of care cert/re-cert  Chronic bilateral low back pain without sciatica - Plan: PT plan of care cert/re-cert  Bilateral carpal tunnel syndrome - Plan: PT plan of care cert/re-cert     Problem List Patient Active Problem List   Diagnosis Date Noted  . Bilateral carpal tunnel syndrome 10/13/2015  . Hypothyroidism 05/29/2015  . Degenerative arthritis of left ankle 12/30/2014  . Tendonitis, Achilles, right 12/30/2014  . Obesity, morbid (HCC) 08/04/2014  . OSA on CPAP 08/04/2014  . Thoracic back pain 07/23/2012  . GASTROENTERITIS, VIRAL 05/07/2010  . SINUSITIS- ACUTE-NOS 12/26/2008  . HEMORRHOIDS, INTERNAL 12/01/2008  . VISUAL IMPAIRMENT 10/15/2007  . HEMATURIA 10/03/2007  . Dyslipidemia 07/19/2007  . ANXIETY 07/19/2007  . DEPRESSION 07/19/2007  . Essential hypertension 07/19/2007  . LOW  BACK PAIN 07/19/2007    Caprice Red  PT 06/14/2017, 11:45 AM  [facility] [address] [place], [place], [address] Phone: [phone]   Fax:  [phone]  Name: [name] MRN: [id number] Date of Birth: May 06, 1976

## 2017-06-20 ENCOUNTER — Telehealth: Payer: Self-pay | Admitting: Physical Medicine & Rehabilitation

## 2017-06-20 NOTE — Telephone Encounter (Signed)
He is asking for a copy of the test and it to be sent to his lawyer. Misty StanleyLisa I am not sure if this is something we release.

## 2017-06-20 NOTE — Telephone Encounter (Signed)
Pt phoned stated that the results from his drug test from physical therapy were supposed to be sent to Dr. [name], and that he would like for someone to go over those results with him. Pt stated this is a time sensitive issue because he has a disability hearing coming up in the next week. Pt also stated [facility] was [facility]. Pt would [facility] because [facility] take his [facility], and [facility] does not. Please advise.

## 2017-06-20 NOTE — Telephone Encounter (Signed)
This has to do with the functional capacity evaluation  Not a drug test. Patient is curious about the results of this evaluation that was performed to satisfy disability paperwork.Marland Kitchen.Marland Kitchen.Marland Kitchen.Please advise

## 2017-06-21 ENCOUNTER — Telehealth: Payer: Self-pay

## 2017-06-21 NOTE — Telephone Encounter (Signed)
Error

## 2017-06-21 NOTE — Telephone Encounter (Signed)
Don't believe the FCE has come across my desk yet.

## 2017-06-21 NOTE — Telephone Encounter (Signed)
His FCE was reviewed. I talked to the patient. His lawyer will contact us and likely request a copy.  Thanks.

## 2017-06-23 ENCOUNTER — Telehealth: Payer: Self-pay | Admitting: Physical Medicine & Rehabilitation

## 2017-06-23 NOTE — Telephone Encounter (Signed)
F[facility] --[facility] - copy for ptn in accordion up front if needed

## 2017-06-26 ENCOUNTER — Telehealth: Payer: Self-pay | Admitting: Physical Medicine & Rehabilitation

## 2017-06-26 ENCOUNTER — Telehealth: Payer: Self-pay | Admitting: *Deleted

## 2017-06-26 NOTE — Telephone Encounter (Signed)
error 

## 2017-06-26 NOTE — Telephone Encounter (Signed)
Patient called back and would like for us to send [facility] for his orth[facility].  Any questions please call him.

## 2017-06-26 NOTE — Telephone Encounter (Signed)
Brian Rowe says that his ortho makes orthotics and would like to have an order sent there instead of [facility]. Left message on [name]'s VM to call back and leave information on the clinic line of who his ortho is before we can determine if that is appropriate.

## 2017-06-26 NOTE — Telephone Encounter (Signed)
Phoned home told him FCE [facility] [phone] we put in request for ZS to do order to [facility] for Brace

## 2017-06-27 NOTE — Telephone Encounter (Signed)
I believe I wrote him an rx to [facility].....should work for [facility] too although I would prefer the former.

## 2017-06-28 NOTE — Telephone Encounter (Signed)
Left him the message.

## 2017-07-07 ENCOUNTER — Encounter: Payer: BLUE CROSS/BLUE SHIELD | Attending: Physical Medicine & Rehabilitation | Admitting: Registered Nurse

## 2017-07-07 ENCOUNTER — Encounter: Payer: Self-pay | Admitting: Registered Nurse

## 2017-07-07 VITALS — BP 114/81 | HR 71

## 2017-07-07 DIAGNOSIS — M7660 Achilles tendinitis, unspecified leg: Secondary | ICD-10-CM | POA: Diagnosis not present

## 2017-07-07 DIAGNOSIS — M19072 Primary osteoarthritis, left ankle and foot: Secondary | ICD-10-CM

## 2017-07-07 DIAGNOSIS — G894 Chronic pain syndrome: Secondary | ICD-10-CM | POA: Diagnosis not present

## 2017-07-07 DIAGNOSIS — M19272 Secondary osteoarthritis, left ankle and foot: Secondary | ICD-10-CM

## 2017-07-07 DIAGNOSIS — M25571 Pain in right ankle and joints of right foot: Secondary | ICD-10-CM | POA: Insufficient documentation

## 2017-07-07 DIAGNOSIS — G8929 Other chronic pain: Secondary | ICD-10-CM | POA: Insufficient documentation

## 2017-07-07 DIAGNOSIS — M25572 Pain in left ankle and joints of left foot: Secondary | ICD-10-CM | POA: Insufficient documentation

## 2017-07-07 DIAGNOSIS — M79671 Pain in right foot: Secondary | ICD-10-CM | POA: Insufficient documentation

## 2017-07-07 DIAGNOSIS — M62830 Muscle spasm of back: Secondary | ICD-10-CM | POA: Diagnosis not present

## 2017-07-07 DIAGNOSIS — M7661 Achilles tendinitis, right leg: Secondary | ICD-10-CM

## 2017-07-07 DIAGNOSIS — M79672 Pain in left foot: Secondary | ICD-10-CM | POA: Diagnosis not present

## 2017-07-07 DIAGNOSIS — Z5181 Encounter for therapeutic drug level monitoring: Secondary | ICD-10-CM

## 2017-07-07 DIAGNOSIS — F419 Anxiety disorder, unspecified: Secondary | ICD-10-CM | POA: Insufficient documentation

## 2017-07-07 DIAGNOSIS — M1990 Unspecified osteoarthritis, unspecified site: Secondary | ICD-10-CM | POA: Insufficient documentation

## 2017-07-07 DIAGNOSIS — Z79899 Other long term (current) drug therapy: Secondary | ICD-10-CM

## 2017-07-07 MED ORDER — OXYCODONE-ACETAMINOPHEN 10-325 MG PO TABS: 1.0000 | ORAL_TABLET | Freq: Three times a day (TID) | ORAL | 0 refills | Status: DC | PRN

## 2017-07-07 MED ORDER — CYCLOBENZAPRINE HCL 10 MG PO TABS: 10.0000 mg | ORAL_TABLET | Freq: Every day | ORAL | 2 refills | Status: DC | PRN

## 2017-07-07 NOTE — Progress Notes (Signed)
Subjective:    Patient ID: Brian Rowe, male    DOB: 1976-09-30, 41 y.o.   MRN: [id number]  HPI: Brian Rowe is a [age]year old male who returns for follow up appointment and medication refill. He states his pain is located in his lower back,  left ankle and right heel. He rates his pain 8. His current exercise regime is walking short distances using cane for support.  His last UDS was performed on 05/08/2017, see note.   Pain Inventory Average Pain 7 Pain Right Now 8 My pain is sharp, burning, dull, tingling and aching  In the last 24 hours, has pain interfered with the following? General activity na Relation with others na Enjoyment of life na What TIME of day is your pain at its worst? na Sleep (in general) Fair  Pain is worse with: walking, bending, inactivity, standing and some activites Pain improves with: rest, heat/ice, therapy/exercise and medication Relief from Meds: 5  Mobility use a cane ability to climb steps?  no do you drive?  yes  Function disabled: date disabled in process  Neuro/Psych depression anxiety  Prior Studies Any changes since last visit?  no  Physicians involved in your care Any changes since last visit?  no   Family History  Problem Relation Age of Onset  . Hypertension Father    Social History   Social History  . Marital status: Single    Spouse name: N/A  . Number of children: N/A  . Years of education: N/A   Social History Main Topics  . Smoking status: Former Smoker    Quit date: 12/06/2011  . Smokeless tobacco: Never Used  . Alcohol use 0.6 oz/week    1 Cans of beer per week  . Drug use: No  . Sexual activity: Yes   Other Topics Concern  . Not on file   Social History Narrative  . No narrative on file   Past Surgical History:  Procedure Laterality Date  . left ankle Left   . NASAL SEPTUM SURGERY    . THROAT SURGERY     Past Medical History:  Diagnosis Date  . ANXIETY 07/19/2007  . DEPRESSION  07/19/2007  . HEMORRHOIDS, INTERNAL 12/01/2008  . HYPERLIPIDEMIA 07/19/2007  . HYPERTENSION 07/19/2007  . Sleep apnea   . VISUAL IMPAIRMENT 10/15/2007   There were no vitals taken for this visit.  Opioid Risk Score:   Fall Risk Score:  `1  Depression screen PHQ 2/9  Depression screen [facility] 2/9 04/04/2017 08/29/2016 02/01/2016 03/03/2015  Decreased Interest 3 0 - 1  Down, Depressed, Hopeless 3 0 - 1  PHQ - 2 Score 6 0 - 2  Altered sleeping - - 1 2  Tired, decreased energy - - 1 2  Change in appetite - - - 1  Feeling bad or failure about yourself  - - - 1  Trouble concentrating - - - 3  Moving slowly or fidgety/restless - - - 0  Suicidal thoughts - - - 0  PHQ-9 Score - - - 11     Review of Systems  Constitutional: Negative.   HENT: Negative.   Eyes: Negative.   Respiratory: Negative.   Cardiovascular: Negative.   Gastrointestinal: Negative.   Endocrine: Negative.   Genitourinary: Negative.   Musculoskeletal: Negative.   Skin: Negative.   Allergic/Immunologic: Negative.   Neurological: Negative.   Hematological: Negative.   Psychiatric/Behavioral: Negative.   All other systems reviewed and are negative.  Objective:   Physical Exam  Constitutional: He is oriented to person, place, and time. He appears well-developed and well-nourished.  HENT:  Head: Normocephalic and atraumatic.  Neck: Normal range of motion. Neck supple.  Cardiovascular: Normal rate and regular rhythm.   Pulmonary/Chest: Effort normal and breath sounds normal.  Musculoskeletal:  Normal Muscle Bulk and Muscle Testing Reveals: Upper Extremities: Full ROM and Muscle Strength 5/5 Lumbar Paraspinal Tenderness: L-3-L-5 Lower Extremities: Full ROM and Muscle Strength 5/5 Right Lower Extremity Flexion Produces Pain into Lumbar Arises from Table with ease Wearing Left Ankle Brace Narrow Based Gait   Neurological: He is alert and oriented to person, place, and time.  Skin: Skin is warm and dry.    Psychiatric: He has a normal mood and affect.  Nursing note and vitals reviewed.         Assessment & Plan:  1. Chronic foot and ankle pain, left more than right: hx of multiple foot surgeries as a child due to ?devlopmental disorder. Continue with Daily stretches. 07/07/2017 Refilled: Percocet 10/325 mg one tablet every 8 hours as needed #90. Second script given for the following month. We will continue the opioid monitoring program, this consists of regular clinic visits, examinations, urine drug screen, pill counts as well as use of [place] controlled [place]. 2. Mild achilles tendonitis/right calcaneal spur: Follow up with Dr. Victorino DikeHewitt For left ankle/foot fusion. On hold due to financial hardship. 07/07/2017 3. Morbid Obesity : Continue to Live Healthy Diet and Exercise regime. 07/07/2017 4. Anxiety disorder: Continue Xanax. Psychiatry Following.07/07/2017 5. Neuropathy: Continue Gabapentin. 07/07/2017  20 minutes of face to face patient care time was spent during this visit. All questions were encouraged and answered.  F/U in 1 month

## 2017-07-10 ENCOUNTER — Ambulatory Visit: Payer: BLUE CROSS/BLUE SHIELD | Admitting: Registered Nurse

## 2017-08-09 ENCOUNTER — Telehealth: Payer: Self-pay | Admitting: Physical Medicine & Rehabilitation

## 2017-08-09 NOTE — Telephone Encounter (Signed)
Called patient back and thanked him for the information and informed him that if the surgeon gives him pain medication, let us know again, and to feel free to take said medication but not to take the medication prescribed by this practice until he has completed the medication from the surgeon.

## 2017-08-09 NOTE — Telephone Encounter (Signed)
Patient is having carpal tunnel surgery on 9/26 with Dr. Melvyn Novasrtmann.  He wanted to let us know due to pain contract.  Please call patient if there is any issues.

## 2017-08-23 ENCOUNTER — Telehealth: Payer: Self-pay | Admitting: Internal Medicine

## 2017-08-23 NOTE — Telephone Encounter (Signed)
° ° ° °  Alona Bene with the surgical center call to ask if the pt last office notes can be faxed over to her. Looks like pt last visit was 08/29/16    Fax number [phone]

## 2017-08-24 ENCOUNTER — Encounter: Payer: Self-pay | Admitting: Internal Medicine

## 2017-08-25 NOTE — Telephone Encounter (Signed)
Office notes were faxed

## 2017-08-28 ENCOUNTER — Other Ambulatory Visit: Payer: Self-pay | Admitting: Family Medicine

## 2017-09-04 ENCOUNTER — Encounter: Payer: BLUE CROSS/BLUE SHIELD | Attending: Physical Medicine & Rehabilitation | Admitting: Registered Nurse

## 2017-09-04 ENCOUNTER — Encounter: Payer: Self-pay | Admitting: Registered Nurse

## 2017-09-04 VITALS — BP 116/86 | HR 96

## 2017-09-04 DIAGNOSIS — M7660 Achilles tendinitis, unspecified leg: Secondary | ICD-10-CM | POA: Insufficient documentation

## 2017-09-04 DIAGNOSIS — G894 Chronic pain syndrome: Secondary | ICD-10-CM | POA: Diagnosis not present

## 2017-09-04 DIAGNOSIS — G8929 Other chronic pain: Secondary | ICD-10-CM | POA: Diagnosis not present

## 2017-09-04 DIAGNOSIS — M79672 Pain in left foot: Secondary | ICD-10-CM | POA: Diagnosis not present

## 2017-09-04 DIAGNOSIS — F419 Anxiety disorder, unspecified: Secondary | ICD-10-CM | POA: Insufficient documentation

## 2017-09-04 DIAGNOSIS — M79671 Pain in right foot: Secondary | ICD-10-CM | POA: Diagnosis not present

## 2017-09-04 DIAGNOSIS — M62830 Muscle spasm of back: Secondary | ICD-10-CM

## 2017-09-04 DIAGNOSIS — M7661 Achilles tendinitis, right leg: Secondary | ICD-10-CM

## 2017-09-04 DIAGNOSIS — M25572 Pain in left ankle and joints of left foot: Secondary | ICD-10-CM | POA: Insufficient documentation

## 2017-09-04 DIAGNOSIS — M19272 Secondary osteoarthritis, left ankle and foot: Secondary | ICD-10-CM

## 2017-09-04 DIAGNOSIS — M19072 Primary osteoarthritis, left ankle and foot: Secondary | ICD-10-CM | POA: Diagnosis not present

## 2017-09-04 DIAGNOSIS — M25571 Pain in right ankle and joints of right foot: Secondary | ICD-10-CM | POA: Insufficient documentation

## 2017-09-04 DIAGNOSIS — Z5181 Encounter for therapeutic drug level monitoring: Secondary | ICD-10-CM

## 2017-09-04 DIAGNOSIS — M1990 Unspecified osteoarthritis, unspecified site: Secondary | ICD-10-CM | POA: Insufficient documentation

## 2017-09-04 DIAGNOSIS — Z79899 Other long term (current) drug therapy: Secondary | ICD-10-CM

## 2017-09-04 MED ORDER — OXYCODONE-ACETAMINOPHEN 10-325 MG PO TABS: 1.0000 | ORAL_TABLET | Freq: Three times a day (TID) | ORAL | 0 refills | Status: DC | PRN

## 2017-09-04 NOTE — Progress Notes (Signed)
Subjective:    Patient ID: BLAIZE EPPLE, male    DOB: 13-Feb-1976, 41 y.o.   MRN: [id number]  HPI: [name] is a 41year old male who returns for follow up appointment and medication refill. He states his pain is located in his left ankle and right heel. He rates his pain 8. His current exercise regime is walking short distances.  Mr. Magos Morphine equivalent is . He is also prescribed Xanax and Adderall by Dr. Evelene Croon.We have discussed the black box warning of using opioids and benzodiazepines. He is being closely monitored and under the care of his psychiatrist Dr. Evelene Croon he verbalizes understanding.    His last UDS was performed on 05/08/2017, see note.   Pain Inventory Average Pain 8 Pain Right Now 8 My pain is sharp, burning, dull, stabbing, tingling and aching  In the last 24 hours, has pain interfered with the following? General activity 3 Relation with others 3 Enjoyment of life 3 What TIME of day is your pain at its worst? morning and night Sleep (in general) Fair  Pain is worse with: walking, inactivity, standing and some activites Pain improves with: rest, therapy/exercise, medication and injections Relief from Meds: 5  Mobility use a cane  Function disabled: date disabled in process  Neuro/Psych depression anxiety  Prior Studies Any changes since last visit?  no  Physicians involved in your care Any changes since last visit?  no   Family History  Problem Relation Age of Onset  . Hypertension Father    Social History   Social History  . Marital status: Single    Spouse name: N/A  . Number of children: N/A  . Years of education: N/A   Social History Main Topics  . Smoking status: Former Smoker    Quit date: 12/06/2011  . Smokeless tobacco: Never Used  . Alcohol use 0.6 oz/week    1 Cans of beer per week  . Drug use: No  . Sexual activity: Yes   Other Topics Concern  . Not on file   Social History Narrative  . No narrative  on file   Past Surgical History:  Procedure Laterality Date  . left ankle Left   . NASAL SEPTUM SURGERY    . THROAT SURGERY     Past Medical History:  Diagnosis Date  . ANXIETY 07/19/2007  . DEPRESSION 07/19/2007  . HEMORRHOIDS, INTERNAL 12/01/2008  . HYPERLIPIDEMIA 07/19/2007  . HYPERTENSION 07/19/2007  . Sleep apnea   . VISUAL IMPAIRMENT 10/15/2007   BP 116/86   Pulse 96   SpO2 96%   Opioid Risk Score:   Fall Risk Score:  `1  Depression screen PHQ 2/9  Depression screen [facility] 2/9 09/04/2017 04/04/2017 08/29/2016 02/01/2016 03/03/2015  Decreased Interest 0 3 0 - 1  Down, Depressed, Hopeless 0 3 0 - 1  PHQ - 2 Score 0 6 0 - 2  Altered sleeping - - - 1 2  Tired, decreased energy - - - 1 2  Change in appetite - - - - 1  Feeling bad or failure about yourself  - - - - 1  Trouble concentrating - - - - 3  Moving slowly or fidgety/restless - - - - 0  Suicidal thoughts - - - - 0  PHQ-9 Score - - - - 11     Review of Systems  Constitutional: Negative.   HENT: Negative.   Eyes: Negative.   Respiratory: Negative.   Cardiovascular: Negative.   Gastrointestinal:  Negative.   Endocrine: Negative.   Genitourinary: Negative.   Musculoskeletal: Negative.   Skin: Negative.   Allergic/Immunologic: Negative.   Neurological: Negative.   Hematological: Negative.   Psychiatric/Behavioral: Negative.   All other systems reviewed and are negative.      Objective:   Physical Exam  Constitutional: He is oriented to person, place, and time. He appears well-developed and well-nourished.  HENT:  Head: Normocephalic and atraumatic.  Neck: Normal range of motion. Neck supple.  Cardiovascular: Normal rate and regular rhythm.   Pulmonary/Chest: Effort normal and breath sounds normal.  Musculoskeletal:  Normal Muscle Bulk and Muscle Testing Reveals: Upper Extremities: Full ROM and Muscle Strength 5/5 Lumbar Paraspinal Tenderness: L-3-L-5 Lower Extremities: Full ROM and Muscle Strength  5/5 Right Lower Extremity Flexion Produces Pain into Lumbar Arises from Table with ease Wearing Left Ankle Brace Narrow Based Gait   Neurological: He is alert and oriented to person, place, and time.  Skin: Skin is warm and dry.  Psychiatric: He has a normal mood and affect.  Nursing note and vitals reviewed.         Assessment & Plan:  1. Chronic foot and ankle pain, left more than right: hx of multiple foot surgeries as a child due to ?devlopmental disorder. Continue with Daily stretches. 09/04/2017 Refilled: Percocet 10/325 mg one tablet every 8 hours as needed #90. Second script given for the following month. We will continue the opioid monitoring program, this consists of regular clinic visits, examinations, urine drug screen, pill counts as well as use of [place] controlled substance reporting system. 2. Mild achilles tendonitis/right calcaneal spur: Follow up with Dr. Victorino Dike For left ankle/foot fusion. On hold due to financial hardship. 09/04/2017 3. Morbid Obesity : Continue to Live Healthy Diet and Exercise regime. 09/04/2017 4. Anxiety disorder: Continue Xanax. Psychiatry Following.09/04/2017 5. Neuropathy: Continue Gabapentin. 09/04/2017  20 minutes of face to face patient care time was spent during this visit. All questions were encouraged and answered.  F/U in 1 month

## 2017-09-21 ENCOUNTER — Other Ambulatory Visit: Payer: Self-pay | Admitting: Internal Medicine

## 2017-09-21 DIAGNOSIS — R7989 Other specified abnormal findings of blood chemistry: Secondary | ICD-10-CM

## 2017-09-28 ENCOUNTER — Ambulatory Visit: Payer: BLUE CROSS/BLUE SHIELD

## 2017-09-28 ENCOUNTER — Other Ambulatory Visit: Payer: BLUE CROSS/BLUE SHIELD

## 2017-09-28 ENCOUNTER — Other Ambulatory Visit: Payer: Self-pay

## 2017-09-28 NOTE — Telephone Encounter (Signed)
Recieved faxed medication refill request for cyclobenzaprine, no mention in previous notes to use or continue to use this medication, please advise

## 2017-10-04 ENCOUNTER — Telehealth: Payer: Self-pay | Admitting: Registered Nurse

## 2017-10-04 MED ORDER — CYCLOBENZAPRINE HCL 10 MG PO TABS: 10.0000 mg | ORAL_TABLET | Freq: Every day | ORAL | 2 refills | Status: DC | PRN

## 2017-10-04 NOTE — Telephone Encounter (Signed)
Placed a call to [name] he states he is using his Flexeril, we will order. He verbalizes understanding.

## 2017-10-30 ENCOUNTER — Other Ambulatory Visit (INDEPENDENT_AMBULATORY_CARE_PROVIDER_SITE_OTHER): Payer: BLUE CROSS/BLUE SHIELD

## 2017-10-30 ENCOUNTER — Telehealth: Payer: Self-pay | Admitting: Family Medicine

## 2017-10-30 ENCOUNTER — Other Ambulatory Visit: Payer: Self-pay | Admitting: Internal Medicine

## 2017-10-30 DIAGNOSIS — R7989 Other specified abnormal findings of blood chemistry: Secondary | ICD-10-CM | POA: Diagnosis not present

## 2017-10-30 LAB — TSH: TSH: 8.16 u[IU]/mL — ABNORMAL HIGH (ref 0.35–4.50)

## 2017-10-30 MED ORDER — LEVOTHYROXINE SODIUM 100 MCG PO TABS: 100.0000 ug | ORAL_TABLET | Freq: Every day | ORAL | 0 refills | Status: DC

## 2017-10-30 NOTE — Telephone Encounter (Signed)
Copied from CRM [phone]. Topic: Quick Communication - Lab Results >> Oct 30, 2017  2:08 PM Leafy RoRobinson, Norma J wrote:  pt had tsh test today and his tsh was elevated. Pt would like to know the next step   See note on lab result page.

## 2017-10-31 ENCOUNTER — Other Ambulatory Visit: Payer: Self-pay | Admitting: Family Medicine

## 2017-10-31 ENCOUNTER — Encounter: Payer: Self-pay | Admitting: Physical Medicine & Rehabilitation

## 2017-10-31 ENCOUNTER — Other Ambulatory Visit: Payer: Self-pay

## 2017-10-31 ENCOUNTER — Encounter
Payer: BLUE CROSS/BLUE SHIELD | Attending: Physical Medicine & Rehabilitation | Admitting: Physical Medicine & Rehabilitation

## 2017-10-31 VITALS — BP 132/96 | HR 79

## 2017-10-31 DIAGNOSIS — M79671 Pain in right foot: Secondary | ICD-10-CM | POA: Insufficient documentation

## 2017-10-31 DIAGNOSIS — M7661 Achilles tendinitis, right leg: Secondary | ICD-10-CM | POA: Diagnosis not present

## 2017-10-31 DIAGNOSIS — M1990 Unspecified osteoarthritis, unspecified site: Secondary | ICD-10-CM | POA: Insufficient documentation

## 2017-10-31 DIAGNOSIS — M19272 Secondary osteoarthritis, left ankle and foot: Secondary | ICD-10-CM | POA: Diagnosis not present

## 2017-10-31 DIAGNOSIS — M25571 Pain in right ankle and joints of right foot: Secondary | ICD-10-CM | POA: Diagnosis not present

## 2017-10-31 DIAGNOSIS — M7660 Achilles tendinitis, unspecified leg: Secondary | ICD-10-CM | POA: Diagnosis not present

## 2017-10-31 DIAGNOSIS — G894 Chronic pain syndrome: Secondary | ICD-10-CM

## 2017-10-31 DIAGNOSIS — M25572 Pain in left ankle and joints of left foot: Secondary | ICD-10-CM | POA: Insufficient documentation

## 2017-10-31 DIAGNOSIS — M79672 Pain in left foot: Secondary | ICD-10-CM | POA: Diagnosis not present

## 2017-10-31 DIAGNOSIS — G8929 Other chronic pain: Secondary | ICD-10-CM | POA: Insufficient documentation

## 2017-10-31 DIAGNOSIS — F419 Anxiety disorder, unspecified: Secondary | ICD-10-CM | POA: Insufficient documentation

## 2017-10-31 DIAGNOSIS — Z79899 Other long term (current) drug therapy: Secondary | ICD-10-CM

## 2017-10-31 DIAGNOSIS — M19072 Primary osteoarthritis, left ankle and foot: Secondary | ICD-10-CM

## 2017-10-31 DIAGNOSIS — Z5181 Encounter for therapeutic drug level monitoring: Secondary | ICD-10-CM | POA: Diagnosis not present

## 2017-10-31 MED ORDER — CYCLOBENZAPRINE HCL 10 MG PO TABS: 10.0000 mg | ORAL_TABLET | Freq: Three times a day (TID) | ORAL | 3 refills | Status: DC | PRN

## 2017-10-31 MED ORDER — OXYCODONE-ACETAMINOPHEN 10-325 MG PO TABS: 1.0000 | ORAL_TABLET | Freq: Three times a day (TID) | ORAL | 0 refills | Status: DC | PRN

## 2017-10-31 NOTE — Addendum Note (Signed)
Addended by: Lynetta MareBOWMAN, Mc Bloodworth F on: 10/31/2017 11:02 AM   Modules accepted: Orders

## 2017-10-31 NOTE — Patient Instructions (Signed)
PLEASE FEEL FREE TO CALL OUR OFFICE WITH ANY PROBLEMS OR QUESTIONS ([phone])      

## 2017-10-31 NOTE — Progress Notes (Signed)
Subjective:    Patient ID: Brian Rowe, male    DOB: 08-06-1976, 41 y.o.   MRN: [id number]  HPI   Brian Rowe is here in follow up of his chronic pain. He had CTS surgery about 8 weeks ago. Since the surgery he's had ongoing pain along the right thumb,proximal hand. He was twisting off a hose a few days ago and felt a "pop" in his wrist followed by sudden onset of pain. It slowly improved on its own.   His feet continue to be problematic but are under control with medication.  He remains on Percocet 10/325 1 every 8 hours as needed.  He is using meloxicam daily as well with benefit and takes a Flexeril as needed for muscle spasms.  He asked if he can use this more than once per day  He did not have any success with the orthotics due to his insurance coverage.      Pain Inventory Average Pain 6 Pain Right Now 8 My pain is sharp, dull, stabbing and aching  In the last 24 hours, has pain interfered with the following? General activity 3 Relation with others 2 Enjoyment of life 3 What TIME of day is your pain at its worst? morning Sleep (in general) Poor  Pain is worse with: walking, inactivity, standing and some activites Pain improves with: rest, medication and injections Relief from Meds: 4  Mobility use a cane how many minutes can you walk? 15 ability to climb steps?  no do you drive?  yes Do you have any goals in this area?  yes  Function disabled: date disabled 07/2012 I need assistance with the following:  meal prep, household duties and shopping  Neuro/Psych depression anxiety  Prior Studies Any changes since last visit?  no  Physicians involved in your care Any changes since last visit?  no   Family History  Problem Relation Age of Onset  . Hypertension Father    Social History   Socioeconomic History  . Marital status: Single    Spouse name: None  . Number of children: None  . Years of education: None  . Highest education level: None  Social  Needs  . Financial resource strain: None  . Food insecurity - worry: None  . Food insecurity - inability: None  . Transportation needs - medical: None  . Transportation needs - non-medical: None  Occupational History  . None  Tobacco Use  . Smoking status: Former Smoker    Last attempt to quit: 12/06/2011    Years since quitting: 5.9  . Smokeless tobacco: Never Used  Substance and Sexual Activity  . Alcohol use: Yes    Alcohol/week: 0.6 oz    Types: 1 Cans of beer per week  . Drug use: No  . Sexual activity: Yes  Other Topics Concern  . None  Social History Narrative  . None   Past Surgical History:  Procedure Laterality Date  . left ankle Left   . NASAL SEPTUM SURGERY    . THROAT SURGERY     Past Medical History:  Diagnosis Date  . ANXIETY 07/19/2007  . DEPRESSION 07/19/2007  . HEMORRHOIDS, INTERNAL 12/01/2008  . HYPERLIPIDEMIA 07/19/2007  . HYPERTENSION 07/19/2007  . Sleep apnea   . VISUAL IMPAIRMENT 10/15/2007   BP (!) 132/96   Pulse 79   SpO2 95%   Opioid Risk Score:  4 Fall Risk Score:  `1  Depression screen PHQ 2/9  [facility] 10/31/2017 09/04/2017 04/04/2017  08/29/2016 02/01/2016 03/03/2015  Decreased Interest 1 0 3 0 - 1  Down, Depressed, Hopeless 1 0 3 0 - 1  PHQ - 2 Score 2 0 6 0 - 2  Altered sleeping - - - - 1 2  Tired, decreased energy - - - - 1 2  Change in appetite - - - - - 1  Feeling bad or failure about yourself  - - - - - 1  Trouble concentrating - - - - - 3  Moving slowly or fidgety/restless - - - - - 0  Suicidal thoughts - - - - - 0  PHQ-9 Score - - - - - 11      Review of Systems  Constitutional: Positive for unexpected weight change.  HENT: Negative.   Eyes: Negative.   Respiratory: Positive for apnea.   Cardiovascular: Negative.   Gastrointestinal: Negative.   Endocrine: Negative.   Genitourinary: Negative.   Musculoskeletal: Negative.   Skin: Negative.   Allergic/Immunologic: Negative.   Neurological: Negative.     Hematological: Negative.   Psychiatric/Behavioral: Negative.        Objective:   Physical Exam  General: Alert and oriented x 3, No apparent distress. Obese large framed  HEENT:Head is normocephalic, atraumatic, PERRLA, EOMI, sclera anicteric, oral mucosa pink and moist, dentition intact, ext ear canals clear,  Neck:Supple without JVD or lymphadenopathy  Heart:RRR without murmur. No JVD  Chest:CTA bilaterally without wheezes, rales, or rhonchi; no distress  Abdomen:Soft, non-tender, non-distended, bowel sounds positive.  Extremities:pulses intact Skin:Clean and intact without signs of breakdown  Neuro:normal motor function. Decreased LT median right hand.  Grip is 5/5, pinch is strong.  Musculoskeletal:Full ROM, No pain with AROM or PROM in the neck, trunk, or upper extremities. he has a high arch on the left foot. Mild equinovarus deformity left foot. Limited ankle dorsiflexion----about 80 degrees dorsiflexion.  still some steppage gait pattern on left.     Psych:Pt's affect is appropriate. Pt is cooperative. Talkative but very pleasant    Assessment & Plan:  1. Chronic foot and ankle pain, left more than right---hx of multiple foot surgeries as a child due to ?devlopmental disorder. 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  2. Mild achilles tendonitis, ?right calcaneal spur  3. Morbid Obesity  4. Anxiety disorder  5. Recent CTS release   Plan:  1. Continue with HEP 2. Continue with splint prn right wrist. Strengthening exercises as advised by ortho---reviewed a few today. 3. Appropriate shoe wear important.  4. Continue percocet 10/325, one q8 prn #90.  A second rx was provided for next month. We will continue the opioid monitoring program, this consists of regular clinic visits, examinations, routine drug screening, pill counts as well as use of [place]. [place].   5. meloxicam daily.   Increase Flexeril to 1 every 8 hours as needed #60 6. Continue gabapentin for leg pain. 7.Follow up with me or NP in about 2 months. 15 minutes of face to face patient care time were spent during this visit. All questions were encouraged and answered.

## 2017-11-03 NOTE — Telephone Encounter (Signed)
Dr K pt 

## 2017-11-20 ENCOUNTER — Other Ambulatory Visit: Payer: Self-pay | Admitting: Urology

## 2017-11-30 ENCOUNTER — Encounter: Payer: Self-pay | Admitting: Internal Medicine

## 2017-12-01 ENCOUNTER — Other Ambulatory Visit: Payer: Self-pay | Admitting: Family Medicine

## 2017-12-27 ENCOUNTER — Encounter
Payer: BLUE CROSS/BLUE SHIELD | Attending: Physical Medicine & Rehabilitation | Admitting: Physical Medicine & Rehabilitation

## 2017-12-27 ENCOUNTER — Other Ambulatory Visit: Payer: Self-pay

## 2017-12-27 ENCOUNTER — Encounter: Payer: Self-pay | Admitting: Physical Medicine & Rehabilitation

## 2017-12-27 VITALS — BP 149/104 | HR 97

## 2017-12-27 DIAGNOSIS — M25572 Pain in left ankle and joints of left foot: Secondary | ICD-10-CM | POA: Diagnosis not present

## 2017-12-27 DIAGNOSIS — M19072 Primary osteoarthritis, left ankle and foot: Secondary | ICD-10-CM | POA: Diagnosis not present

## 2017-12-27 DIAGNOSIS — M79672 Pain in left foot: Secondary | ICD-10-CM | POA: Insufficient documentation

## 2017-12-27 DIAGNOSIS — G8929 Other chronic pain: Secondary | ICD-10-CM | POA: Diagnosis not present

## 2017-12-27 DIAGNOSIS — M7661 Achilles tendinitis, right leg: Secondary | ICD-10-CM | POA: Diagnosis not present

## 2017-12-27 DIAGNOSIS — Z79899 Other long term (current) drug therapy: Secondary | ICD-10-CM

## 2017-12-27 DIAGNOSIS — M7660 Achilles tendinitis, unspecified leg: Secondary | ICD-10-CM | POA: Diagnosis not present

## 2017-12-27 DIAGNOSIS — M25571 Pain in right ankle and joints of right foot: Secondary | ICD-10-CM | POA: Diagnosis not present

## 2017-12-27 DIAGNOSIS — Z5181 Encounter for therapeutic drug level monitoring: Secondary | ICD-10-CM

## 2017-12-27 DIAGNOSIS — M79671 Pain in right foot: Secondary | ICD-10-CM | POA: Insufficient documentation

## 2017-12-27 DIAGNOSIS — M19272 Secondary osteoarthritis, left ankle and foot: Secondary | ICD-10-CM

## 2017-12-27 DIAGNOSIS — M1990 Unspecified osteoarthritis, unspecified site: Secondary | ICD-10-CM | POA: Insufficient documentation

## 2017-12-27 DIAGNOSIS — F419 Anxiety disorder, unspecified: Secondary | ICD-10-CM | POA: Diagnosis not present

## 2017-12-27 MED ORDER — DICLOFENAC SODIUM 50 MG PO TBEC: 50.0000 mg | DELAYED_RELEASE_TABLET | Freq: Two times a day (BID) | ORAL | 5 refills | Status: DC

## 2017-12-27 MED ORDER — OXYCODONE-ACETAMINOPHEN 10-325 MG PO TABS: 1.0000 | ORAL_TABLET | Freq: Three times a day (TID) | ORAL | 0 refills | Status: DC | PRN

## 2017-12-27 NOTE — Progress Notes (Signed)
Subjective:    Patient ID: Brian Rowe, male    DOB: [birth year], 42 y.o.   MRN: [id number]  HPI   Brian Rowe is here in follow up of his chronic pain. He states that his pain levels have been fairly consistent.  He still deals with pain in his proximal left foot the more he is active.  Pain is often worse when he first gets moving in the morning as well.  He did trip a few weeks ago going up some stairs into his house and landed on his right hand.  This seemed to exacerbate some of his carpal tunnel symptoms..  He had questions about why he may have ongoing tingling in his hand.  He remains on oxycodone 10/325 1 every 8 hours as needed for baseline pain control.  Also using Flexeril as needed and meloxicam 15 mg daily. Pain Inventory Average Pain 7 Pain Right Now 8 My pain is sharp, dull and aching  In the last 24 hours, has pain interfered with the following? General activity 8 Relation with others 8 Enjoyment of life 7 What TIME of day is your pain at its worst? morning Sleep (in general) Poor  Pain is worse with: inactivity, standing and some activites Pain improves with: medication Relief from Meds: 4  Mobility use a cane ability to climb steps?  no do you drive?  yes  Function disabled: date disabled . I need assistance with the following:  shopping  Neuro/Psych numbness depression anxiety  Prior Studies Any changes since last visit?  no  Physicians involved in your care Any changes since last visit?  no   Family History  Problem Relation Age of Onset  . Hypertension Father    Social History   Socioeconomic History  . Marital status: Single    Spouse name: None  . Number of children: None  . Years of education: None  . Highest education level: None  Social Needs  . Financial resource strain: None  . Food insecurity - worry: None  . Food insecurity - inability: None  . Transportation needs - medical: None  . Transportation needs - non-medical:  None  Occupational History  . None  Tobacco Use  . Smoking status: Former Smoker    Last attempt to quit: 12/06/2011    Years since quitting: 6.0  . Smokeless tobacco: Never Used  Substance and Sexual Activity  . Alcohol use: Yes    Alcohol/week: 0.6 oz    Types: 1 Cans of beer per week  . Drug use: No  . Sexual activity: Yes  Other Topics Concern  . None  Social History Narrative  . None   Past Surgical History:  Procedure Laterality Date  . left ankle Left   . NASAL SEPTUM SURGERY    . THROAT SURGERY     Past Medical History:  Diagnosis Date  . ANXIETY 07/19/2007  . DEPRESSION 07/19/2007  . HEMORRHOIDS, INTERNAL 12/01/2008  . HYPERLIPIDEMIA 07/19/2007  . HYPERTENSION 07/19/2007  . Sleep apnea   . VISUAL IMPAIRMENT 10/15/2007   BP (!) 149/104   Pulse 97   SpO2 94%   Opioid Risk Score:   Fall Risk Score:  `1  Depression screen PHQ 2/9  Depression [facility] 12/27/2017 10/31/2017 09/04/2017 04/04/2017 08/29/2016 02/01/2016 03/03/2015  Decreased Interest 1 1 0 3 0 - 1  Down, Depressed, Hopeless 1 1 0 3 0 - 1  PHQ - 2 Score 2 2 0 6 0 - 2  Altered  sleeping - - - - - 1 2  Tired, decreased energy - - - - - 1 2  Change in appetite - - - - - - 1  Feeling bad or failure about yourself  - - - - - - 1  Trouble concentrating - - - - - - 3  Moving slowly or fidgety/restless - - - - - - 0  Suicidal thoughts - - - - - - 0  PHQ-9 Score - - - - - - 11      Review of Systems  Constitutional: Negative.   HENT: Negative.   Eyes: Negative.   Respiratory: Negative.   Cardiovascular: Negative.   Gastrointestinal: Negative.   Endocrine: Negative.   Genitourinary: Negative.   Musculoskeletal: Negative.   Skin: Negative.   Allergic/Immunologic: Negative.   Neurological: Negative.   Hematological: Negative.   Psychiatric/Behavioral: Negative.        Objective:   Physical Exam  General: Alert and oriented x 3, No apparent distress.  Obese and large framed HEENT:Head is  normocephalic, atraumatic, PERRLA, EOMI, sclera anicteric, oral mucosa pink and moist, dentition intact, ext ear canals clear,  Neck:Supple without JVD or lymphadenopathy  Heart:Regular rate Chest:Clear with normal effort Abdomen:Soft, non-tender, non-distended, bowel sounds positive.  Extremities:pulses intact Skin:Clean and intact without signs of breakdown  Neuro:normal motor function. Decreased LT median right hand.  Grip is 5/5, pinch is strong.  Musculoskeletal:Full ROM, No pain with AROM or PROM in the neck, trunk, or upper extremities. he has a high arch on the left foot. Ongoing mild equinovarus deformity left foot. Limited ankle dorsiflexion----about 70-80 degrees dorsiflexion.   He has pain with palpation over the talus bone. Psych:Pt's affect is appropriate. Pt is cooperative. Talkative but very pleasant    Assessment & Plan:  1. Chronic foot and ankle pain, left more than right---hx of multiple foot surgeries as a child due to ?devlopmental disorder. 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  2. Mild achilles tendonitis, ?right calcaneal spur  3. Morbid Obesity  4. Anxiety disorder  5. Recent CTS release    Plan:               1.Continue with HEP 2. Again discussed diet and weight loss at length.  Weight loss would give him the best bet for pain control over the long-term. 3. Appropriate shoe wear important.  4.Continue percocet10/325, one q8 prn #90. Second rx next month..We will continue the opioid monitoring program, this consists of regular clinic visits, examinations, routine drug screening, pill counts as well as use of [place] Controlled Substance Reporting System. [place] today.   5. Change meloxicam to diclofenac. maintain Flexeril to 1 every 8 hours as needed #60 6. Continue gabapentin for leg pain. 7.Follow up with me or NP in about 2 months. 15 minutes of face to face patient care time were spent during this  visit. All questions were encouraged and answered.

## 2017-12-27 NOTE — Patient Instructions (Signed)
CONTINUE TO WORK ON YOUR DIET AND EXERCISE!!! 

## 2018-01-01 LAB — DRUG TOX MONITOR 1 W/CONF, ORAL FLD

## 2018-01-01 LAB — DRUG TOX ALC METAB W/CON, ORAL FLD: Alcohol Metabolite: NEGATIVE ng/mL (ref <25)

## 2018-01-02 ENCOUNTER — Telehealth: Payer: Self-pay | Admitting: *Deleted

## 2018-01-02 NOTE — Telephone Encounter (Signed)
Oral swab drug screen was inconsistent for prescribed medications. There was evidence of taking alprazolam which is prescribed by another provider, but no oxycodone or metabolite was present.  He reported taking medication same day of test, therefore this is an inconsistent result. Of note, both urine drug screens in 2018 did not show positive for oxycodone either.

## 2018-01-03 NOTE — Telephone Encounter (Signed)
noted 

## 2018-01-03 NOTE — Telephone Encounter (Signed)
Based on the information provided, we need to issue him a formal written warning that any further inconsistencies main discharge from the clinic.  His next drug test needs to be a urine specimen.

## 2018-01-26 ENCOUNTER — Other Ambulatory Visit: Payer: Self-pay

## 2018-01-26 ENCOUNTER — Encounter (HOSPITAL_COMMUNITY): Payer: Self-pay

## 2018-01-26 ENCOUNTER — Emergency Department (HOSPITAL_COMMUNITY)
Admission: EM | Admit: 2018-01-26 | Discharge: 2018-01-26 | Disposition: A | Discharge status: Home / Self Care | Payer: BLUE CROSS/BLUE SHIELD | Attending: Emergency Medicine | Admitting: Emergency Medicine

## 2018-01-26 DIAGNOSIS — Z5321 Procedure and treatment not carried out due to patient leaving prior to being seen by health care provider: Secondary | ICD-10-CM | POA: Insufficient documentation

## 2018-01-26 DIAGNOSIS — R079 Chest pain, unspecified: Secondary | ICD-10-CM | POA: Insufficient documentation

## 2018-01-26 LAB — CBC WITH DIFFERENTIAL/PLATELET
Basophils Absolute: 0.1 10*3/uL (ref 0.0–0.1)
Basophils Relative: 1 %
Eosinophils Absolute: 0.1 10*3/uL (ref 0.0–0.7)
Eosinophils Relative: 1 %
HCT: 51.7 % (ref 39.0–52.0)
Hemoglobin: 18.5 g/dL — ABNORMAL HIGH (ref 13.0–17.0)
Lymphocytes Relative: 33 %
Lymphs Abs: 2.7 10*3/uL (ref 0.7–4.0)
MCH: 32.8 pg (ref 26.0–34.0)
MCHC: 35.8 g/dL (ref 30.0–36.0)
MCV: 91.7 fL (ref 78.0–100.0)
Monocytes Absolute: 0.5 10*3/uL (ref 0.1–1.0)
Monocytes Relative: 6 %
Neutro Abs: 4.8 10*3/uL (ref 1.7–7.7)
Neutrophils Relative %: 59 %
Platelets: 207 10*3/uL (ref 150–400)
RBC: 5.64 MIL/uL (ref 4.22–5.81)
RDW: 12.8 % (ref 11.5–15.5)
WBC: 8.1 10*3/uL (ref 4.0–10.5)

## 2018-01-26 LAB — I-STAT TROPONIN, ED: Troponin i, poc: 0 ng/mL (ref 0.00–0.08)

## 2018-01-26 LAB — BASIC METABOLIC PANEL
Anion gap: 11 (ref 5–15)
BUN: 17 mg/dL (ref 6–20)
CO2: 27 mmol/L (ref 22–32)
Calcium: 9.4 mg/dL (ref 8.9–10.3)
Chloride: 101 mmol/L (ref 101–111)
Creatinine, Ser: 1.09 mg/dL (ref 0.61–1.24)
GFR calc Af Amer: >60 mL/min (ref >60)
GFR calc non Af Amer: >60 mL/min (ref >60)
Glucose, Bld: 98 mg/dL (ref 65–99)
Potassium: 4.4 mmol/L (ref 3.5–5.1)
Sodium: 139 mmol/L (ref 135–145)

## 2018-01-26 NOTE — ED Notes (Signed)
Called 3x for vitals recheck. No answer.

## 2018-01-26 NOTE — ED Provider Notes (Signed)
Patient placed in Quick Look pathway, seen and evaluated   Chief Complaint: Chest pain and dry mouth  HPI:   Patient presents to the ED for evaluation of left-sided chest pain that radiates to the left arm and left jaw.  Describes it as a squeezing pain.  Reports associated shortness of breath, nausea but denies any emesis or diaphoresis.  Patient also feels like his throat is very dry and feels tight.  Patient states that he did start a new medication yesterday but he was unsure of what medication this was.  Patient denies any difficulties breathing or swallowing just that his throat feels dry.  Patient denies any palpitations.  Denies any associated vomiting or abdominal pain.  Patient reports being out of his Xanax and his primary care doctor will not refill his medication.  Does report history of anxiety and panic attacks.  Has any known cardiac disease.  ROS: cp, sore throat (one)  Physical Exam:   Gen: No distress  Neuro: Awake and Alert  Skin: Warm    Focused Exam: Oropharynx is clear without any edema.  No angioedema of the tongue.  Patient managing secretions and tolerating his airway.  Heart regular rate and rhythm.  No rubs murmurs or gallops.  Lungs clear to auscultation bilaterally.  Pulses are equal in extremities.  All extremities are warm to touch.  No focal abdominal tenderness.  Speaking in complete sentences.   Initiation of care has begun. The patient has been counseled on the process, plan, and necessity for staying for the completion/evaluation, and the remainder of the medical screening examination   Discussed with the patient that exiting the department prior to completion of the work-up is AMA and there is no guarantee that there are no emergency medical conditions present.    Rise MuLeaphart, Kenneth T, PA-C 01/26/18 1749    Tegeler, Canary Brimhristopher J, MD 02/05/18 [phone]

## 2018-01-26 NOTE — ED Triage Notes (Signed)
Pt presents to the ed with complaints of chest pain in the center of his chest that goes into his left arm and jaw since this morning.  Pt reports that his throat feels tight, states his mouth is just dry. Reports concern for medication reaction. Joselyn Glassmanyler PA at bedside

## 2018-02-14 ENCOUNTER — Encounter: Payer: Self-pay | Admitting: Internal Medicine

## 2018-02-14 ENCOUNTER — Ambulatory Visit (INDEPENDENT_AMBULATORY_CARE_PROVIDER_SITE_OTHER): Payer: BLUE CROSS/BLUE SHIELD | Admitting: Internal Medicine

## 2018-02-14 VITALS — BP 110/80 | HR 81 | Temp 98.4°F | Wt 350.0 lb

## 2018-02-14 DIAGNOSIS — G4733 Obstructive sleep apnea (adult) (pediatric): Secondary | ICD-10-CM

## 2018-02-14 DIAGNOSIS — E785 Hyperlipidemia, unspecified: Secondary | ICD-10-CM

## 2018-02-14 DIAGNOSIS — M19172 Post-traumatic osteoarthritis, left ankle and foot: Secondary | ICD-10-CM

## 2018-02-14 DIAGNOSIS — E039 Hypothyroidism, unspecified: Secondary | ICD-10-CM

## 2018-02-14 DIAGNOSIS — Z9989 Dependence on other enabling machines and devices: Secondary | ICD-10-CM

## 2018-02-14 DIAGNOSIS — I10 Essential (primary) hypertension: Secondary | ICD-10-CM

## 2018-02-14 LAB — LIPID PANEL
Cholesterol: 223 mg/dL — ABNORMAL HIGH (ref 0–200)
HDL: 37.8 mg/dL — ABNORMAL LOW (ref >39.00)
NonHDL: 185.34
Total CHOL/HDL Ratio: 6
Triglycerides: 294 mg/dL — ABNORMAL HIGH (ref 0.0–149.0)
VLDL: 58.8 mg/dL — ABNORMAL HIGH (ref 0.0–40.0)

## 2018-02-14 LAB — LDL CHOLESTEROL, DIRECT: Direct LDL: 160 mg/dL

## 2018-02-14 LAB — TSH: TSH: 4.21 u[IU]/mL (ref 0.35–4.50)

## 2018-02-14 NOTE — Progress Notes (Signed)
Subjective:    Patient ID: Brian Rowe, male    DOB: November 27, 1976, 42 y.o.   MRN: [id number]  HPI 42 year old patient who is seen today for a health maintenance examination.  He has essential hypertension and dyslipidemia.  He is followed by chronic pain management due to low back pain as well as degenerative arthritis involving the left ankle.  Proper foot care stressed.  Patient is wearing sandals today  he has treated hypothyroidism.  TSH several months ago was slightly elevated.  He has been compliant with his medications more recently He has exogenous  obesity With history of OSA on CPAP.  He is followed by psychiatry for anxiety disorder and remains on Prozac and Xanax.  Gabapentin has also been prescribed.  Patient states he also has samples of Lunesta at home.  He is also on Adderall for ADHD   His chronic pain is treated with oxycodone 3 times daily  He was approved for disability last month  due to chronic anxiety and his orthopedic issues  He is scheduled to see dermatology due to some dermatitis involving his scalp.  He has seen urology recently due to some testicular pain and was diagnosed with some bilateral small varicoceles family history Father has hypertension.  Grandfather history of B12 deficiency mother with a history of obesity  Social history married disabled; former [employer]  Recent ED visit due to a panic attack with normal EKG and troponin level.  No recurrent issues  Past Medical History:  Diagnosis Date  . ANXIETY 07/19/2007  . DEPRESSION 07/19/2007  . HEMORRHOIDS, INTERNAL 12/01/2008  . HYPERLIPIDEMIA 07/19/2007  . HYPERTENSION 07/19/2007  . Sleep apnea   . VISUAL IMPAIRMENT 10/15/2007     Social History   Socioeconomic History  . Marital status: Single    Spouse name: Not on file  . Number of children: Not on file  . Years of education: Not on file  . Highest education level: Not on file  Social Needs  . Financial resource  strain: Not on file  . Food insecurity - worry: Not on file  . Food insecurity - inability: Not on file  . Transportation needs - medical: Not on file  . Transportation needs - non-medical: Not on file  Occupational History  . Not on file  Tobacco Use  . Smoking status: Former Smoker    Last attempt to quit: 12/06/2011    Years since quitting: 6.1  . Smokeless tobacco: Never Used  Substance and Sexual Activity  . Alcohol use: Yes    Alcohol/week: 0.6 oz    Types: 1 Cans of beer per week  . Drug use: No  . Sexual activity: Yes  Other Topics Concern  . Not on file  Social History Narrative  . Not on file    Past Surgical History:  Procedure Laterality Date  . left ankle Left   . NASAL SEPTUM SURGERY    . THROAT SURGERY      Family History  Problem Relation Age of Onset  . Hypertension Father     Allergies  Allergen Reactions  . Tramadol     ?serotonin syndrome    Current Outpatient Medications on File Prior to Visit  Medication Sig Dispense Refill  . alprazolam (XANAX) 2 MG tablet Take 2 mg by mouth 3 (three) times daily as needed for sleep. Prescribed by Dr.Kaur    . amphetamine-dextroamphetamine (ADDERALL) 30 MG tablet Take 30 mg by mouth 3 (three) times daily. Prescribed by  Dr. Evelene CroonKaur    . cyclobenzaprine (FLEXERIL) 10 MG tablet Take 1 tablet (10 mg total) by mouth 3 (three) times daily as needed for muscle spasms. 60 tablet 3  . diclofenac (VOLTAREN) 50 MG EC tablet Take 1 tablet (50 mg total) by mouth 2 (two) times daily with a meal. 60 tablet 5  . FLUoxetine (PROZAC) 40 MG capsule TK ONE C PO QD  2  . gabapentin (NEURONTIN) 600 MG tablet Take one in am, one midday, and 4 at hs for total of 3600 mg    . levothyroxine (SYNTHROID, LEVOTHROID) 100 MCG tablet Take 1 tablet (100 mcg total) by mouth daily. 30 tablet 1  . oxyCODONE-acetaminophen (PERCOCET) 10-325 MG tablet Take 1 tablet by mouth every 8 (eight) hours as needed for pain. 90 tablet 0  . valACYclovir  (VALTREX) 500 MG tablet Take 1 tablet (500 mg total) by mouth 2 (two) times daily. (Patient taking differently: Take 500 mg by mouth 2 (two) times daily as needed (Herpes). ) 20 tablet 1   No current facility-administered medications on file prior to visit.     BP 110/80 (BP Location: Right Arm, Patient Position: Sitting, Cuff Size: Large)   Pulse 81   Temp 98.4 F (36.9 C) (Oral)   Wt (!) 350 lb (158.8 kg)   SpO2 97%   BMI 43.75 kg/m     Review of Systems  Constitutional: Positive for activity change and unexpected weight change. Negative for appetite change, chills, fatigue and fever.  HENT: Negative for congestion, dental problem, ear pain, hearing loss, sore throat, tinnitus, trouble swallowing and voice change.   Eyes: Negative for pain, discharge and visual disturbance.  Respiratory: Negative for cough, chest tightness, wheezing and stridor.   Cardiovascular: Negative for chest pain, palpitations and leg swelling.  Gastrointestinal: Negative for abdominal distention, abdominal pain, blood in stool, constipation, diarrhea, nausea and vomiting.  Genitourinary: Negative for difficulty urinating, discharge, flank pain, genital sores, hematuria and urgency.  Musculoskeletal: Positive for arthralgias and gait problem. Negative for back pain, joint swelling, myalgias and neck stiffness.  Skin: Negative for rash.  Neurological: Negative for dizziness, syncope, speech difficulty, weakness, numbness and headaches.  Hematological: Negative for adenopathy. Does not bruise/bleed easily.  Psychiatric/Behavioral: Positive for sleep disturbance. Negative for behavioral problems and dysphoric mood. The patient is nervous/anxious.        Objective:   Physical Exam  Constitutional: He appears well-developed and well-nourished. No distress.    Weight 350 blood pressure 110/80  HENT:  Head: Normocephalic and atraumatic.  Right Ear: External ear normal.  Left Ear: External ear normal.    Nose: Nose normal.  Mouth/Throat: Oropharynx is clear and moist.  Status post uvuloplasty  Eyes: Conjunctivae and EOM are normal. Pupils are equal, round, and reactive to light. No scleral icterus.  Neck: Normal range of motion. Neck supple. No JVD present. No thyromegaly present.  Cardiovascular: Regular rhythm, normal heart sounds and intact distal pulses. Exam reveals no gallop and no friction rub.  No murmur heard. Pulmonary/Chest: Effort normal and breath sounds normal. He exhibits no tenderness.  Abdominal: Soft. Bowel sounds are normal. He exhibits no distension and no mass. There is no tenderness.  Genitourinary: Prostate normal and penis normal.  Musculoskeletal: Normal range of motion. He exhibits no edema or tenderness.  Surgical scar anterior left ankle  Lymphadenopathy:    He has no cervical adenopathy.  Neurological: He is alert. He has normal reflexes. No cranial nerve deficit. Coordination normal.  Skin:  Skin is warm and dry. No rash noted.  Patchy dry flaky  scalp dermatitis  Psychiatric: He has a normal mood and affect. His behavior is normal.          Assessment & Plan:   Essential hypertension controlled Morbid obesity.  Patient is agreeable for dietary referral  chronic pain.  Follow-up chronic pain management  Chronic anxiety disorder follow-up psychiatry  Hypothyroidism.  Will review a TSH ADHD follow-up psychiatry   Rogelia Boga

## 2018-02-14 NOTE — Progress Notes (Deleted)
   Subjective:    Patient ID: Brian Rowe, male    DOB: 1976-04-04, 42 y.o.   MRN: [id number]  HPI  Wt Readings from Last 3 Encounters:  02/14/18 (!) 350 lb (158.8 kg)  01/26/18 (!) 351 lb (159.2 kg)  08/29/16 (!) 322 lb 8 oz (146.3 kg)    Review of Systems     Objective:   Physical Exam        Assessment & Plan:

## 2018-02-14 NOTE — Patient Instructions (Signed)
Limit your sodium (Salt) intake  Please check your blood pressure on a regular basis.  If it is consistently greater than 140/90, please make an office appointment.    It is important that you exercise regularly, at least 20 minutes 3 to 4 times per week.  If you develop chest pain or shortness of breath seek  medical attention.  You need to lose weight.  Consider a lower calorie diet and regular exercise.  Dietary consultation as discussed

## 2018-02-20 ENCOUNTER — Telehealth: Payer: Self-pay | Admitting: Internal Medicine

## 2018-02-20 NOTE — Telephone Encounter (Signed)
Please advise sir. 

## 2018-02-20 NOTE — Telephone Encounter (Signed)
Please call/notify patient that lab/test/procedure is normal.  Cholesterol is 223 which is the same as last year

## 2018-02-20 NOTE — Telephone Encounter (Signed)
Copied from CRM [id number]. Topic: Quick Communication - Lab Results >> Feb 20, 2018 11:05 AM Brian Rowe, Rosey Batheresa D wrote: Patient called and would like to know his lab results from last week. Please call patient back, thanks.

## 2018-02-22 ENCOUNTER — Encounter: Payer: Self-pay | Admitting: Internal Medicine

## 2018-02-23 ENCOUNTER — Encounter: Payer: BLUE CROSS/BLUE SHIELD | Attending: Physical Medicine & Rehabilitation | Admitting: Registered Nurse

## 2018-02-23 ENCOUNTER — Encounter: Payer: Self-pay | Admitting: Registered Nurse

## 2018-02-23 ENCOUNTER — Other Ambulatory Visit: Payer: Self-pay

## 2018-02-23 VITALS — BP 145/92 | HR 85

## 2018-02-23 DIAGNOSIS — Z79899 Other long term (current) drug therapy: Secondary | ICD-10-CM | POA: Diagnosis not present

## 2018-02-23 DIAGNOSIS — M25572 Pain in left ankle and joints of left foot: Secondary | ICD-10-CM | POA: Insufficient documentation

## 2018-02-23 DIAGNOSIS — M19272 Secondary osteoarthritis, left ankle and foot: Secondary | ICD-10-CM

## 2018-02-23 DIAGNOSIS — Z5181 Encounter for therapeutic drug level monitoring: Secondary | ICD-10-CM | POA: Diagnosis not present

## 2018-02-23 DIAGNOSIS — M79671 Pain in right foot: Secondary | ICD-10-CM | POA: Insufficient documentation

## 2018-02-23 DIAGNOSIS — G894 Chronic pain syndrome: Secondary | ICD-10-CM | POA: Diagnosis not present

## 2018-02-23 DIAGNOSIS — M7661 Achilles tendinitis, right leg: Secondary | ICD-10-CM | POA: Diagnosis not present

## 2018-02-23 DIAGNOSIS — M19072 Primary osteoarthritis, left ankle and foot: Secondary | ICD-10-CM

## 2018-02-23 DIAGNOSIS — M7660 Achilles tendinitis, unspecified leg: Secondary | ICD-10-CM | POA: Diagnosis not present

## 2018-02-23 DIAGNOSIS — G609 Hereditary and idiopathic neuropathy, unspecified: Secondary | ICD-10-CM

## 2018-02-23 DIAGNOSIS — M25571 Pain in right ankle and joints of right foot: Secondary | ICD-10-CM | POA: Insufficient documentation

## 2018-02-23 DIAGNOSIS — F419 Anxiety disorder, unspecified: Secondary | ICD-10-CM | POA: Insufficient documentation

## 2018-02-23 DIAGNOSIS — G8929 Other chronic pain: Secondary | ICD-10-CM | POA: Diagnosis not present

## 2018-02-23 DIAGNOSIS — M1990 Unspecified osteoarthritis, unspecified site: Secondary | ICD-10-CM | POA: Diagnosis not present

## 2018-02-23 DIAGNOSIS — Z79891 Long term (current) use of opiate analgesic: Secondary | ICD-10-CM | POA: Diagnosis not present

## 2018-02-23 DIAGNOSIS — M79672 Pain in left foot: Secondary | ICD-10-CM | POA: Diagnosis not present

## 2018-02-23 MED ORDER — OXYCODONE-ACETAMINOPHEN 10-325 MG PO TABS: 1.0000 | ORAL_TABLET | Freq: Three times a day (TID) | ORAL | 0 refills | Status: DC | PRN

## 2018-02-23 NOTE — Progress Notes (Signed)
Subjective:    Patient ID: Brian Rowe, male    DOB: December 20, 1975, 42 y.o.   MRN: [id number]  HPI: [name] Brian Rowe is a 42year old male who returns for follow up appointment and medication refill. He states his pain is located in his left ankle. He rates his pain 7. His current exercise regime is walking.   Mr. [name] Morphine equivalent is 46.50 MME. He is also prescribed Xanax and Adderall by Dr. Evelene CroonKaur.We have reviewed the black box warning of using opioids and benzodiazepines. He is being closely monitored and under the care of his psychiatrist Dr. Evelene CroonKaur.  UDS ordered today.   Pain Inventory Average Pain 5 Pain Right Now 7 My pain is sharp, burning, dull, stabbing, tingling and aching  In the last 24 hours, has pain interfered with the following? General activity 2 Relation with others 2 Enjoyment of life 3 What TIME of day is your pain at its worst? morning Sleep (in general) Fair  Pain is worse with: walking, inactivity, standing and some activites Pain improves with: rest, therapy/exercise, medication and injections Relief from Meds: 5  Mobility use a cane  Function disabled: date disabled 01/2013 I need assistance with the following:  meal prep, household duties and shopping  Neuro/Psych numbness trouble walking depression anxiety  Prior Studies Any changes since last visit?  no  Physicians involved in your care    Family History  Problem Relation Age of Onset  . Hypertension Father    Social History   Socioeconomic History  . Marital status: Single    Spouse name: Not on file  . Number of children: Not on file  . Years of education: Not on file  . Highest education level: Not on file  Occupational History  . Not on file  Social Needs  . Financial resource strain: Not on file  . Food insecurity:    Worry: Not on file    Inability: Not on file  . Transportation needs:    Medical: Not on file    Non-medical: Not on file  Tobacco Use  .  Smoking status: Former Smoker    Last attempt to quit: 12/06/2011    Years since quitting: 6.2  . Smokeless tobacco: Never Used  Substance and Sexual Activity  . Alcohol use: Yes    Alcohol/week: 0.6 oz    Types: 1 Cans of beer per week  . Drug use: No  . Sexual activity: Yes  Lifestyle  . Physical activity:    Days per week: Not on file    Minutes per session: Not on file  . Stress: Not on file  Relationships  . Social connections:    Talks on phone: Not on file    Gets together: Not on file    Attends religious service: Not on file    Active member of club or organization: Not on file    Attends meetings of clubs or organizations: Not on file    Relationship status: Not on file  Other Topics Concern  . Not on file  Social History Narrative  . Not on file   Past Surgical History:  Procedure Laterality Date  . left ankle Left   . NASAL SEPTUM SURGERY    . THROAT SURGERY     Past Medical History:  Diagnosis Date  . ANXIETY 07/19/2007  . DEPRESSION 07/19/2007  . HEMORRHOIDS, INTERNAL 12/01/2008  . HYPERLIPIDEMIA 07/19/2007  . HYPERTENSION 07/19/2007  . Sleep apnea   . VISUAL  IMPAIRMENT 10/15/2007   BP (!) 145/92   Pulse 85   SpO2 96%   Opioid Risk Score:   Fall Risk Score:  `1  Depression screen PHQ 2/9  Depression screen [facility] 2/9 02/23/2018 12/27/2017 10/31/2017 09/04/2017 04/04/2017 08/29/2016 02/01/2016  Decreased Interest 1 1 1  0 3 0 -  Down, Depressed, Hopeless 1 1 1  0 3 0 -  PHQ - 2 Score 2 2 2  0 6 0 -  Altered sleeping - - - - - - 1  Tired, decreased energy - - - - - - 1  Change in appetite - - - - - - -  Feeling bad or failure about yourself  - - - - - - -  Trouble concentrating - - - - - - -  Moving slowly or fidgety/restless - - - - - - -  Suicidal thoughts - - - - - - -  PHQ-9 Score - - - - - - -     Review of Systems  Constitutional: Negative.   HENT: Negative.   Eyes: Negative.   Respiratory: Negative.   Cardiovascular: Negative.     Gastrointestinal: Negative.   Endocrine: Negative.   Genitourinary: Negative.   Musculoskeletal: Negative.        Ankle pain /wrist numbness  Skin: Negative.   Allergic/Immunologic: Negative.   Neurological: Negative.   Hematological: Negative.   Psychiatric/Behavioral: Negative.   All other systems reviewed and are negative.      Objective:   Physical Exam  Constitutional: He is oriented to person, place, and time. He appears well-developed and well-nourished.  HENT:  Head: Normocephalic and atraumatic.  Neck: Normal range of motion. Neck supple.  Cardiovascular: Normal rate and regular rhythm.  Pulmonary/Chest: Effort normal and breath sounds normal.  Musculoskeletal:  Normal Muscle Bulk and Muscle Testing Reveals: Upper Extremities: Full ROM and Muscle Strength 5/5 Lower Extremities: Full ROM and Muscle Strength 5/5 Left Lower Extremity Flexion Produces Pain into left foot Arises from Table with ease Narrow Based Gait   Neurological: He is alert and oriented to person, place, and time.  Skin: Skin is warm and dry.  Psychiatric: He has a normal mood and affect.  Nursing note and vitals reviewed.         Assessment & Plan:  1. Chronic foot and ankle pain, left more than right: hx of multiple foot surgeries as a child due to ?devlopmental disorder. Continue with Daily stretches. 02/23/2018 Refilled: Percocet 10/325 mg one tablet every 8 hours as needed #90. Second script e-scribe for the following month. 02/23/2018 We will continue the opioid monitoring program, this consists of regular clinic visits, examinations, urine drug screen, pill counts as well as use of [place] controlled substance reporting system. 2. Mild achilles tendonitis/right calcaneal spur: Follow up with Dr. Victorino Dike For left ankle/foot fusion. On hold due to financial hardship. 02/23/2018 3. Morbid Obesity : Continue to Live Healthy Diet and Exercise regime. 02/23/2018 4. Anxiety disorder:  Continue Xanax. Psychiatry Following.02/23/2018 5. Neuropathy: Continue Gabapentin. 02/23/2018  20 minutes of face to face patient care time was spent during this visit. All questions were encouraged and answered.  F/U in 1 month

## 2018-02-26 ENCOUNTER — Other Ambulatory Visit: Payer: Self-pay | Admitting: Internal Medicine

## 2018-02-26 MED ORDER — LEVOTHYROXINE SODIUM 112 MCG PO TABS: 112.0000 ug | ORAL_TABLET | Freq: Every day | ORAL | 4 refills | Status: DC

## 2018-03-02 LAB — TOXASSURE SELECT,+ANTIDEPR,UR

## 2018-03-06 ENCOUNTER — Telehealth: Payer: Self-pay | Admitting: *Deleted

## 2018-03-06 NOTE — Telephone Encounter (Signed)
Urine drug screen is negative for his oxycodone.  This is the 4th consecutive urine test that is negative for his narcotic and 6th overall that has been negative.  The 01/02/18 message about issuing a warning was closed by CMA and did not get to my inbox, so a warning letter was not issued. Please advise.

## 2018-03-07 NOTE — Telephone Encounter (Signed)
Per Dr [name], Mr Brian Rowe will be issued a formal and final warning letter, and be moved to monthly visits.  Letter mailed and sent through myChart.

## 2018-03-12 MED ORDER — LEVOTHYROXINE SODIUM 112 MCG PO TABS
112.0000 ug | ORAL_TABLET | Freq: Every day | ORAL | 3 refills | Status: DC
Start: 2018-03-12 — End: 2019-01-04

## 2018-03-26 ENCOUNTER — Other Ambulatory Visit: Payer: Self-pay

## 2018-03-26 NOTE — Telephone Encounter (Signed)
Pt called stating that [facility] on [facility] and [facility] but [facility]one could not be transferred and [facility]  Called pt back to let him know that he needs an appt to get a refill. No answer and mailbox full.

## 2018-03-26 NOTE — Telephone Encounter (Signed)
Mr Brian Rowe did call back and spoke with Brian Rowe.  He will see Brian Rowe 04/09/18.  He is informed of required monthly visits for Rx refill.

## 2018-04-09 ENCOUNTER — Encounter: Payer: Self-pay | Admitting: Registered Nurse

## 2018-04-09 ENCOUNTER — Other Ambulatory Visit: Payer: Self-pay

## 2018-04-09 ENCOUNTER — Encounter: Payer: BLUE CROSS/BLUE SHIELD | Attending: Physical Medicine & Rehabilitation | Admitting: Registered Nurse

## 2018-04-09 VITALS — BP 144/91 | HR 99 | Ht 74.0 in | Wt 363.6 lb

## 2018-04-09 DIAGNOSIS — M79671 Pain in right foot: Secondary | ICD-10-CM | POA: Insufficient documentation

## 2018-04-09 DIAGNOSIS — Z79899 Other long term (current) drug therapy: Secondary | ICD-10-CM | POA: Diagnosis not present

## 2018-04-09 DIAGNOSIS — M79672 Pain in left foot: Secondary | ICD-10-CM | POA: Diagnosis not present

## 2018-04-09 DIAGNOSIS — F419 Anxiety disorder, unspecified: Secondary | ICD-10-CM | POA: Diagnosis not present

## 2018-04-09 DIAGNOSIS — M7660 Achilles tendinitis, unspecified leg: Secondary | ICD-10-CM | POA: Insufficient documentation

## 2018-04-09 DIAGNOSIS — Z5181 Encounter for therapeutic drug level monitoring: Secondary | ICD-10-CM

## 2018-04-09 DIAGNOSIS — M7661 Achilles tendinitis, right leg: Secondary | ICD-10-CM | POA: Diagnosis not present

## 2018-04-09 DIAGNOSIS — G894 Chronic pain syndrome: Secondary | ICD-10-CM | POA: Diagnosis not present

## 2018-04-09 DIAGNOSIS — M1990 Unspecified osteoarthritis, unspecified site: Secondary | ICD-10-CM | POA: Insufficient documentation

## 2018-04-09 DIAGNOSIS — G609 Hereditary and idiopathic neuropathy, unspecified: Secondary | ICD-10-CM

## 2018-04-09 DIAGNOSIS — M25571 Pain in right ankle and joints of right foot: Secondary | ICD-10-CM | POA: Diagnosis not present

## 2018-04-09 DIAGNOSIS — M19272 Secondary osteoarthritis, left ankle and foot: Secondary | ICD-10-CM | POA: Diagnosis not present

## 2018-04-09 DIAGNOSIS — M25572 Pain in left ankle and joints of left foot: Secondary | ICD-10-CM | POA: Diagnosis not present

## 2018-04-09 DIAGNOSIS — G8929 Other chronic pain: Secondary | ICD-10-CM | POA: Insufficient documentation

## 2018-04-09 MED ORDER — OXYCODONE-ACETAMINOPHEN 10-325 MG PO TABS: 1.0000 | ORAL_TABLET | Freq: Three times a day (TID) | ORAL | 0 refills | Status: DC | PRN

## 2018-04-09 NOTE — Progress Notes (Signed)
Subjective:    Patient ID: Brian Rowe, male    DOB: [birth year], 42 y.o.   MRN: [id number]  HPI: [name] is a 42 year old male who has return for follow up appointment for chronic pain and medication refill. He states his pain is located in his left ankle and right heel. He rates his pain 9. His current exercise regime is walking.   [name] Morphine Equivalent is 22.50 MME. He is also prescribed Alprazolam  by Dr. Evelene Croon .We have discussed the black box warning of using opioids and benzodiazepines. I highlighted the dangers of using these drugs together and discussed the adverse events including respiratory suppression, overdose, cognitive impairment and importance of compliance with current regimen. We will continue to monitor and adjust as indicated.  He  is being closely monitored and under the care of his psychiatrist Dr. Evelene Croon.Marland Kitchen   Spoke with [name] in detail, regarding his UDS from 02/23/2018, see notes for details.    Pain Inventory Average Pain 9 Pain Right Now 9 My pain is sharp, dull, stabbing, tingling and aching  In the last 24 hours, has pain interfered with the following? General activity 1 Relation with others 1 Enjoyment of life 1 What TIME of day is your pain at its worst? morning Sleep (in general) Fair  Pain is worse with: walking, inactivity, standing and some activites Pain improves with: rest, medication, TENS and injections Relief from Meds: n/a  Mobility use a cane how many minutes can you walk? 10 ability to climb steps?  no do you drive?  yes  Function disabled: date disabled 01/2013 I need assistance with the following:  meal prep and household duties  Neuro/Psych depression anxiety  Prior Studies Any changes since last visit?  yes bone scan x-rays CT/MRI nerve study  Physicians involved in your care Any changes since last visit?  yes Primary care Dr. Kirtland Rowe Psychiatrist Dr. Leeanne Rowe n/a   Family History    Problem Relation Age of Onset  . Hypertension Father    Social History   Socioeconomic History  . Marital status: Single    Spouse name: Not on file  . Number of children: Not on file  . Years of education: Not on file  . Highest education level: Not on file  Occupational History  . Not on file  Social Needs  . Financial resource strain: Not on file  . Food insecurity:    Worry: Not on file    Inability: Not on file  . Transportation needs:    Medical: Not on file    Non-medical: Not on file  Tobacco Use  . Smoking status: Former Smoker    Last attempt to quit: 12/06/2011    Years since quitting: 6.3  . Smokeless tobacco: Never Used  Substance and Sexual Activity  . Alcohol use: Yes    Alcohol/week: 0.6 oz    Types: 1 Cans of beer per week  . Drug use: No  . Sexual activity: Yes  Lifestyle  . Physical activity:    Days per week: Not on file    Minutes per session: Not on file  . Stress: Not on file  Relationships  . Social connections:    Talks on phone: Not on file    Gets together: Not on file    Attends religious service: Not on file    Active member of club or organization: Not on file    Attends meetings of clubs or  organizations: Not on file    Relationship status: Not on file  Other Topics Concern  . Not on file  Social History Narrative  . Not on file   Past Surgical History:  Procedure Laterality Date  . left ankle Left   . NASAL SEPTUM SURGERY    . THROAT SURGERY     Past Medical History:  Diagnosis Date  . ANXIETY 07/19/2007  . DEPRESSION 07/19/2007  . HEMORRHOIDS, INTERNAL 12/01/2008  . HYPERLIPIDEMIA 07/19/2007  . HYPERTENSION 07/19/2007  . Sleep apnea   . VISUAL IMPAIRMENT 10/15/2007   BP (!) 144/91   Pulse 99   Ht  (1.88 m)   Wt (!) 363 lb 9.6 oz (164.9 kg)   SpO2 93%   BMI 46.68 kg/m   Opioid Risk Score:   Fall Risk Score:  `1  Depression screen PHQ 2/9  Depression screen Brian Rowe 2/9 04/09/2018 02/23/2018 12/27/2017 10/31/2017  09/04/2017 04/04/2017 08/29/2016  Decreased Interest 0 0 3 0  Down, Depressed, Hopeless 0 0 3 0  PHQ - 2 Score 0 0 6 0  Altered sleeping - - - - - - -  Tired, decreased energy - - - - - - -  Change in appetite - - - - - - -  Feeling bad or failure about yourself  - - - - - - -  Trouble concentrating - - - - - - -  Moving slowly or fidgety/restless - - - - - - -  Suicidal thoughts - - - - - - -  PHQ-9 Score - - - - - - -    Review of Systems  Constitutional: Negative.   HENT: Negative.   Eyes: Negative.   Respiratory: Negative.   Cardiovascular: Negative.   Gastrointestinal: Negative.   Endocrine: Negative.   Genitourinary: Negative.   Musculoskeletal: Negative.   Skin: Negative.   Allergic/Immunologic: Negative.   Neurological: Negative.   Hematological: Negative.   Psychiatric/Behavioral: Negative.   All other systems reviewed and are negative.      Objective:   Physical Exam  Constitutional: He is oriented to person, place, and time. He appears well-developed and well-nourished.  HENT:  Head: Normocephalic and atraumatic.  Neck: Normal range of motion. Neck supple.  Cardiovascular: Normal rate and regular rhythm.  Pulmonary/Chest: Effort normal and breath sounds normal.  Musculoskeletal:  Normal Muscle Bulk and Muscle Testing Reveals: Upper Extremities: Full ROM and Muscle Strength 5/5 Lower Extremities: Full ROM and Muscle Strength 5/5 Left Lower Extremity Flexion Produces Pain into Left Ankle Arises from Table with Ease Narrow Based Gait  Neurological: He is alert and oriented to person, place, and time.  Skin: Skin is warm and dry.  Psychiatric: He has a normal mood and affect.  Nursing note and vitals reviewed.         Assessment & Plan:  1. Chronic foot and ankle pain, left more than right: hx of multiple foot surgeries as a child due to ?devlopmental disorder. Continue with Daily stretches. 04/09/2018 Refilled: Percocet 10/325 mg one  tablet every 8 hours as needed #90. 04/09/2018 We will continue the opioid monitoring program, this consists of regular clinic visits, examinations, urine drug screen, pill counts as well as use of [place] controlled substance reporting system. 2. Mild achilles tendonitis/right calcaneal spur: Follow up with Dr. Victorino Dike For left ankle/foot fusion. On hold due to financial hardship. 04/09/2018 3. Morbid Obesity : Continue with Healthy Diet and  Exercise regime. 04/09/2018 4. Anxiety disorder: Continue Xanax. Psychiatry Following.04/09/2018 5. Neuropathy: Continue with current medication regimen with Gabapentin. 04/09/2018  20 minutes of face to face patient care time was spent during this visit. All questions were encouraged and answered.  F/U in 1 month

## 2018-04-12 ENCOUNTER — Other Ambulatory Visit: Payer: Self-pay | Admitting: Internal Medicine

## 2018-04-20 ENCOUNTER — Ambulatory Visit: Payer: Medicare Other | Admitting: Registered Nurse

## 2018-05-08 ENCOUNTER — Telehealth: Payer: Self-pay

## 2018-05-08 DIAGNOSIS — M19272 Secondary osteoarthritis, left ankle and foot: Secondary | ICD-10-CM

## 2018-05-08 DIAGNOSIS — M7661 Achilles tendinitis, right leg: Secondary | ICD-10-CM

## 2018-05-08 NOTE — Telephone Encounter (Signed)
Patient called to remind eunice to please refill his oxycodone medication after you return.

## 2018-05-09 MED ORDER — OXYCODONE-ACETAMINOPHEN 10-325 MG PO TABS: 1.0000 | ORAL_TABLET | Freq: Three times a day (TID) | ORAL | 0 refills | Status: DC | PRN

## 2018-05-09 NOTE — Telephone Encounter (Signed)
According to the PMP Aware Web-site last Oxycodone prescription was picked up on 04/09/2018. Oxycodone prescription refilled, next scheduled appointment on 05/22/2018. Placed a call to [name] regarding the above.

## 2018-05-22 ENCOUNTER — Encounter: Payer: BLUE CROSS/BLUE SHIELD | Admitting: Registered Nurse

## 2018-05-24 ENCOUNTER — Encounter: Payer: Self-pay | Admitting: Registered Nurse

## 2018-05-28 ENCOUNTER — Encounter: Payer: Self-pay | Admitting: Physical Medicine & Rehabilitation

## 2018-05-28 ENCOUNTER — Encounter: Payer: BLUE CROSS/BLUE SHIELD | Admitting: Registered Nurse

## 2018-05-28 ENCOUNTER — Encounter
Payer: BLUE CROSS/BLUE SHIELD | Attending: Physical Medicine & Rehabilitation | Admitting: Physical Medicine & Rehabilitation

## 2018-05-28 VITALS — BP 132/92 | HR 82 | Ht 75.0 in | Wt 361.0 lb

## 2018-05-28 DIAGNOSIS — M79672 Pain in left foot: Secondary | ICD-10-CM | POA: Insufficient documentation

## 2018-05-28 DIAGNOSIS — M19272 Secondary osteoarthritis, left ankle and foot: Secondary | ICD-10-CM

## 2018-05-28 DIAGNOSIS — M7660 Achilles tendinitis, unspecified leg: Secondary | ICD-10-CM | POA: Diagnosis not present

## 2018-05-28 DIAGNOSIS — M19172 Post-traumatic osteoarthritis, left ankle and foot: Secondary | ICD-10-CM

## 2018-05-28 DIAGNOSIS — M25571 Pain in right ankle and joints of right foot: Secondary | ICD-10-CM | POA: Diagnosis not present

## 2018-05-28 DIAGNOSIS — M7661 Achilles tendinitis, right leg: Secondary | ICD-10-CM | POA: Diagnosis not present

## 2018-05-28 DIAGNOSIS — M1990 Unspecified osteoarthritis, unspecified site: Secondary | ICD-10-CM | POA: Insufficient documentation

## 2018-05-28 DIAGNOSIS — M79671 Pain in right foot: Secondary | ICD-10-CM | POA: Diagnosis not present

## 2018-05-28 DIAGNOSIS — G8929 Other chronic pain: Secondary | ICD-10-CM | POA: Insufficient documentation

## 2018-05-28 DIAGNOSIS — M25572 Pain in left ankle and joints of left foot: Secondary | ICD-10-CM | POA: Diagnosis not present

## 2018-05-28 DIAGNOSIS — F419 Anxiety disorder, unspecified: Secondary | ICD-10-CM | POA: Diagnosis not present

## 2018-05-28 NOTE — Patient Instructions (Signed)
PLEASE FEEL FREE TO CALL OUR OFFICE WITH ANY PROBLEMS OR QUESTIONS ([phone])      

## 2018-05-28 NOTE — Progress Notes (Signed)
Subjective:    Patient ID: Brian Rowe, male    DOB: 1976/10/19, 42 y.o.   MRN: [id number]  HPI   Brian Rowe is here in follow up of his chronic pain.  He disclosed to us recently that his wife has been taking his his Percocets and changing them out for similarly shaped and numbered Tylenol.  Apparently she has been doing this for some time and this accounts for his lack of oxycodone and numerous drug screens.  His wife has now sought formal help.  He had never thought to lock up his medications in the past.  Pain levels have been fairly consistent particularly in his feet and ankles.  He does still take diclofenac as well as Flexeril and gabapentin for pain control.  Pain Inventory Average Pain 8 Pain Right Now 8 My pain is sharp, dull, stabbing and aching  In the last 24 hours, has pain interfered with the following? General activity 2 Relation with others 1 Enjoyment of life 2 What TIME of day is your pain at its worst? evening Sleep (in general) Fair  Pain is worse with: walking, inactivity, standing and some activites Pain improves with: rest Relief from Meds: 2  Mobility use a cane  Function disabled: date disabled 2019 Do you have any goals in this area?  yes  Neuro/Psych depression anxiety  Prior Studies Any changes since last visit?  no  Physicians involved in your care Any changes since last visit?  no   Family History  Problem Relation Age of Onset  . Hypertension Father    Social History   Socioeconomic History  . Marital status: Single    Spouse name: Not on file  . Number of children: Not on file  . Years of education: Not on file  . Highest education level: Not on file  Occupational History  . Not on file  Social Needs  . Financial resource strain: Not on file  . Food insecurity:    Worry: Not on file    Inability: Not on file  . Transportation needs:    Medical: Not on file    Non-medical: Not on file  Tobacco Use  . Smoking status:  Former Smoker    Last attempt to quit: 12/06/2011    Years since quitting: 6.4  . Smokeless tobacco: Never Used  Substance and Sexual Activity  . Alcohol use: Yes    Alcohol/week: 0.6 oz    Types: 1 Cans of beer per week  . Drug use: No  . Sexual activity: Yes  Lifestyle  . Physical activity:    Days per week: Not on file    Minutes per session: Not on file  . Stress: Not on file  Relationships  . Social connections:    Talks on phone: Not on file    Gets together: Not on file    Attends religious service: Not on file    Active member of club or organization: Not on file    Attends meetings of clubs or organizations: Not on file    Relationship status: Not on file  Other Topics Concern  . Not on file  Social History Narrative  . Not on file   Past Surgical History:  Procedure Laterality Date  . left ankle Left   . NASAL SEPTUM SURGERY    . THROAT SURGERY     Past Medical History:  Diagnosis Date  . ANXIETY 07/19/2007  . DEPRESSION 07/19/2007  . HEMORRHOIDS, INTERNAL 12/01/2008  .  HYPERLIPIDEMIA 07/19/2007  . HYPERTENSION 07/19/2007  . Sleep apnea   . VISUAL IMPAIRMENT 10/15/2007   There were no vitals taken for this visit.  Opioid Risk Score:   Fall Risk Score:  `1  Depression screen PHQ 2/9  Depression screen [facility] 2/9 04/09/2018 02/23/2018 12/27/2017 10/31/2017 09/04/2017 04/04/2017 08/29/2016  Decreased Interest 0 1 1 1  0 3 0  Down, Depressed, Hopeless 0 1 1 1  0 3 0  PHQ - 2 Score 0 2 2 2  0 6 0  Altered sleeping - - - - - - -  Tired, decreased energy - - - - - - -  Change in appetite - - - - - - -  Feeling bad or failure about yourself  - - - - - - -  Trouble concentrating - - - - - - -  Moving slowly or fidgety/restless - - - - - - -  Suicidal thoughts - - - - - - -  PHQ-9 Score - - - - - - -     Review of Systems  Constitutional: Negative.   HENT: Negative.   Eyes: Negative.   Respiratory: Negative.   Cardiovascular: Negative.   Gastrointestinal: Negative.    Endocrine: Negative.   Genitourinary: Negative.   Musculoskeletal: Positive for arthralgias and myalgias.  Skin: Negative.   Allergic/Immunologic: Negative.   Neurological: Negative.   Hematological: Negative.   Psychiatric/Behavioral: Positive for dysphoric mood. The patient is nervous/anxious.   All other systems reviewed and are negative.      Objective:   Physical Exam General: No acute distress HEENT: EOMI, oral membranes moist Cards: reg rate  Chest: normal effort Abdomen: Soft, NT, ND Skin: dry, intact Extremities: no edema Skin:Clean and intact without signs of breakdown  Neuro:normal motor function. Decreased LT median right hand. Grip is 5/5, pinch is strong.  Musculoskeletal:Full ROM, No pain with AROM or PROM in the neck, trunk, or upper extremities. he has a high arch on the left foot. Ongoing mild equinovarus deformity left foot. Limited ankle dorsiflexion----about 70-80 degrees dorsiflexion.  He has pain with palpation over the talus bone. Psych:Pt's affect is appropriate. Pt is cooperative. Talkative but very pleasant    Assessment & Plan:  1. Chronic foot and ankle pain, left more than right---hx of multiple foot surgeries as a child due to ?devlopmental disorder. 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  2. Mild achilles tendonitis, ?right calcaneal spur  3. Morbid Obesity  4. Anxiety disorder 5. Recent CTS release    Plan:               1. Recommended continuing with home exercise program and weight loss.  Weight loss remains particularly important for his foot and ankle problems 2.Appropriate shoe wear important. 4. I will no longer prescribe him narcotics given the risk factors involved.  Frankly, I think he is better off moving forward not using narcotics again.  5.   Me maintain on diclofenac for anti-inflammatory effects.  Flexeril for spasms 6. Continue gabapentin for leg pain.    7.  We will see him on as-needed  basis moving forward.  Fifteen minutes of face to face patient care time were spent during this visit. All questions were encouraged and answered.

## 2018-05-30 ENCOUNTER — Encounter: Payer: Self-pay | Admitting: Registered Nurse

## 2018-05-30 NOTE — Telephone Encounter (Signed)
Patient called to thank Brian Rowe for everything that you have done for him.  He just wanted to let you know, since he will not be coming back to our clinic for medication.

## 2018-05-30 NOTE — Telephone Encounter (Signed)
Forwarded to JusticeEunice.

## 2018-06-04 ENCOUNTER — Telehealth: Payer: Self-pay | Admitting: Internal Medicine

## 2018-06-04 NOTE — Telephone Encounter (Signed)
Copied from CRM [id number]. Topic: Quick Communication - Rx Refill/Question >> Jun 04, 2018  2:11 PM Percival SpanishKennedy, Cheryl W wrote: Medication   levothyroxine (SYNTHROID, LEVOTHROID) 75 MCG tablet  Pt does not use [facility] and is asking if the med can be sent to    Preferred Pharmacy  [facility]   Agent: Please be advised that RX refills may take up to 3 business days. We ask that you follow-up with your pharmacy.

## 2018-06-05 NOTE — Telephone Encounter (Signed)
Left message for pt to return call regarding Levothyroxine refill. Wanted to see if pt had picked up prescription for Levothyroxine 75 mcg that was sent on 04/13/18 to [facility] even thought pt no longer uses [facility].

## 2018-06-06 NOTE — Telephone Encounter (Signed)
Left detailed message for pt to return phone call. 

## 2018-06-08 NOTE — Telephone Encounter (Signed)
Spoke to pt and he stated that he [facility] and stated that he wants future Rx [facility]. Pt was told that the new Rx will come from new provider. Pt verbalized understanding! No further action needed.

## 2018-07-07 ENCOUNTER — Other Ambulatory Visit: Payer: Self-pay | Admitting: Physical Medicine & Rehabilitation

## 2018-12-21 ENCOUNTER — Other Ambulatory Visit: Payer: Self-pay | Admitting: Internal Medicine

## 2018-12-26 ENCOUNTER — Ambulatory Visit: Payer: Self-pay | Admitting: *Deleted

## 2018-12-26 NOTE — Telephone Encounter (Signed)
Charted in error.

## 2018-12-26 NOTE — Telephone Encounter (Signed)
Patient reporting small cracks in his skin on thumbs and index fingers, both hands. No bleeding/pus reported. No fever/warmth to area/no red streaks/spreading at the cracks. Advice care reviewed including: clean thoroughly daily and dry. Apply OTC lotions mentioned, keep open to air after 24 hours of neosporin, wear gloves in cold weather. Symptoms reviewed that would require immediate evaluation.   Reason for Disposition . Minor cut or scratch  Answer Assessment - Initial Assessment Questions 1. APPEARANCE of INJURY: "What does the injury look like?"      Small cracks from chapped skin on fore fingers and thumbs of both hands 2. SIZE: "How large is the cut?"      Less than 1/2 3. BLEEDING: "Is it bleeding now?" If so, ask: "Is it difficult to stop?"      No significant bleeding or pus noted 4. LOCATION: "Where is the injury located?"      Both hands 5. ONSET: "How long ago did the injury occur?"      Over one week 6. MECHANISM: "Tell me how it happened."      Chapped skin 7. TETANUS: "When was the last tetanus booster?"     na 8. PREGNANCY: "Is there any chance you are pregnant?" "When was your last menstrual period?"     na  Protocols used: SKIN INJURY-A-AH

## 2019-01-04 ENCOUNTER — Other Ambulatory Visit: Payer: Self-pay

## 2019-01-04 MED ORDER — LEVOTHYROXINE SODIUM 112 MCG PO TABS: 112.0000 ug | ORAL_TABLET | Freq: Every day | ORAL | 3 refills | Status: DC

## 2019-01-04 NOTE — Telephone Encounter (Signed)
Last fill 04/13/18 by Dr.K.  Patient has a TOC appointment 01/07/2019.  Ok to fill?

## 2019-01-07 ENCOUNTER — Ambulatory Visit (INDEPENDENT_AMBULATORY_CARE_PROVIDER_SITE_OTHER): Payer: Medicare Other | Admitting: Family Medicine

## 2019-01-07 ENCOUNTER — Encounter: Payer: Self-pay | Admitting: Family Medicine

## 2019-01-07 VITALS — BP 130/80 | HR 95 | Temp 98.9°F | Ht 75.0 in | Wt 332.2 lb

## 2019-01-07 DIAGNOSIS — G4700 Insomnia, unspecified: Secondary | ICD-10-CM | POA: Diagnosis not present

## 2019-01-07 DIAGNOSIS — Z9989 Dependence on other enabling machines and devices: Secondary | ICD-10-CM | POA: Diagnosis not present

## 2019-01-07 DIAGNOSIS — I10 Essential (primary) hypertension: Secondary | ICD-10-CM | POA: Diagnosis not present

## 2019-01-07 DIAGNOSIS — G4733 Obstructive sleep apnea (adult) (pediatric): Secondary | ICD-10-CM | POA: Diagnosis not present

## 2019-01-07 DIAGNOSIS — F988 Other specified behavioral and emotional disorders with onset usually occurring in childhood and adolescence: Secondary | ICD-10-CM

## 2019-01-07 DIAGNOSIS — E039 Hypothyroidism, unspecified: Secondary | ICD-10-CM

## 2019-01-07 DIAGNOSIS — F419 Anxiety disorder, unspecified: Secondary | ICD-10-CM | POA: Diagnosis not present

## 2019-01-07 DIAGNOSIS — E785 Hyperlipidemia, unspecified: Secondary | ICD-10-CM | POA: Diagnosis not present

## 2019-01-07 DIAGNOSIS — L309 Dermatitis, unspecified: Secondary | ICD-10-CM

## 2019-01-07 LAB — LIPID PANEL
Cholesterol: 252 mg/dL — ABNORMAL HIGH (ref 0–200)
HDL: 36.2 mg/dL — ABNORMAL LOW (ref >39.00)
LDL Cholesterol: 183 mg/dL — ABNORMAL HIGH (ref 0–99)
NonHDL: 216.04
Total CHOL/HDL Ratio: 7
Triglycerides: 164 mg/dL — ABNORMAL HIGH (ref 0.0–149.0)
VLDL: 32.8 mg/dL (ref 0.0–40.0)

## 2019-01-07 LAB — COMPREHENSIVE METABOLIC PANEL
ALT: 51 U/L (ref 0–53)
AST: 21 U/L (ref 0–37)
Albumin: 4.8 g/dL (ref 3.5–5.2)
Alkaline Phosphatase: 55 U/L (ref 39–117)
BUN: 11 mg/dL (ref 6–23)
CO2: 29 mEq/L (ref 19–32)
Calcium: 9.7 mg/dL (ref 8.4–10.5)
Chloride: 99 mEq/L (ref 96–112)
Creatinine, Ser: 1.09 mg/dL (ref 0.40–1.50)
GFR: 74.51 mL/min (ref >60.00)
Glucose, Bld: 78 mg/dL (ref 70–99)
Potassium: 4.1 mEq/L (ref 3.5–5.1)
Sodium: 137 mEq/L (ref 135–145)
Total Bilirubin: 1 mg/dL (ref 0.2–1.2)
Total Protein: 8.1 g/dL (ref 6.0–8.3)

## 2019-01-07 LAB — CBC WITH DIFFERENTIAL/PLATELET
Basophils Absolute: 0.1 10*3/uL (ref 0.0–0.1)
Basophils Relative: 1 % (ref 0.0–3.0)
Eosinophils Absolute: 0.1 10*3/uL (ref 0.0–0.7)
Eosinophils Relative: 1.9 % (ref 0.0–5.0)
HCT: 51 % (ref 39.0–52.0)
Hemoglobin: 17.8 g/dL — ABNORMAL HIGH (ref 13.0–17.0)
Lymphocytes Relative: 36.4 % (ref 12.0–46.0)
Lymphs Abs: 2.5 10*3/uL (ref 0.7–4.0)
MCHC: 34.9 g/dL (ref 30.0–36.0)
MCV: 92 fl (ref 78.0–100.0)
Monocytes Absolute: 0.6 10*3/uL (ref 0.1–1.0)
Monocytes Relative: 8.5 % (ref 3.0–12.0)
Neutro Abs: 3.6 10*3/uL (ref 1.4–7.7)
Neutrophils Relative %: 52.2 % (ref 43.0–77.0)
Platelets: 239 10*3/uL (ref 150.0–400.0)
RBC: 5.54 Mil/uL (ref 4.22–5.81)
RDW: 13.6 % (ref 11.5–15.5)
WBC: 6.8 10*3/uL (ref 4.0–10.5)

## 2019-01-07 LAB — TSH: TSH: 2.2 u[IU]/mL (ref 0.35–4.50)

## 2019-01-07 MED ORDER — TRIAMCINOLONE ACETONIDE 0.5 % EX OINT: 1.0000 "application " | TOPICAL_OINTMENT | Freq: Two times a day (BID) | CUTANEOUS | 0 refills | Status: DC

## 2019-01-07 MED ORDER — ESZOPICLONE 3 MG PO TABS: 3.0000 mg | ORAL_TABLET | Freq: Every day | ORAL | 0 refills | Status: DC

## 2019-01-07 MED ORDER — MELATONIN 5 MG PO CAPS: 1.0000 | ORAL_CAPSULE | Freq: Every evening | ORAL | 0 refills | Status: DC | PRN

## 2019-01-07 NOTE — Progress Notes (Signed)
Mayford Knife DOB: 09-17-1976 Encounter date: 01/07/2019  This is a 43 y.o. male who presents to establish care. Chief Complaint  Patient presents with  . Transitions Of Care    pointer and thumb on both hands have been extramly dry and cracking,  spot in the back of the neck,  painful to touch, pt states it will go away then come back,  x3 years    History of present illness: Wondering about getting bloodwork done. Was reading somewhere that the dry skin fingers could be sign of diabetes. Has used every diff types of OTC moisturizers and nothing seems to fix it. Sometimes uses neosporin. Hands are just this season.   Has nodule in back of neck. Stays in same line of neck, but mobile. He does tend to pick at lesions and has picked at this.  Wants to make thyroid is still within proper range.   HTN: does check bp at home. Usually about 130/80 at home. Sometimes lower. Pulse fluctuates between 80-114. More when anxious.   OSA on CPAP: does use this regularly.  He has always had a difficult time falling asleep.  He feels the use of CPAP does make this somewhat worse.  He is able to sleep when taking melatonin, Lunesta, Xanax and sometimes Flexeril.  Hypothyroid:taking synthroid daily; would like this rechecked.   Was following with pain mgmt for degen arrthritis L ankle and low back pain. Now not taking pain medication and no longer following with them.  Follows with psychiatry for anxiety, ADD. Sees Dr. Evelene Croon. Prescribes adderall, prozac, xanax. He is on disability for generalized anxiety disorder. Has been on disability since 2012 (official in 2015).  He states that he does not take his Adderall as directed, as this would be too much stimulation for him.  He states that he usually takes 15 mg once daily and symptoms will repeat a second half tablet when feeling "fatigued".  He does not take his Xanax as directed.  He keeps this on hand for severe anxiety, but typically will take 1  tablet at bedtime.  Past Medical History:  Diagnosis Date  . ANXIETY 07/19/2007  . DEPRESSION 07/19/2007  . HEMORRHOIDS, INTERNAL 12/01/2008  . HYPERLIPIDEMIA 07/19/2007  . HYPERTENSION 07/19/2007  . Sleep apnea    Past Surgical History:  Procedure Laterality Date  . left ankle Left   . NASAL SEPTUM SURGERY    . TONSILLECTOMY    . UVULOPALATOPHARYNGOPLASTY (UPPP)/TONSILLECTOMY/SEPTOPLASTY     Allergies  Allergen Reactions  . Tramadol     ?serotonin syndrome   Current Meds  Medication Sig  . alprazolam (XANAX) 2 MG tablet Take 2 mg by mouth 3 (three) times daily as needed for sleep. Prescribed by Dr.Kaur  . amphetamine-dextroamphetamine (ADDERALL) 30 MG tablet Take 30 mg by mouth 3 (three) times daily. Prescribed by Dr. Evelene Croon  . cyclobenzaprine (FLEXERIL) 10 MG tablet TAKE 1 TABLET (10 MG TOTAL) BY MOUTH 3 (THREE) TIMES DAILY AS NEEDED FOR MUSCLE SPASMS.  Marland Kitchen diclofenac (VOLTAREN) 50 MG EC tablet Take 1 tablet (50 mg total) by mouth 2 (two) times daily with a meal.  . divalproex (DEPAKOTE ER) 500 MG 24 hr tablet Take 500 mg by mouth 2 (two) times daily.  Marland Kitchen FLUoxetine (PROZAC) 40 MG capsule 60 mg.   . gabapentin (NEURONTIN) 800 MG tablet Take 1 tablet by mouth 4 (four) times daily.  Marland Kitchen levothyroxine (SYNTHROID, LEVOTHROID) 112 MCG tablet Take 1 tablet (112 mcg total) by mouth daily.  Marland Kitchen  valACYclovir (VALTREX) 500 MG tablet Take 1 tablet (500 mg total) by mouth 2 (two) times daily. (Patient taking differently: Take 500 mg by mouth 2 (two) times daily as needed (Herpes). )   Social History   Tobacco Use  . Smoking status: Former Smoker    Last attempt to quit: 12/06/2011    Years since quitting: 7.0  . Smokeless tobacco: Never Used  Substance Use Topics  . Alcohol use: Yes    Alcohol/week: 1.0 standard drinks    Types: 1 Cans of beer per week   Family History  Problem Relation Age of Onset  . Hypertension Father   . Anxiety disorder Father   . Obesity Mother   . Sleep apnea  Mother   . Rheum arthritis Sister   . Alzheimer's disease Maternal Grandmother   . Stroke Maternal Grandfather   . Alzheimer's disease Paternal Grandmother   . Pancreatic cancer Paternal Grandfather      Review of Systems  Constitutional: Negative for chills, fatigue and fever.  Respiratory: Negative for cough, chest tightness, shortness of breath and wheezing.   Cardiovascular: Negative for chest pain, palpitations and leg swelling.  Musculoskeletal: Positive for arthralgias (left ankle, chronic) and back pain (intermittent).  Skin:       See hpi  Psychiatric/Behavioral: Positive for decreased concentration (better w adderall) and sleep disturbance. The patient is nervous/anxious.     Objective:  BP 130/80 (BP Location: Left Arm, Patient Position: Sitting, Cuff Size: Large)   Pulse 95   Temp 98.9 F (37.2 C) (Oral)   Ht 6\' 3"  (1.905 m)   Wt (!) 332 lb 3.2 oz (150.7 kg)   SpO2 95%   BMI 41.52 kg/m   Weight: (!) 332 lb 3.2 oz (150.7 kg)   BP Readings from Last 3 Encounters:  01/07/19 130/80  05/28/18 (!) 132/92  04/09/18 (!) 144/91   Wt Readings from Last 3 Encounters:  01/07/19 (!) 332 lb 3.2 oz (150.7 kg)  05/28/18 (!) 361 lb (163.7 kg)  04/09/18 (!) 363 lb 9.6 oz (164.9 kg)    Physical Exam Constitutional:      General: He is not in acute distress.    Appearance: He is well-developed.  Cardiovascular:     Rate and Rhythm: Normal rate and regular rhythm.     Heart sounds: Normal heart sounds. No murmur. No friction rub.  Pulmonary:     Effort: Pulmonary effort is normal. No respiratory distress.     Breath sounds: Normal breath sounds. No wheezing or rales.  Musculoskeletal:     Right lower leg: No edema.     Left lower leg: No edema.  Skin:    Comments: Fingertips have significant scaling on all tips.  There is some dryness around cuticles on all fingertips.  There is cracking of the left tip of his thumb.  Skin appears very dry overall.  No palpable nodule  on back of neck.  There is an excoriated lesion.  Unable to tell what this was prior to excoriation, but currently has a bloody base, approximately 0.25cm circumscribed edges.  No surrounding abnormality.  Patient does not appear infected.  Neurological:     Mental Status: He is alert and oriented to person, place, and time.  Psychiatric:        Behavior: Behavior normal.     Assessment/Plan: 1. Eczema, unspecified type Trial steroid cream in addition to hydrating lotion.  Recommended wearing gloves to help with absorption.  Let me know if no  improvement within 2 weeks time.  Limit water and chemical exposure to hands. - triamcinolone ointment (KENALOG) 0.5 %; Apply 1 application topically 2 (two) times daily.  Dispense: 30 g; Refill: 0  2. Insomnia, unspecified type Encouraged him to talk with psychiatry about medications in more detail.  He is taking significantly less than as prescribed to him, however, I worry about the multiple medications he is using for sleep at night.  Think it would be ideal if medication list could be simplified. - Eszopiclone (ESZOPICLONE) 3 MG TABS; Take 1 tablet (3 mg total) by mouth at bedtime. Take immediately before bedtime; Refill: 0 - Melatonin 5 MG CAPS; Take 1 capsule (5 mg total) by mouth at bedtime as needed.; Refill: 0  3. Essential hypertension Stable.  Continue current medication.  He is interested in losing weight and plans to start either weight watchers or keto diet.  We discussed these in more detail today.  He is ready started losing weight which I suspect is due to his corrected thyroid.  Encouraged him to also work on daily exercise. - CBC with Differential/Platelet; Future - Comprehensive metabolic panel; Future - Comprehensive metabolic panel - CBC with Differential/Platelet  4. OSA on CPAP Continue with CPAP  5. Acquired hypothyroidism  - TSH; Future - TSH  6. Dyslipidemia  - Lipid panel; Future - Lipid panel  7. Anxiety: on  disability for this. Controlled on current medications. Taking less than prescribed (xanax). Encouraged him to talk with psychiatrist about continuing to cut back on controlled substances where able.   8. ADD: controlled on less medication than prescribed. Encouraged patient to talk with psychiatrist about medications and side effects so that he is taking prescribed dose with least amount of side effects. He is currently taking significantly less medication than prescribed with good control of add.  Return pending bloodwork.  Theodis Shove, MD

## 2019-01-21 ENCOUNTER — Encounter: Payer: BLUE CROSS/BLUE SHIELD | Admitting: Family Medicine

## 2019-02-05 ENCOUNTER — Ambulatory Visit: Payer: Self-pay

## 2019-02-05 NOTE — Telephone Encounter (Signed)
Patient called in and says that he's been dealing with a cold for several weeks going between him and his daughter. He says his daughter is on an antibiotic and is getting better, but he can't seem to shake the symptoms. He says he has sinus pressure to his forehead, his nose is clear today, but when he blows his nose he's seen some green/clear and his ears pop and feel congested. He says he doesn't have a fever and never ran one during all of this. He says he's taking mucinex at night for cough and is asking for any other recommendations. I advised at this point with everything he's done not working, he will need to be seen in the office. I offered appointment with Dr. Hassan Rowan for tomorrow, he says he will have to call back once he looks at is schedule. Care advice given, patient verbalized understanding.  Reason for Disposition . [1] Sinus pain (not just congestion) AND [2] fever  Answer Assessment - Initial Assessment Questions 1. LOCATION: "Where does it hurt?"      No pain, just pressure at the base of forehead 2. ONSET: "When did the sinus pain start?"  (e.g., hours, days)      Several weeks back and forth 3. SEVERITY: "How bad is the pain?"   (Scale 1-10; mild, moderate or severe)   - MILD (1-3): doesn't interfere with normal activities    - MODERATE (4-7): interferes with normal activities (e.g., work or school) or awakens from sleep   - SEVERE (8-10): excruciating pain and patient unable to do any normal activities        Mild 4. RECURRENT SYMPTOM: "Have you ever had sinus problems before?" If so, ask: "When was the last time?" and "What happened that time?"      Yes 5. NASAL CONGESTION: "Is the nose blocked?" If so, ask, "Can you open it or must you breathe through the mouth?"     No, it's clear now 6. NASAL DISCHARGE: "Do you have discharge from your nose?" If so ask, "What color?"     Not much now, but it was clear with some green 7. FEVER: "Do you have a fever?" If so, ask: "What  is it, how was it measured, and when did it start?"      No 8. OTHER SYMPTOMS: "Do you have any other symptoms?" (e.g., sore throat, cough, earache, difficulty breathing)     Cough intermittent, ear feels congested, sinus drainage sometimes 9. PREGNANCY: "Is there any chance you are pregnant?" "When was your last menstrual period?"     N/A  Protocols used: SINUS PAIN OR CONGESTION-A-AH

## 2019-02-06 NOTE — Telephone Encounter (Signed)
Spoke with patient. He  Stated he feels like he is "on the downward side of things." he no longer has a runny nose and cough has improved. He would like to hold of on the appointment for now. Patient was advised to call back if symptoms persist or worsen.

## 2019-02-06 NOTE — Telephone Encounter (Signed)
Agree with appointment. Make sure he is aware of same day appointment options and that these open up in morning.

## 2019-03-08 ENCOUNTER — Ambulatory Visit: Payer: Medicare Other | Admitting: Family Medicine

## 2019-03-08 ENCOUNTER — Other Ambulatory Visit: Payer: Self-pay

## 2019-03-08 ENCOUNTER — Encounter: Payer: Self-pay | Admitting: Family Medicine

## 2019-03-08 DIAGNOSIS — F988 Other specified behavioral and emotional disorders with onset usually occurring in childhood and adolescence: Secondary | ICD-10-CM

## 2019-03-08 NOTE — Progress Notes (Deleted)
Virtual Visit via Video Note  I connected with@ on 03/08/19 at 11:30 AM EDT by a video enabled telemedicine application and verified that I am speaking with the correct person using two identifiers.  Location patient: home Location provider:work or home office Persons participating in the virtual visit: patient, provider  I discussed the limitations of evaluation and management by telemedicine and the availability of in person appointments. The patient expressed understanding and agreed to proceed.   Brian Rowe DOB: 1975/12/29 Encounter date: 03/08/2019  This is a 43 y.o. male who presents with No chief complaint on file.   History of present illness:  HPI   Allergies  Allergen Reactions  . Tramadol     ?serotonin syndrome   No outpatient medications have been marked as taking for the 03/08/19 encounter (Appointment) with Wynn Banker, MD.    Review of Systems  Objective:  There were no vitals taken for this visit.      BP Readings from Last 3 Encounters:  01/07/19 130/80  05/28/18 (!) 132/92  04/09/18 (!) 144/91   Wt Readings from Last 3 Encounters:  01/07/19 (!) 332 lb 3.2 oz (150.7 kg)  05/28/18 (!) 361 lb (163.7 kg)  04/09/18 (!) 363 lb 9.6 oz (164.9 kg)    EXAM:  GENERAL: alert, oriented, appears well and in no acute distress  HEENT: atraumatic, conjunctiva clear, no obvious abnormalities on inspection of external nose and ears  NECK: normal movements of the head and neck  LUNGS: on inspection no signs of respiratory distress, breathing rate appears normal, no obvious gross SOB, gasping or wheezing  CV: no obvious cyanosis  MS: moves all visible extremities without noticeable abnormality  PSYCH/NEURO: pleasant and cooperative, no obvious depression or anxiety, speech and thought processing grossly intact ***  Assessment/Plan  There are no diagnoses linked to this encounter.     I discussed the assessment and treatment plan with the  patient. The patient was provided an opportunity to ask questions and all were answered. The patient agreed with the plan and demonstrated an understanding of the instructions.   The patient was advised to call back or seek an in-person evaluation if the symptoms worsen or if the condition fails to improve as anticipated.  I provided *** minutes of non-face-to-face time during this encounter.   Theodis Shove, MD

## 2019-04-08 ENCOUNTER — Other Ambulatory Visit: Payer: Self-pay | Admitting: Family Medicine

## 2019-04-12 ENCOUNTER — Telehealth: Payer: Self-pay | Admitting: *Deleted

## 2019-04-12 MED ORDER — MELOXICAM 15 MG PO TABS: 15.0000 mg | ORAL_TABLET | Freq: Every day | ORAL | 0 refills | Status: DC

## 2019-04-12 NOTE — Telephone Encounter (Signed)
[facility] faxed a refill request for Meloxicam 15mg -take 1 tablet by mouth every day-#30.  Rx is not on the pts current med list and was last given by Dr Amador Cunas.

## 2019-04-12 NOTE — Telephone Encounter (Signed)
Rx done. 

## 2019-04-12 NOTE — Telephone Encounter (Signed)
Ok for refill? 

## 2019-06-14 ENCOUNTER — Other Ambulatory Visit: Payer: Self-pay | Admitting: Family Medicine

## 2019-07-22 NOTE — Progress Notes (Signed)
No show for virtual visit  

## 2019-08-13 ENCOUNTER — Other Ambulatory Visit: Payer: Self-pay | Admitting: Family Medicine

## 2019-08-13 NOTE — Telephone Encounter (Signed)
Medication: cyclobenzaprine (FLEXERIL) 10 MG tablet    Patient is requesting a refill of this medication.    Pharmacy:  [facility] [address], [place] [address] [phone] (Phone) [phone] (Fax)

## 2019-08-14 MED ORDER — CYCLOBENZAPRINE HCL 10 MG PO TABS: 10.0000 mg | ORAL_TABLET | Freq: Three times a day (TID) | ORAL | 1 refills | Status: DC | PRN

## 2019-08-14 NOTE — Telephone Encounter (Signed)
Please schedule [facility]

## 2019-08-19 NOTE — Telephone Encounter (Signed)
Left a detailed message for the pt to call back for an appt as below.

## 2019-09-09 DIAGNOSIS — Z23 Encounter for immunization: Secondary | ICD-10-CM | POA: Diagnosis not present

## 2020-01-03 ENCOUNTER — Ambulatory Visit: Payer: Medicare Other | Admitting: Family Medicine

## 2020-01-11 ENCOUNTER — Other Ambulatory Visit: Payer: Self-pay | Admitting: Family Medicine

## 2020-01-15 ENCOUNTER — Other Ambulatory Visit: Payer: Self-pay | Admitting: Family Medicine

## 2020-03-10 DIAGNOSIS — Z23 Encounter for immunization: Secondary | ICD-10-CM | POA: Diagnosis not present

## 2020-04-04 ENCOUNTER — Encounter (HOSPITAL_COMMUNITY): Payer: Self-pay | Admitting: Emergency Medicine

## 2020-04-04 ENCOUNTER — Other Ambulatory Visit: Payer: Self-pay

## 2020-04-04 ENCOUNTER — Inpatient Hospital Stay (HOSPITAL_COMMUNITY)
Admission: EM | Admit: 2020-04-04 | Discharge: 2020-04-05 | DRG: 684 | Discharge status: Against Medical Advice | Payer: Medicare Other | Attending: Internal Medicine | Admitting: Internal Medicine

## 2020-04-04 ENCOUNTER — Emergency Department (HOSPITAL_COMMUNITY): Payer: Medicare Other

## 2020-04-04 DIAGNOSIS — Z5329 Procedure and treatment not carried out because of patient's decision for other reasons: Secondary | ICD-10-CM | POA: Diagnosis present

## 2020-04-04 DIAGNOSIS — N179 Acute kidney failure, unspecified: Secondary | ICD-10-CM | POA: Diagnosis not present

## 2020-04-04 DIAGNOSIS — Z20822 Contact with and (suspected) exposure to covid-19: Secondary | ICD-10-CM | POA: Diagnosis present

## 2020-04-04 DIAGNOSIS — G4733 Obstructive sleep apnea (adult) (pediatric): Secondary | ICD-10-CM | POA: Diagnosis not present

## 2020-04-04 DIAGNOSIS — F909 Attention-deficit hyperactivity disorder, unspecified type: Secondary | ICD-10-CM | POA: Diagnosis present

## 2020-04-04 DIAGNOSIS — Z8261 Family history of arthritis: Secondary | ICD-10-CM

## 2020-04-04 DIAGNOSIS — F419 Anxiety disorder, unspecified: Secondary | ICD-10-CM | POA: Diagnosis present

## 2020-04-04 DIAGNOSIS — I10 Essential (primary) hypertension: Secondary | ICD-10-CM | POA: Diagnosis not present

## 2020-04-04 DIAGNOSIS — S0990XA Unspecified injury of head, initial encounter: Secondary | ICD-10-CM

## 2020-04-04 DIAGNOSIS — R4182 Altered mental status, unspecified: Secondary | ICD-10-CM

## 2020-04-04 DIAGNOSIS — R4781 Slurred speech: Secondary | ICD-10-CM | POA: Diagnosis present

## 2020-04-04 DIAGNOSIS — Z820 Family history of epilepsy and other diseases of the nervous system: Secondary | ICD-10-CM

## 2020-04-04 DIAGNOSIS — W19XXXA Unspecified fall, initial encounter: Secondary | ICD-10-CM

## 2020-04-04 DIAGNOSIS — E785 Hyperlipidemia, unspecified: Secondary | ICD-10-CM | POA: Diagnosis present

## 2020-04-04 DIAGNOSIS — Z87891 Personal history of nicotine dependence: Secondary | ICD-10-CM

## 2020-04-04 DIAGNOSIS — Z8249 Family history of ischemic heart disease and other diseases of the circulatory system: Secondary | ICD-10-CM

## 2020-04-04 DIAGNOSIS — Z885 Allergy status to narcotic agent status: Secondary | ICD-10-CM

## 2020-04-04 DIAGNOSIS — Z79890 Hormone replacement therapy (postmenopausal): Secondary | ICD-10-CM

## 2020-04-04 DIAGNOSIS — M546 Pain in thoracic spine: Secondary | ICD-10-CM | POA: Diagnosis present

## 2020-04-04 DIAGNOSIS — Z800 Family history of malignant neoplasm of digestive organs: Secondary | ICD-10-CM

## 2020-04-04 DIAGNOSIS — Z818 Family history of other mental and behavioral disorders: Secondary | ICD-10-CM

## 2020-04-04 DIAGNOSIS — Z823 Family history of stroke: Secondary | ICD-10-CM

## 2020-04-04 LAB — CBC WITH DIFFERENTIAL/PLATELET
Abs Immature Granulocytes: 0.02 10*3/uL (ref 0.00–0.07)
Basophils Absolute: 0 10*3/uL (ref 0.0–0.1)
Basophils Relative: 0 %
Eosinophils Absolute: 0.1 10*3/uL (ref 0.0–0.5)
Eosinophils Relative: 1 %
HCT: 51.3 % (ref 39.0–52.0)
Hemoglobin: 17.5 g/dL — ABNORMAL HIGH (ref 13.0–17.0)
Immature Granulocytes: 0 %
Lymphocytes Relative: 18 %
Lymphs Abs: 1.8 10*3/uL (ref 0.7–4.0)
MCH: 31.4 pg (ref 26.0–34.0)
MCHC: 34.1 g/dL (ref 30.0–36.0)
MCV: 92.1 fL (ref 80.0–100.0)
Monocytes Absolute: 0.8 10*3/uL (ref 0.1–1.0)
Monocytes Relative: 8 %
Neutro Abs: 7.1 10*3/uL (ref 1.7–7.7)
Neutrophils Relative %: 73 %
Platelets: 247 10*3/uL (ref 150–400)
RBC: 5.57 MIL/uL (ref 4.22–5.81)
RDW: 12.8 % (ref 11.5–15.5)
WBC: 9.8 10*3/uL (ref 4.0–10.5)
nRBC: 0 % (ref 0.0–0.2)

## 2020-04-04 LAB — CBG MONITORING, ED: Glucose-Capillary: 135 mg/dL — ABNORMAL HIGH (ref 70–99)

## 2020-04-04 NOTE — ED Triage Notes (Signed)
Patient reported that he fell 3 times from a ladder today , no LOC , reports mid/right low back pain and headache , alert and oriented , speech clear / no arm drift / no facial asymmetry .

## 2020-04-04 NOTE — ED Notes (Signed)
Brian Rowe [phone] Mother is here and would like to be notified when In room

## 2020-04-05 ENCOUNTER — Inpatient Hospital Stay (HOSPITAL_COMMUNITY): Payer: Medicare Other

## 2020-04-05 ENCOUNTER — Emergency Department (HOSPITAL_COMMUNITY): Payer: Medicare Other

## 2020-04-05 ENCOUNTER — Other Ambulatory Visit: Payer: Self-pay

## 2020-04-05 ENCOUNTER — Emergency Department (HOSPITAL_COMMUNITY)
Admission: EM | Admit: 2020-04-05 | Discharge: 2020-04-05 | Disposition: A | Discharge status: Home / Self Care | Payer: Medicare Other | Attending: Emergency Medicine | Admitting: Emergency Medicine

## 2020-04-05 ENCOUNTER — Encounter (HOSPITAL_COMMUNITY): Payer: Self-pay | Admitting: Emergency Medicine

## 2020-04-05 DIAGNOSIS — Z79899 Other long term (current) drug therapy: Secondary | ICD-10-CM | POA: Insufficient documentation

## 2020-04-05 DIAGNOSIS — S3992XA Unspecified injury of lower back, initial encounter: Secondary | ICD-10-CM | POA: Diagnosis not present

## 2020-04-05 DIAGNOSIS — Z87891 Personal history of nicotine dependence: Secondary | ICD-10-CM | POA: Insufficient documentation

## 2020-04-05 DIAGNOSIS — N289 Disorder of kidney and ureter, unspecified: Secondary | ICD-10-CM | POA: Diagnosis not present

## 2020-04-05 DIAGNOSIS — Z9989 Dependence on other enabling machines and devices: Secondary | ICD-10-CM

## 2020-04-05 DIAGNOSIS — M546 Pain in thoracic spine: Secondary | ICD-10-CM | POA: Diagnosis not present

## 2020-04-05 DIAGNOSIS — Z20822 Contact with and (suspected) exposure to covid-19: Secondary | ICD-10-CM | POA: Diagnosis present

## 2020-04-05 DIAGNOSIS — Z79890 Hormone replacement therapy (postmenopausal): Secondary | ICD-10-CM | POA: Diagnosis not present

## 2020-04-05 DIAGNOSIS — R4789 Other speech disturbances: Secondary | ICD-10-CM | POA: Insufficient documentation

## 2020-04-05 DIAGNOSIS — G4733 Obstructive sleep apnea (adult) (pediatric): Secondary | ICD-10-CM | POA: Diagnosis present

## 2020-04-05 DIAGNOSIS — S0990XA Unspecified injury of head, initial encounter: Secondary | ICD-10-CM | POA: Diagnosis not present

## 2020-04-05 DIAGNOSIS — R5383 Other fatigue: Secondary | ICD-10-CM | POA: Diagnosis not present

## 2020-04-05 DIAGNOSIS — E039 Hypothyroidism, unspecified: Secondary | ICD-10-CM | POA: Insufficient documentation

## 2020-04-05 DIAGNOSIS — F909 Attention-deficit hyperactivity disorder, unspecified type: Secondary | ICD-10-CM | POA: Diagnosis present

## 2020-04-05 DIAGNOSIS — W11XXXD Fall on and from ladder, subsequent encounter: Secondary | ICD-10-CM | POA: Insufficient documentation

## 2020-04-05 DIAGNOSIS — R4182 Altered mental status, unspecified: Secondary | ICD-10-CM | POA: Diagnosis not present

## 2020-04-05 DIAGNOSIS — S99922A Unspecified injury of left foot, initial encounter: Secondary | ICD-10-CM | POA: Diagnosis not present

## 2020-04-05 DIAGNOSIS — Z5329 Procedure and treatment not carried out because of patient's decision for other reasons: Secondary | ICD-10-CM | POA: Diagnosis present

## 2020-04-05 DIAGNOSIS — Z818 Family history of other mental and behavioral disorders: Secondary | ICD-10-CM | POA: Diagnosis not present

## 2020-04-05 DIAGNOSIS — N179 Acute kidney failure, unspecified: Secondary | ICD-10-CM | POA: Diagnosis present

## 2020-04-05 DIAGNOSIS — Z8261 Family history of arthritis: Secondary | ICD-10-CM | POA: Diagnosis not present

## 2020-04-05 DIAGNOSIS — Z823 Family history of stroke: Secondary | ICD-10-CM | POA: Diagnosis not present

## 2020-04-05 DIAGNOSIS — R4781 Slurred speech: Secondary | ICD-10-CM | POA: Diagnosis present

## 2020-04-05 DIAGNOSIS — Z885 Allergy status to narcotic agent status: Secondary | ICD-10-CM | POA: Diagnosis not present

## 2020-04-05 DIAGNOSIS — I10 Essential (primary) hypertension: Secondary | ICD-10-CM

## 2020-04-05 DIAGNOSIS — Z8249 Family history of ischemic heart disease and other diseases of the circulatory system: Secondary | ICD-10-CM | POA: Diagnosis not present

## 2020-04-05 DIAGNOSIS — F419 Anxiety disorder, unspecified: Secondary | ICD-10-CM | POA: Diagnosis present

## 2020-04-05 DIAGNOSIS — R296 Repeated falls: Secondary | ICD-10-CM | POA: Diagnosis not present

## 2020-04-05 DIAGNOSIS — Z800 Family history of malignant neoplasm of digestive organs: Secondary | ICD-10-CM | POA: Diagnosis not present

## 2020-04-05 DIAGNOSIS — Z820 Family history of epilepsy and other diseases of the nervous system: Secondary | ICD-10-CM | POA: Diagnosis not present

## 2020-04-05 DIAGNOSIS — E785 Hyperlipidemia, unspecified: Secondary | ICD-10-CM | POA: Diagnosis present

## 2020-04-05 HISTORY — DX: Acute kidney failure, unspecified: N17.9

## 2020-04-05 LAB — BASIC METABOLIC PANEL
Anion gap: 12 (ref 5–15)
Anion gap: 11 (ref 5–15)
Anion gap: 8 (ref 5–15)
BUN: 13 mg/dL (ref 6–20)
BUN: 14 mg/dL (ref 6–20)
BUN: 14 mg/dL (ref 6–20)
CO2: 23 mmol/L (ref 22–32)
CO2: 24 mmol/L (ref 22–32)
CO2: 26 mmol/L (ref 22–32)
Calcium: 9.1 mg/dL (ref 8.9–10.3)
Calcium: 8.8 mg/dL — ABNORMAL LOW (ref 8.9–10.3)
Calcium: 8.5 mg/dL — ABNORMAL LOW (ref 8.9–10.3)
Chloride: 104 mmol/L (ref 98–111)
Chloride: 104 mmol/L (ref 98–111)
Chloride: 107 mmol/L (ref 98–111)
Creatinine, Ser: 2.42 mg/dL — ABNORMAL HIGH (ref 0.61–1.24)
Creatinine, Ser: 2.23 mg/dL — ABNORMAL HIGH (ref 0.61–1.24)
Creatinine, Ser: 2.22 mg/dL — ABNORMAL HIGH (ref 0.61–1.24)
GFR calc Af Amer: 37 mL/min — ABNORMAL LOW (ref >60)
GFR calc Af Amer: 41 mL/min — ABNORMAL LOW (ref >60)
GFR calc Af Amer: 41 mL/min — ABNORMAL LOW (ref >60)
GFR calc non Af Amer: 32 mL/min — ABNORMAL LOW (ref >60)
GFR calc non Af Amer: 35 mL/min — ABNORMAL LOW (ref >60)
GFR calc non Af Amer: 35 mL/min — ABNORMAL LOW (ref >60)
Glucose, Bld: 142 mg/dL — ABNORMAL HIGH (ref 70–99)
Glucose, Bld: 90 mg/dL (ref 70–99)
Glucose, Bld: 90 mg/dL (ref 70–99)
Potassium: 4.1 mmol/L (ref 3.5–5.1)
Potassium: 3.8 mmol/L (ref 3.5–5.1)
Potassium: 4 mmol/L (ref 3.5–5.1)
Sodium: 139 mmol/L (ref 135–145)
Sodium: 139 mmol/L (ref 135–145)
Sodium: 141 mmol/L (ref 135–145)

## 2020-04-05 LAB — URINALYSIS, ROUTINE W REFLEX MICROSCOPIC
Bilirubin Urine: NEGATIVE
Glucose, UA: NEGATIVE mg/dL
Hgb urine dipstick: NEGATIVE
Ketones, ur: NEGATIVE mg/dL
Leukocytes,Ua: NEGATIVE
Nitrite: NEGATIVE
Protein, ur: NEGATIVE mg/dL
Specific Gravity, Urine: 1.012 (ref 1.005–1.030)
pH: 5 (ref 5.0–8.0)

## 2020-04-05 LAB — VALPROIC ACID LEVEL: Valproic Acid Lvl: <10 ug/mL — ABNORMAL LOW (ref 50.0–100.0)

## 2020-04-05 LAB — RESPIRATORY PANEL BY RT PCR (FLU A&B, COVID)
Influenza A by PCR: NEGATIVE
Influenza B by PCR: NEGATIVE
SARS Coronavirus 2 by RT PCR: NEGATIVE

## 2020-04-05 LAB — RAPID URINE DRUG SCREEN, HOSP PERFORMED
Amphetamines: NOT DETECTED
Barbiturates: NOT DETECTED
Benzodiazepines: POSITIVE — AB
Cocaine: NOT DETECTED
Opiates: NOT DETECTED
Tetrahydrocannabinol: NOT DETECTED

## 2020-04-05 LAB — HIV ANTIBODY (ROUTINE TESTING W REFLEX): HIV Screen 4th Generation wRfx: NONREACTIVE

## 2020-04-05 LAB — ETHANOL: Alcohol, Ethyl (B): <10 mg/dL (ref <10)

## 2020-04-05 MED ORDER — SODIUM CHLORIDE 0.9 % IV BOLUS
1000.0000 mL | Freq: Once | INTRAVENOUS | Status: AC
  Administered 2020-04-05: 18:00:00 1000 mL via INTRAVENOUS

## 2020-04-05 MED ORDER — ONDANSETRON HCL 4 MG PO TABS: 4.0000 mg | ORAL_TABLET | Freq: Four times a day (QID) | ORAL | Status: DC | PRN

## 2020-04-05 MED ORDER — ONDANSETRON HCL 4 MG/2ML IJ SOLN: 4.0000 mg | Freq: Four times a day (QID) | INTRAMUSCULAR | Status: DC | PRN

## 2020-04-05 MED ORDER — LEVOTHYROXINE SODIUM 112 MCG PO TABS
112.0000 ug | ORAL_TABLET | Freq: Every day | ORAL | Status: DC
  Administered 2020-04-05: 112 ug via ORAL
  Filled 2020-04-05: qty 1

## 2020-04-05 MED ORDER — ONDANSETRON HCL 4 MG/2ML IJ SOLN
4.0000 mg | Freq: Once | INTRAMUSCULAR | Status: AC
  Administered 2020-04-05: 4 mg via INTRAVENOUS
  Filled 2020-04-05: qty 2

## 2020-04-05 MED ORDER — ENOXAPARIN SODIUM 80 MG/0.8ML ~~LOC~~ SOLN
80.0000 mg | SUBCUTANEOUS | Status: DC
  Filled 2020-04-05: qty 0.8

## 2020-04-05 MED ORDER — METHOCARBAMOL 500 MG PO TABS
500.0000 mg | ORAL_TABLET | Freq: Three times a day (TID) | ORAL | Status: DC | PRN
  Administered 2020-04-05: 500 mg via ORAL
  Filled 2020-04-05: qty 1

## 2020-04-05 MED ORDER — SODIUM CHLORIDE 0.9 % IV SOLN: INTRAVENOUS | Status: DC

## 2020-04-05 MED ORDER — FENTANYL CITRATE (PF) 100 MCG/2ML IJ SOLN
50.0000 ug | Freq: Once | INTRAMUSCULAR | Status: AC
  Administered 2020-04-05: 50 ug via INTRAVENOUS
  Filled 2020-04-05: qty 2

## 2020-04-05 MED ORDER — FLUOXETINE HCL 20 MG PO CAPS
40.0000 mg | ORAL_CAPSULE | Freq: Every day | ORAL | Status: DC
  Administered 2020-04-05: 40 mg via ORAL
  Filled 2020-04-05 (×2): qty 2

## 2020-04-05 MED ORDER — ALPRAZOLAM 0.25 MG PO TABS: 2.0000 mg | ORAL_TABLET | Freq: Three times a day (TID) | ORAL | Status: DC | PRN

## 2020-04-05 MED ORDER — SODIUM CHLORIDE 0.9 % IV BOLUS (SEPSIS)
1000.0000 mL | Freq: Once | INTRAVENOUS | Status: AC
  Administered 2020-04-05: 1000 mL via INTRAVENOUS

## 2020-04-05 NOTE — ED Notes (Signed)
Pt has pulled IV out, taken self off monitor-- states "I am leaving" Dr. Margo Aye paged

## 2020-04-05 NOTE — Discharge Instructions (Addendum)
Your kidney function has been stable over your last three blood draws. It is higher than you have been in the past although this was over a year ago. I suspect this is more of a chronic issue. I do not want you to continue to take. meloxicam or any other NSAIDs (ibuprofen, aleve, etc).  You actually had both a CT scan and a MRI of your brain yesterday. Neither of them showed anything concerning. I suspect you may have a concussion. If so, the symptoms may last for days to weeks but should progressively improve. If you do not feel like they are then I would like your to follow-up with neurology. You need to follow-up with your PCP with regards to you kidney function. They may want you to see a nephrologist (kidney doctor). This can appropriately been done as an outpatient.

## 2020-04-05 NOTE — ED Notes (Signed)
After talking with doctor patient stated still wants to leave. Nurse tried to help patient find his wallet. Patient called his mom who has his wallet and will pick patient up. Patient sign out AMA and stated he will call his primary doctor tomorrow. Nurse instructed to come back to the ED anytime if he changed his mind and for any concerns. Stated "I will and thank you"

## 2020-04-05 NOTE — ED Notes (Signed)
Pt returned to room from MRI.

## 2020-04-05 NOTE — ED Notes (Signed)
Lunch Tray Ordered @ 1030. 

## 2020-04-05 NOTE — Progress Notes (Signed)
Brian Rowe is a 44 y.o. male with medical history significant of ADHD, chronic anxiety, OSA on CPAP, HTN, chronic ankle and back pain.  Pt presents to ED after falling off his ladder striking his head once. Since falling and hitting head, he has been having slurred speech with stuttering.  This prompted him to come in to ED for further evaluation.   ED Course:  Chronic back pain is slightly worse since falling off of the ladder but is otherwise consistent with chronic back pain.  CT head and MRI brain neg.  UDS shows benzos (he is chronically on xanax 2mg  TID PRN).  Work up in ED is most significant for creatinine of 2.4 up from 1.0 just last year, BUN however is only 12.  UA negative for pyuria.  [facility] asked to admit.  04/05/20: Seen and examined, noted slurred speech and mild stuttering.  Reports back spasms.  Robaxin ordered.    Please refer to H&P dictated by my partner Dr. 06/05/20 on 04/05/2020 for further details of the assessment and plan.

## 2020-04-05 NOTE — H&P (Addendum)
History and Physical    Brian Rowe ERD:[id number] DOB: 12/02/1976 DOA: 04/04/2020  PCP: Wynn Banker, MD  Patient coming from: Home  I have personally briefly reviewed patient's old medical records in [facility]  Chief Complaint: Fall, slurred speech  HPI: Brian Rowe is a 44 y.o. male with medical history significant of ADHD, anxiety, OSA on CPAP, HTN, chronic ankle and back pain.  Pt presents to ED after falls.  Apparently fell up to 3 times off ~3 feet off of ladder yesterday striking his head once.  Was in garage trying to get rid of birds that were defecating on car he says.  Was staying hydrated and doesn't think he got dehydrated or anything.  Since falling and hitting head, he and his mother noticed that patient was having slurred / stuttering speech.  This prompted them to come in to ED.   ED Course: speech slowly improving while in ED.  Does have chronic back pain that he thinks is slightly worse since falling off of the ladder but is otherwise consistent with chronic back pain.  CT and MRI brain neg.  UDS just shows benzos (he is chronically on xanax 2mg  TID PRN, PMP aware confirms he is prescribed this).  Work up in ED is most significant for creatinine of 2.4 up from 1.0 just last year, BUN however is only 12.   Review of Systems: As per HPI, otherwise all review of systems negative.  Past Medical History:  Diagnosis Date  . ANXIETY 07/19/2007  . DEPRESSION 07/19/2007  . HEMORRHOIDS, INTERNAL 12/01/2008  . HYPERLIPIDEMIA 07/19/2007  . HYPERTENSION 07/19/2007  . Sleep apnea     Past Surgical History:  Procedure Laterality Date  . left ankle Left   . NASAL SEPTUM SURGERY    . TONSILLECTOMY    . UVULOPALATOPHARYNGOPLASTY (UPPP)/TONSILLECTOMY/SEPTOPLASTY       reports that he quit smoking about 8 years ago. He has never used smokeless tobacco. He reports current alcohol use of about 1.0 standard drinks of alcohol per week. He reports that he  does not use drugs.  Allergies  Allergen Reactions  . Tramadol     ?serotonin syndrome    Family History  Problem Relation Age of Onset  . Hypertension Father   . Anxiety disorder Father   . Obesity Mother   . Sleep apnea Mother   . Rheum arthritis Sister   . Alzheimer's disease Maternal Grandmother   . Stroke Maternal Grandfather   . Alzheimer's disease Paternal Grandmother   . Pancreatic cancer Paternal Grandfather      Prior to Admission medications   Medication Sig Start Date End Date Taking? Authorizing Provider  alprazolam 07/21/2007) 2 MG tablet Take 2 mg by mouth 3 (three) times daily as needed for sleep. Prescribed by Dr.Kaur   Yes [provider]  Eszopiclone (ESZOPICLONE) 3 MG TABS Take 1 tablet (3 mg total) by mouth at bedtime. Take immediately before bedtime 01/07/19  Yes Koberlein, Junell C, MD  FLUoxetine (PROZAC) 40 MG capsule Take 40 mg by mouth daily.  02/18/16  Yes [provider]  levothyroxine (SYNTHROID) 112 MCG tablet TAKE 1 TABLET BY MOUTH EVERY DAY Patient taking differently: Take 112 mcg by mouth daily before breakfast.  01/13/20  Yes 03/12/20, MD    Physical Exam: Vitals:   04/04/20 2244 04/05/20 0256 04/05/20 0345 04/05/20 0400  BP:  124/90 (!) 140/94 131/77  Pulse:  90 83 80  Resp:  18 19 (!)  26  Temp:  98.5 F (36.9 C) 98.2 F (36.8 C)   TempSrc:  Oral Oral   SpO2:  97% 97% 94%  Weight: (!) 170 kg     Height: 6\' 3"  (1.905 m)       Constitutional: NAD, calm, comfortable Eyes: PERRL, lids and conjunctivae normal ENMT: Mucous membranes are moist. Posterior pharynx clear of any exudate or lesions.Normal dentition.  Neck: normal, supple, no masses, no thyromegaly Respiratory: clear to auscultation bilaterally, no wheezing, no crackles. Normal respiratory effort. No accessory muscle use.  Cardiovascular: Regular rate and rhythm, no murmurs / rubs / gallops. No extremity edema. 2+ pedal pulses. No carotid bruits.  Abdomen:  no tenderness, no masses palpated. No hepatosplenomegaly. Bowel sounds positive.  Musculoskeletal: no clubbing / cyanosis. No joint deformity upper and lower extremities. Good ROM, no contractures. Normal muscle tone.  Skin: no rashes, lesions, ulcers. No induration Neurologic: CN 2-12 grossly intact. Sensation intact, DTR normal. Strength 5/5 in all 4.  Psychiatric: Normal judgment and insight. Alert and oriented x 3. Normal mood.    Labs on Admission: I have personally reviewed following labs and imaging studies  CBC: Recent Labs  Lab 04/04/20 2302  WBC 9.8  NEUTROABS 7.1  HGB 17.5*  HCT 51.3  MCV 92.1  PLT 247   Basic Metabolic Panel: Recent Labs  Lab 04/04/20 2302  NA 139  K 4.1  CL 104  CO2 23  GLUCOSE 142*  BUN 13  CREATININE 2.42*  CALCIUM 9.1   GFR: Estimated Creatinine Clearance: 66.8 mL/min (A) (by C-G formula based on SCr of 2.42 mg/dL (H)). Liver Function Tests: No results for input(s): AST, ALT, ALKPHOS, BILITOT, PROT, ALBUMIN in the last 168 hours. No results for input(s): LIPASE, AMYLASE in the last 168 hours. No results for input(s): AMMONIA in the last 168 hours. Coagulation Profile: No results for input(s): INR, PROTIME in the last 168 hours. Cardiac Enzymes: No results for input(s): CKTOTAL, CKMB, CKMBINDEX, TROPONINI in the last 168 hours. BNP (last 3 results) No results for input(s): PROBNP in the last 8760 hours. HbA1C: No results for input(s): HGBA1C in the last 72 hours. CBG: Recent Labs  Lab 04/04/20 2246  GLUCAP 135*   Lipid Profile: No results for input(s): CHOL, HDL, LDLCALC, TRIG, CHOLHDL, LDLDIRECT in the last 72 hours. Thyroid Function Tests: No results for input(s): TSH, T4TOTAL, FREET4, T3FREE, THYROIDAB in the last 72 hours. Anemia Panel: No results for input(s): VITAMINB12, FOLATE, FERRITIN, TIBC, IRON, RETICCTPCT in the last 72 hours. Urine analysis:    Component Value Date/Time   COLORURINE YELLOW 04/05/2020 0412    APPEARANCEUR CLOUDY (A) 04/05/2020 0412   LABSPEC 1.012 04/05/2020 0412   PHURINE 5.0 04/05/2020 0412   GLUCOSEU NEGATIVE 04/05/2020 0412   HGBUR NEGATIVE 04/05/2020 0412   HGBUR negative 02/18/2008 0813   BILIRUBINUR NEGATIVE 04/05/2020 0412   BILIRUBINUR n 05/24/2016 0900   KETONESUR NEGATIVE 04/05/2020 0412   PROTEINUR NEGATIVE 04/05/2020 0412   UROBILINOGEN 0.2 05/24/2016 0900   UROBILINOGEN 1.0 03/28/2015 1858   NITRITE NEGATIVE 04/05/2020 0412   LEUKOCYTESUR NEGATIVE 04/05/2020 0412    Radiological Exams on Admission: CT Head Wo Contrast  Result Date: 04/04/2020 CLINICAL DATA:  Fall from ladder EXAM: CT HEAD WITHOUT CONTRAST TECHNIQUE: Contiguous axial images were obtained from the base of the skull through the vertex without intravenous contrast. COMPARISON:  None. FINDINGS: Brain: There is no mass, hemorrhage or extra-axial collection. The size and configuration of the ventricles and extra-axial CSF spaces  are normal. The brain parenchyma is normal, without acute or chronic infarction. Vascular: No abnormal hyperdensity of the major intracranial arteries or dural venous sinuses. No intracranial atherosclerosis. Skull: The visualized skull base, calvarium and extracranial soft tissues are normal. Sinuses/Orbits: No fluid levels or advanced mucosal thickening of the visualized paranasal sinuses. No mastoid or middle ear effusion. The orbits are normal. IMPRESSION: Normal head CT. Electronically Signed   By: Ulyses Jarred M.D.   On: 04/04/2020 23:20   MR BRAIN WO CONTRAST  Result Date: 04/05/2020 CLINICAL DATA:  Multiple falls. Speech changes. EXAM: MRI HEAD WITHOUT CONTRAST TECHNIQUE: Multiplanar, multiecho pulse sequences of the brain and surrounding structures were obtained without intravenous contrast. COMPARISON:  None. FINDINGS: BRAIN: No acute infarct, acute hemorrhage or extra-axial collection. Normal white matter signal for age. Normal volume of brain parenchyma and CSF spaces.  Midline structures are normal. VASCULAR: Major flow voids are preserved. Susceptibility-sensitive sequences show no chronic microhemorrhage or superficial siderosis. SKULL AND UPPER CERVICAL SPINE: Normal calvarium and skull base. Visualized upper cervical spine and soft tissues are normal. SINUSES/ORBITS: No paranasal sinus fluid levels or advanced mucosal thickening. No mastoid or middle ear effusion. Normal orbits. IMPRESSION: Normal brain MRI. Electronically Signed   By: Ulyses Jarred M.D.   On: 04/05/2020 05:56    EKG: Independently reviewed.  Assessment/Plan Principal Problem:   AKI (acute kidney injury) ([place]) Active Problems:   Essential hypertension   Thoracic back pain   OSA on CPAP   Slurred speech    1. AKI - 1. Unclear cause 2. Doesn't look to be pre-renal based on BUN:Cr ratio 3. Pt denies taking anything over the counter for pain other than tylenol (specifically denies ibuprofen or aleve etc) 4. Got 1L NS bolus in ED, will leave him on NS at 125 cc/hr for now 5. Repeat BMP this morning ordered 6. Renal US ordered 7. Call nephrology in AM most likely 2. Slurred speech / confusion - 1. Mild, improving 2. Given HPI, suspect concussion 3. MRI brain neg, CT head neg 3. OSA - 1. Cont CPAP 4. Anxiety - 1. Cont Xanax 2mg  TID PRN 2. Did verify that he takes this with PMP aware and it does look like has a current script for this dosage 5. HTN - 1. Not on any home or chronic meds for this 6. Low back pain - 1. Seems to be pretty much just his chronic back pain from what patient is telling me, maybe a little bit exacerbated by fall.  DVT prophylaxis: Lovenox Code Status: Full Family Communication: No family in room Disposition Plan: Home after work up for AKI Consults called: None Admission status: Admit to inpatient  Severity of Illness: The appropriate patient status for this patient is INPATIENT. Inpatient status is judged to be reasonable and necessary in order to  provide the required intensity of service to ensure the patient's safety. The patient's presenting symptoms, physical exam findings, and initial radiographic and laboratory data in the context of their chronic comorbidities is felt to place them at high risk for further clinical deterioration. Furthermore, it is not anticipated that the patient will be medically stable for discharge from the hospital within 2 midnights of admission. The following factors support the patient status of inpatient.   IP status due to AKI with creat of 2.4 up from baseline 1.0 just last year.   * I certify that at the point of admission it is my clinical judgment that the patient will require inpatient hospital  care spanning beyond 2 midnights from the point of admission due to high intensity of service, high risk for further deterioration and high frequency of surveillance required.*    Proctor Carriker M. DO [facility]  How to contact the [facility] Attending or Consulting provider 7A - 7P or covering provider during after hours 7P -7A, for this patient?  1. Check the care team in [facility] and look for a) attending/consulting [facility] provider listed and b) the [facility] team listed 2. Log into [email or web address]  Amion Physician Scheduling and messaging for groups and whole hospitals  On call and physician scheduling software for group practices, residents, hospitalists and other medical providers for call, clinic, rotation and shift schedules. OnCall Enterprise is a hospital-wide system for scheduling doctors and paging doctors on call. EasyPlot is for scientific plotting and data analysis.  [email or web address]  and use [place]'s universal password to access. If you do not have the password, please contact the hospital operator.  3. Locate the [facility] provider you are looking for under [facility] and page to a number that you can be directly reached. 4. If you still have difficulty reaching the provider, please page the [facility] (Director  on Call) for the Hospitalists listed on amion for assistance.  04/05/2020, 6:30 AM

## 2020-04-05 NOTE — ED Notes (Signed)
Pt verbalized understanding of d/c instructions, follow up care, s/s requiring return to ed. Pt had no additional questions at this time and was transported via wheelchair to exit

## 2020-04-05 NOTE — ED Notes (Signed)
Pt and visitor both state that they notice pt has been exhibiting slurred speech since he fell before coming into ED.

## 2020-04-05 NOTE — ED Notes (Signed)
Patient not in room; still in MRI.

## 2020-04-05 NOTE — ED Triage Notes (Signed)
Pt reports he left AMA this am after doctors wanted him to be admitted for abnormal labs related to his kidney function. Pt states he would like to be re-evaluated.

## 2020-04-05 NOTE — ED Provider Notes (Signed)
TIME SEEN: 4:13 AM  CHIEF COMPLAINT: AMS, head injury, back pain, slurred speech  HPI: Patient is a 44 year old male with history of hypertension, hyperlipidemia, depression, obesity who presents to the emergency department with his mother for concerns for confusion, fall today.  Patient reports that he was using a ladder to try to put fake snakes over the carport to prevent birds from pooping on his car.  He states that he fell off a ladder 3 times.  He is unable to tell me why he fell or what happened that caused the fall.  He is unable to tell me how many feet he was up before he fell.  He does state that he hit his head but did not lose consciousness.  No blood thinner use.  Complaining of pain to the right side of the mid and lower back but no midline spinal pain.  Does appear that he had traumatic injury and loss of the left second toenail.  He denies numbness, tingling or weakness.  Mother reports that he called her around 75 PM and she noticed he had slurred speech.  He states that this started after the fall.  No known history of stroke.  He denies drug or alcohol use.  No fevers, cough, vomiting, diarrhea, chest pain or shortness of breath.  History provided by patient and mother at bedside.  ROS: See HPI Constitutional: no fever  Eyes: no drainage  ENT: no runny nose   Cardiovascular:  no chest pain  Resp: no SOB  GI: no vomiting GU: no dysuria Integumentary: no rash  Allergy: no hives  Musculoskeletal: no leg swelling  Neurological: + slurred speech ROS otherwise negative  PAST MEDICAL HISTORY/PAST SURGICAL HISTORY:  Past Medical History:  Diagnosis Date  . ANXIETY 07/19/2007  . DEPRESSION 07/19/2007  . HEMORRHOIDS, INTERNAL 12/01/2008  . HYPERLIPIDEMIA 07/19/2007  . HYPERTENSION 07/19/2007  . Sleep apnea     MEDICATIONS:  Prior to Admission medications   Medication Sig Start Date End Date Taking? Authorizing Provider  alprazolam Prudy Feeler) 2 MG tablet Take 2 mg by mouth 3  (three) times daily as needed for sleep. Prescribed by Dr.Kaur    [provider]  amphetamine-dextroamphetamine (ADDERALL) 30 MG tablet Take 30 mg by mouth 3 (three) times daily. Prescribed by Dr. Evelene Croon    [provider]  cyclobenzaprine (FLEXERIL) 10 MG tablet Take 1 tablet (10 mg total) by mouth 3 (three) times daily as needed for muscle spasms. 08/14/19   Wynn Banker, MD  divalproex (DEPAKOTE ER) 500 MG 24 hr tablet Take 500 mg by mouth 2 (two) times daily. 01/25/18   [provider]  Eszopiclone (ESZOPICLONE) 3 MG TABS Take 1 tablet (3 mg total) by mouth at bedtime. Take immediately before bedtime 01/07/19   Theodis Shove C, MD  FLUoxetine (PROZAC) 40 MG capsule 60 mg.  02/18/16   [provider]  gabapentin (NEURONTIN) 800 MG tablet Take 1 tablet by mouth 4 (four) times daily. 01/22/18   [provider]  levothyroxine (SYNTHROID) 112 MCG tablet TAKE 1 TABLET BY MOUTH EVERY DAY 01/13/20   Wynn Banker, MD  Melatonin 5 MG CAPS Take 1 capsule (5 mg total) by mouth at bedtime as needed. 01/07/19   Koberlein, Paris Lore, MD  meloxicam (MOBIC) 15 MG tablet TAKE ONE TABLET BY MOUTH DAILY 01/16/20   Koberlein, Jannette Spanner C, MD  triamcinolone ointment (KENALOG) 0.5 % Apply 1 application topically 2 (two) times daily. 01/07/19   Koberlein, Paris Lore,  MD  valACYclovir (VALTREX) 500 MG tablet Take 1 tablet (500 mg total) by mouth 2 (two) times daily. Patient taking differently: Take 500 mg by mouth 2 (two) times daily as needed (Herpes).  05/14/15   Ashok Norris, MD    ALLERGIES:  Allergies  Allergen Reactions  . Tramadol     ?serotonin syndrome    SOCIAL HISTORY:  Social History   Tobacco Use  . Smoking status: Former Smoker    Quit date: 12/06/2011    Years since quitting: 8.3  . Smokeless tobacco: Never Used  Substance Use Topics  . Alcohol use: Yes    Alcohol/week: 1.0 standard drinks    Types: 1 Cans of beer per week    FAMILY  HISTORY: Family History  Problem Relation Age of Onset  . Hypertension Father   . Anxiety disorder Father   . Obesity Mother   . Sleep apnea Mother   . Rheum arthritis Sister   . Alzheimer's disease Maternal Grandmother   . Stroke Maternal Grandfather   . Alzheimer's disease Paternal Grandmother   . Pancreatic cancer Paternal Grandfather     EXAM: BP (!) 140/94 (BP Location: Right Arm)   Pulse 83   Temp 98.2 F (36.8 C) (Oral)   Resp 19   Ht 6\' 3"  (1.905 m)   Wt (!) 170 kg   SpO2 97%   BMI 46.84 kg/m  CONSTITUTIONAL: Alert and oriented x4 and responds appropriately to questions. Well-appearing; well-nourished; GCS 15, obese HEAD: Normocephalic; abrasion to the posterior scalp EYES: Conjunctivae clear, PERRL, EOMI ENT: normal nose; no rhinorrhea; moist mucous membranes; pharynx without lesions noted; no dental injury; no septal hematoma NECK: Supple, no meningismus, no LAD; no midline spinal tenderness, step-off or deformity; trachea midline CARD: RRR; S1 and S2 appreciated; no murmurs, no clicks, no rubs, no gallops RESP: Normal chest excursion without splinting or tachypnea; breath sounds clear and equal bilaterally; no wheezes, no rhonchi, no rales; no hypoxia or respiratory distress CHEST:  chest wall stable, no crepitus or ecchymosis or deformity, nontender to palpation; no flail chest ABD/GI: Normal bowel sounds; non-distended; soft, non-tender, no rebound, no guarding; no ecchymosis or other lesions noted PELVIS:  stable, nontender to palpation BACK: Patient has an abrasion to the left posterior shoulder.  He has tenderness over the right thoracic and lumbar paraspinal muscles but no midline spinal tenderness or step-off or deformity. EXT: Normal ROM in all joints; has had traumatic injury with removal of the toenail to the left second toe with blood noted without active bleeding, 2+ left DP pulse, extremities are non-tender to palpation; no edema; normal capillary refill;  no cyanosis, no bony tenderness or bony deformity of patient's extremities, no joint effusion, compartments are soft, extremities are warm and well-perfused, no ecchymosis SKIN: Normal color for age and race; warm NEURO: Moves all extremities equally, normal sensation diffusely, cranial nerves II through XII intact, dysarthric speech without aphasia PSYCH: The patient's mood and manner are appropriate. Grooming and personal hygiene are appropriate.  MEDICAL DECISION MAKING: Patient here with altered mental status, confusion and slurred speech after a fall.  CT head obtained in triage has been reviewed/interpreted and shows no acute abnormality.  Will obtain MRI of the brain to rule out stroke or other acute abnormality.  Could also be postconcussive syndrome.  NIH stroke scale is 1.  Outside TPA window at this time.  No infectious symptoms.  He does not currently appear intoxicated and other than slurred speech is neurologically intact.  Labs reviewed/interpreted do show acute kidney injury with creatinine of 2.4.  Last for comparison was in February 2020 and was normal.  Have recommended that we obtain a urinalysis at this time, ethanol panel, urine drug screen, MRI of the brain, x-rays of the thoracic and lumbar spine x-ray of the left foot.  ED PROGRESS: Patient's urine shows no sign of infection.  Drug screen positive for benzodiazepines.  He has prescribed Xanax.  Previously on Depakote.  Will check Depakote level.  MRI of the brain pending.  X-rays pending.  Will discuss with hospitalist for admission.  Getting IV fluids for his acute kidney injury.  BUN normal suggesting against uremia causing encephalopathy.  5:52 AM Discussed patient's case with hospitalist, Dr. Julian Reil.  I have recommended admission and patient (and family if present) agree with this plan. Admitting physician will place admission orders.   I reviewed all nursing notes, vitals, pertinent previous records and reviewed/interpreted  all EKGs, lab and urine results, imaging (as available).   MRI brain shows no acute abnormality.  Covid negative.  X-ray of the thoracic spine shows slight anterior wedging of the lower thoracic vertebral body at T11 that could be an acute vertebral body fracture.  He is not having any significant midline tenderness at this area.  L-spine x-ray normal.  Left foot x-ray normal.    Brian Rowe was evaluated in Emergency Department on 04/05/2020 for the symptoms described in the history of present illness. He was evaluated in the context of the global COVID-19 pandemic, which necessitated consideration that the patient might be at risk for infection with the SARS-CoV-2 virus that causes COVID-19. Institutional protocols and algorithms that pertain to the evaluation of patients at risk for COVID-19 are in a state of rapid change based on information released by regulatory bodies including the CDC and federal and state organizations. These policies and algorithms were followed during the patient's care in the ED.       Brian Rowe, Layla Maw, DO 04/05/20 [phone]

## 2020-04-05 NOTE — ED Notes (Signed)
Dr Margo Aye called spoke with doctor patient ready to leave and pulled IV out and got dressed. Patient Brian Rowe. Dr Margo Aye talking with patient on phone.

## 2020-04-06 ENCOUNTER — Other Ambulatory Visit: Payer: Self-pay | Admitting: Family Medicine

## 2020-04-07 ENCOUNTER — Telehealth: Payer: Self-pay | Admitting: Family Medicine

## 2020-04-07 ENCOUNTER — Other Ambulatory Visit: Payer: Self-pay | Admitting: Family Medicine

## 2020-04-07 DIAGNOSIS — E039 Hypothyroidism, unspecified: Secondary | ICD-10-CM

## 2020-04-07 DIAGNOSIS — N179 Acute kidney failure, unspecified: Secondary | ICD-10-CM

## 2020-04-07 DIAGNOSIS — E559 Vitamin D deficiency, unspecified: Secondary | ICD-10-CM

## 2020-04-07 DIAGNOSIS — E785 Hyperlipidemia, unspecified: Secondary | ICD-10-CM

## 2020-04-07 DIAGNOSIS — Z23 Encounter for immunization: Secondary | ICD-10-CM | POA: Diagnosis not present

## 2020-04-07 DIAGNOSIS — I10 Essential (primary) hypertension: Secondary | ICD-10-CM

## 2020-04-07 NOTE — Telephone Encounter (Signed)
*  his head looked ok on imaging, but with regards to kidney function, he left AMA from the ER when admission was recommended and they were going to have the kidney specialist evaluate him while there?   *I am ok with adding to same day slots, but last visit was no show in early April and he did not follow up for CCV as previously directed, so please just advise he needs to make sure he keeps that appointment. I am also ok with taking just one same day slot so as not to block whole schedule, but let him know that we will likely need to do a short term follow up after this.   *I will also order a repeat bloodwork that I would like him to complete before the end of this week. NOT fasting. Hydrate well with water. Avoid any anti-inflammatories like ibuprofen or motrin that will negatively affect kidney function. We will review bloodwork at appointment then on Monday. Theodis Shove, MD

## 2020-04-07 NOTE — Telephone Encounter (Signed)
Pt stated he fell Saturday May 1st and hit the back of his head on concrete. He went to the ED room after it happened. They ran test such as MRI, x-ray, and blood. They informed him that his kidney is elevated but did not say if anything was wrong with his head. He is needing to do a hospital follow up with Koberlein within a week but she does not have any in office availability. Wondering if the pt can be placed in the same day slot on Monday May 10th at 10:30 am?  Pt is also wondering if he needs to come in to do blood work for his Kidney levels?    Pt can be reached at [phone]

## 2020-04-08 ENCOUNTER — Telehealth: Payer: Self-pay | Admitting: Family Medicine

## 2020-04-08 NOTE — Telephone Encounter (Signed)
Spoke with the pt and informed him of the message below.  Patient questioned what was wrong with his kidneys and I advised of the phone message below in which he asked if he could have labs and the response from PCP.  Offered to schedule a lab appt this week and the appt on Monday and the pt stated he will have to check with his wife and call back.  Message sent to PCP.

## 2020-04-08 NOTE — Telephone Encounter (Signed)
Noted  

## 2020-04-08 NOTE — ED Provider Notes (Signed)
[facility] EMERGENCY DEPARTMENT Provider Note   CSN: [id number] Arrival date & time: 04/05/20  1558     History Chief Complaint  Patient presents with  . Follow-up    Brian Rowe is a 44 y.o. male.  HPI   43yM presenting for evaluation of fatigues/slurred speech. Just seen in the ED after 3 falls from ladder. He estimates he was 3-4 feet off the ground each time. Not sure if he hit his head or not. Denies headache but has felt very fatigued. Speech has been slow/slurred. Renal impairment noted on work-up and plan was for admission but then he changes his mind. He reconsidered once he left and came back. Denies new symptoms since last seen.    Patient Active Problem List   Diagnosis Date Noted  . AKI (acute kidney injury) (HCC) 04/05/2020  . Slurred speech 04/05/2020  . ADD (attention deficit disorder) 01/07/2019  . Bilateral carpal tunnel syndrome 10/13/2015  . Hypothyroidism 05/29/2015  . Degenerative arthritis of left ankle 12/30/2014  . Tendonitis, Achilles, right 12/30/2014  . Obesity, morbid (HCC) 08/04/2014  . OSA on CPAP 08/04/2014  . Thoracic back pain 07/23/2012  . HEMORRHOIDS, INTERNAL 12/01/2008  . VISUAL IMPAIRMENT 10/15/2007  . HEMATURIA 10/03/2007  . Dyslipidemia 07/19/2007  . Anxiety 07/19/2007  . DEPRESSION 07/19/2007  . Essential hypertension 07/19/2007  . LOW BACK PAIN 07/19/2007   Past Surgical History:  Procedure Laterality Date  . left ankle Left   . NASAL SEPTUM SURGERY    . TONSILLECTOMY    . UVULOPALATOPHARYNGOPLASTY (UPPP)/TONSILLECTOMY/SEPTOPLASTY       Family History  Problem Relation Age of Onset  . Hypertension Father   . Anxiety disorder Father   . Obesity Mother   . Sleep apnea Mother   . Rheum arthritis Sister   . Alzheimer's disease Maternal Grandmother   . Stroke Maternal Grandfather   . Alzheimer's disease Paternal Grandmother   . Pancreatic cancer Paternal Grandfather    Social History   Tobacco  Use  . Smoking status: Former Smoker    Quit date: 12/06/2011    Years since quitting: 8.3  . Smokeless tobacco: Never Used  Substance Use Topics  . Alcohol use: Yes    Alcohol/week: 1.0 standard drinks    Types: 1 Cans of beer per week  . Drug use: No    Home Medications Prior to Admission medications   Medication Sig Start Date End Date Taking? Authorizing Provider  alprazolam Prudy Feeler) 2 MG tablet Take 2 mg by mouth 3 (three) times daily as needed for sleep. Prescribed by Dr.Kaur    [provider]  Eszopiclone (ESZOPICLONE) 3 MG TABS Take 1 tablet (3 mg total) by mouth at bedtime. Take immediately before bedtime 01/07/19   Koberlein, Jannette Spanner C, MD  FLUoxetine (PROZAC) 40 MG capsule Take 40 mg by mouth daily.  02/18/16   [provider]  levothyroxine (SYNTHROID) 112 MCG tablet TAKE 1 TABLET BY MOUTH EVERY DAY 04/06/20   Wynn Banker, MD    Allergies    Tramadol  Review of Systems   Review of Systems All systems reviewed and negative, other than as noted in HPI.  Physical Exam Updated Vital Signs BP 105/68   Pulse 67   Temp (!) 97.3 F (36.3 C) (Oral)   Resp (!) 22   SpO2 96%   Physical Exam Vitals and nursing note reviewed.  Constitutional:      General: He is not in acute distress.  Appearance: He is well-developed. He is obese.  HENT:     Head: Normocephalic and atraumatic.  Eyes:     General:        Right eye: No discharge.        Left eye: No discharge.     Conjunctiva/sclera: Conjunctivae normal.  Cardiovascular:     Rate and Rhythm: Normal rate and regular rhythm.     Heart sounds: Normal heart sounds. No murmur. No friction rub. No gallop.   Pulmonary:     Effort: Pulmonary effort is normal. No respiratory distress.     Breath sounds: Normal breath sounds.  Abdominal:     General: There is no distension.     Palpations: Abdomen is soft.     Tenderness: There is no abdominal tenderness.  Musculoskeletal:        General: No  tenderness.     Cervical back: Neck supple.  Skin:    General: Skin is warm and dry.  Neurological:     Mental Status: He is alert and oriented to person, place, and time.     Cranial Nerves: No cranial nerve deficit.     Sensory: No sensory deficit.     Motor: No weakness.     Coordination: Coordination normal.     Comments: Speech is slow but answering all questions appropriately. AOx4. No focal deficits.   Psychiatric:        Behavior: Behavior normal.        Thought Content: Thought content normal.     ED Results / Procedures / Treatments   Labs (all labs ordered are listed, but only abnormal results are displayed) Labs Reviewed  BASIC METABOLIC PANEL - Abnormal; Notable for the following components:      Result Value   Creatinine, Ser 2.22 (*)    Calcium 8.5 (*)    GFR calc non Af Amer 35 (*)    GFR calc Af Amer 41 (*)    All other components within normal limits    EKG None  Radiology No results found.  Procedures Procedures (including critical care time)  Medications Ordered in ED Medications  sodium chloride 0.9 % bolus 1,000 mL (0 mLs Intravenous Stopped 04/05/20 2109)    ED Course  I have reviewed the triage vital signs and the nursing notes.  Pertinent labs & imaging results that were available during my care of the patient were reviewed by me and considered in my medical decision making (see chart for details).    MDM Rules/Calculators/A&P                     43yM with continued fatigue and not himself. Post-concussive syndrome? CT and MRI earlier w/o acute abnormality. Renal impairment stable and I suspect likely chronic to some degree although changes from prior labs over a year ago. He takes meloxicam regularly. Advised to stop all NSAIDs. I think at this point he is fine to follow-up with neprhology as an outpatient.   Final Clinical Impression(s) / ED Diagnoses Final diagnoses:  Renal impairment  Fatigue, unspecified type    Rx / DC  Orders ED Discharge Orders    None       Virgel Manifold, MD 04/08/20 [phone]

## 2020-04-08 NOTE — Telephone Encounter (Signed)
  In case he calls back you can relay:  Hospital not sure what is going on with kidneys which is why they wanted specialty evaluation and why I went ahead and ordered repeat bloodwork. Might be related to muscle injury with fall which can cause strain on kidneys, but further eval is needed to find cause. All we know is that they are not filtering as well as they should be and that we need to monitor so that he does not lose kidney function.

## 2020-04-08 NOTE — Telephone Encounter (Signed)
Pt would like to know if the appt is still available that joanne offer please advise

## 2020-04-08 NOTE — Telephone Encounter (Signed)
Spoke with the pt and informed him of the appt on Monday to arrive at 10:15am.  Message sent to [facility] to schedule appt due to same day slot.  Lab appt scheduled for 5/7 per pts preference.

## 2020-04-08 NOTE — Telephone Encounter (Signed)
Spoke with the pt and informed him of the message below.  Lab appt scheduled for 5/7.

## 2020-04-09 ENCOUNTER — Other Ambulatory Visit: Payer: Self-pay

## 2020-04-10 ENCOUNTER — Other Ambulatory Visit (INDEPENDENT_AMBULATORY_CARE_PROVIDER_SITE_OTHER): Payer: Medicare Other

## 2020-04-10 ENCOUNTER — Other Ambulatory Visit: Payer: Self-pay

## 2020-04-10 DIAGNOSIS — I10 Essential (primary) hypertension: Secondary | ICD-10-CM

## 2020-04-10 DIAGNOSIS — E039 Hypothyroidism, unspecified: Secondary | ICD-10-CM | POA: Diagnosis not present

## 2020-04-10 DIAGNOSIS — N179 Acute kidney failure, unspecified: Secondary | ICD-10-CM | POA: Diagnosis not present

## 2020-04-10 DIAGNOSIS — E785 Hyperlipidemia, unspecified: Secondary | ICD-10-CM

## 2020-04-10 LAB — COMPREHENSIVE METABOLIC PANEL
ALT: 45 U/L (ref 0–53)
AST: 23 U/L (ref 0–37)
Albumin: 4.6 g/dL (ref 3.5–5.2)
Alkaline Phosphatase: 65 U/L (ref 39–117)
BUN: 12 mg/dL (ref 6–23)
CO2: 29 mEq/L (ref 19–32)
Calcium: 9.5 mg/dL (ref 8.4–10.5)
Chloride: 100 mEq/L (ref 96–112)
Creatinine, Ser: 1.22 mg/dL (ref 0.40–1.50)
GFR: 65.02 mL/min (ref >60.00)
Glucose, Bld: 81 mg/dL (ref 70–99)
Potassium: 4.1 mEq/L (ref 3.5–5.1)
Sodium: 137 mEq/L (ref 135–145)
Total Bilirubin: 1 mg/dL (ref 0.2–1.2)
Total Protein: 8 g/dL (ref 6.0–8.3)

## 2020-04-10 LAB — LIPID PANEL
Cholesterol: 207 mg/dL — ABNORMAL HIGH (ref 0–200)
HDL: 30.7 mg/dL — ABNORMAL LOW (ref >39.00)
NonHDL: 176.47
Total CHOL/HDL Ratio: 7
Triglycerides: 226 mg/dL — ABNORMAL HIGH (ref 0.0–149.0)
VLDL: 45.2 mg/dL — ABNORMAL HIGH (ref 0.0–40.0)

## 2020-04-10 LAB — CK: Total CK: 110 U/L (ref 7–232)

## 2020-04-10 LAB — TSH: TSH: 7.91 u[IU]/mL — ABNORMAL HIGH (ref 0.35–4.50)

## 2020-04-10 LAB — LDL CHOLESTEROL, DIRECT: Direct LDL: 141 mg/dL

## 2020-04-13 ENCOUNTER — Encounter: Payer: Self-pay | Admitting: Family Medicine

## 2020-04-13 ENCOUNTER — Other Ambulatory Visit: Payer: Self-pay

## 2020-04-13 ENCOUNTER — Ambulatory Visit (INDEPENDENT_AMBULATORY_CARE_PROVIDER_SITE_OTHER): Payer: Medicare Other | Admitting: Family Medicine

## 2020-04-13 VITALS — BP 138/90 | HR 73 | Temp 98.3°F | Ht 75.0 in | Wt 348.6 lb

## 2020-04-13 DIAGNOSIS — E039 Hypothyroidism, unspecified: Secondary | ICD-10-CM | POA: Diagnosis not present

## 2020-04-13 DIAGNOSIS — F419 Anxiety disorder, unspecified: Secondary | ICD-10-CM

## 2020-04-13 DIAGNOSIS — I10 Essential (primary) hypertension: Secondary | ICD-10-CM

## 2020-04-13 NOTE — Patient Instructions (Signed)
Call back of insurance card to get list of approved therapists.   Check out: "insight timer" app for phone

## 2020-04-13 NOTE — Progress Notes (Signed)
Brian Rowe DOB: [birth year] Encounter date: 04/13/2020  This is a 44 y.o. male who presents with Chief Complaint  Patient presents with  . Hospitalization Follow-up    History of present illness: Patient in the ER on 5/2 after having 3 falls from a ladder diagnosed with concussion.  Imaging of head done in the ER was normal/negative.  There was concern for elevated serum creatinine, and process of admission was started, but patient left AMA.  He did repeat blood work that I had ordered on 5/7 and kidney function had returned to normal at that time.  (2.23 down to 1.22).  Per ER documentation, patient was taking anti-inflammatories on a regular basis.  Hasn't been taking his synthroid as prescribed. Just forgets to take.   Has been separated from wife for a year. Daughter is 34 yrs old. Follows with Dr. Evelene Croon. Prescribed Prozac and he tries to take this and not take the xanax. Doesn't feel like the prozac helps; just feels badly.  Allergies  Allergen Reactions  . Tramadol     ?serotonin syndrome   Current Meds  Medication Sig  . alprazolam (XANAX) 2 MG tablet Take 2 mg by mouth 3 (three) times daily as needed for sleep. Prescribed by Dr.Kaur  . levothyroxine (SYNTHROID) 112 MCG tablet TAKE 1 TABLET BY MOUTH EVERY DAY (Patient taking differently: Take 112 mcg by mouth daily before breakfast. )  . [DISCONTINUED] FLUoxetine (PROZAC) 40 MG capsule Take 40 mg by mouth daily.     Review of Systems  Constitutional: Negative for chills, fatigue and fever.  Respiratory: Negative for cough, chest tightness, shortness of breath and wheezing.   Cardiovascular: Negative for chest pain, palpitations and leg swelling.  Skin:       Resolving ecchymoses right arm  Neurological: Negative for dizziness, weakness, light-headedness and headaches.    Objective:  BP 138/90 (BP Location: Left Arm, Patient Position: Sitting, Cuff Size: Large)   Pulse 73   Temp 98.3 F (36.8 C) (Temporal)   Ht 6'  3" (1.905 m)   Wt (!) 348 lb 9.6 oz (158.1 kg)   SpO2 97%   BMI 43.57 kg/m   Weight: (!) 348 lb 9.6 oz (158.1 kg)   BP Readings from Last 3 Encounters:  04/15/20 135/81  04/13/20 138/90  04/05/20 105/68   Wt Readings from Last 3 Encounters:  04/15/20 (!) 374 lb 12.5 oz (170 kg)  04/13/20 (!) 348 lb 9.6 oz (158.1 kg)  04/04/20 (!) 374 lb 12.5 oz (170 kg)    Physical Exam Constitutional:      General: He is not in acute distress.    Appearance: He is well-developed.  Cardiovascular:     Rate and Rhythm: Normal rate and regular rhythm.     Heart sounds: Normal heart sounds. No murmur. No friction rub.  Pulmonary:     Effort: Pulmonary effort is normal. No respiratory distress.     Breath sounds: Normal breath sounds. No wheezing or rales.  Musculoskeletal:     Right lower leg: No edema.     Left lower leg: No edema.  Neurological:     Mental Status: He is alert and oriented to person, place, and time.  Psychiatric:        Attention and Perception: Attention normal.        Mood and Affect: Mood is anxious.        Speech: Speech normal.        Behavior: Behavior normal. Behavior is cooperative.  Thought Content: Thought content normal.        Cognition and Memory: Cognition normal.     Assessment/Plan 1. Acquired hypothyroidism He has not been taking medication regularly.  He plans to start doing this and we will recheck thyroid levels in about 8 weeks time. - TSH; Future  2. Essential hypertension Encouraged to monitor at home.  Slightly higher than desired today, will make sure to keep close for follow-up. - Basic metabolic panel; Future  3. Anxiety He does see psychiatry, but I think it would also be helpful for him to have a therapist.  He is very agreeable to working with a therapist to try to find skills to help manage anxiety.  He would like to take less medication.  We had a long discussion today about mindful thinking and anxiety.  We discussed the  Insight timer app for his phone where he can try free meditations.  He has tried YouTube yoga and this is also been helpful for him.  I encouraged him to continue this.  We discussed importance of regular exercise and how helpful that is from an anxiety standpoint.   Reviewed hospital blood work and imaging results together.  His kidney function has normalized.  I suspect that he had strain secondary to muscle injury from the fall in combination with anti-inflammatory use.  He has stopped all anti-inflammatories and is also working on staying well-hydrated.  We will recheck to make sure that kidney function stable when we get future blood work.   Return in about 3 months (around 07/14/2020) for Chronic condition visit (BUT bloodwork in 8 weeks).   Visit, anxiety and anxiety management tools discussion, review of blood work together, review of hospital notes/lab work/imaging, and personal charting over 40 minutes time.    Micheline Rough, MD

## 2020-04-15 ENCOUNTER — Encounter (HOSPITAL_COMMUNITY): Payer: Self-pay | Admitting: Emergency Medicine

## 2020-04-15 ENCOUNTER — Other Ambulatory Visit: Payer: Self-pay

## 2020-04-15 ENCOUNTER — Emergency Department (HOSPITAL_COMMUNITY)
Admission: EM | Admit: 2020-04-15 | Discharge: 2020-04-15 | Disposition: A | Discharge status: Other Acute Inpatient Hospital | Payer: Medicare Other | Attending: Emergency Medicine | Admitting: Emergency Medicine

## 2020-04-15 ENCOUNTER — Inpatient Hospital Stay (HOSPITAL_COMMUNITY)
Admission: AD | Admit: 2020-04-15 | Discharge: 2020-04-17 | DRG: 885 | Disposition: A | Discharge status: Home / Self Care | Payer: Medicare Other | Source: Intra-hospital | Attending: Psychiatry | Admitting: Psychiatry

## 2020-04-15 ENCOUNTER — Encounter (HOSPITAL_COMMUNITY): Payer: Self-pay | Admitting: Behavioral Health

## 2020-04-15 DIAGNOSIS — F410 Panic disorder [episodic paroxysmal anxiety] without agoraphobia: Secondary | ICD-10-CM | POA: Diagnosis present

## 2020-04-15 DIAGNOSIS — F332 Major depressive disorder, recurrent severe without psychotic features: Secondary | ICD-10-CM | POA: Insufficient documentation

## 2020-04-15 DIAGNOSIS — Z811 Family history of alcohol abuse and dependence: Secondary | ICD-10-CM

## 2020-04-15 DIAGNOSIS — Z9089 Acquired absence of other organs: Secondary | ICD-10-CM | POA: Diagnosis not present

## 2020-04-15 DIAGNOSIS — Z8349 Family history of other endocrine, nutritional and metabolic diseases: Secondary | ICD-10-CM

## 2020-04-15 DIAGNOSIS — Z79899 Other long term (current) drug therapy: Secondary | ICD-10-CM | POA: Diagnosis not present

## 2020-04-15 DIAGNOSIS — Z20822 Contact with and (suspected) exposure to covid-19: Secondary | ICD-10-CM | POA: Insufficient documentation

## 2020-04-15 DIAGNOSIS — K5900 Constipation, unspecified: Secondary | ICD-10-CM | POA: Diagnosis present

## 2020-04-15 DIAGNOSIS — G4700 Insomnia, unspecified: Secondary | ICD-10-CM | POA: Diagnosis present

## 2020-04-15 DIAGNOSIS — I10 Essential (primary) hypertension: Secondary | ICD-10-CM | POA: Insufficient documentation

## 2020-04-15 DIAGNOSIS — Z885 Allergy status to narcotic agent status: Secondary | ICD-10-CM

## 2020-04-15 DIAGNOSIS — Z820 Family history of epilepsy and other diseases of the nervous system: Secondary | ICD-10-CM | POA: Diagnosis not present

## 2020-04-15 DIAGNOSIS — Z8261 Family history of arthritis: Secondary | ICD-10-CM

## 2020-04-15 DIAGNOSIS — Z823 Family history of stroke: Secondary | ICD-10-CM | POA: Diagnosis not present

## 2020-04-15 DIAGNOSIS — Z79890 Hormone replacement therapy (postmenopausal): Secondary | ICD-10-CM | POA: Diagnosis not present

## 2020-04-15 DIAGNOSIS — R45851 Suicidal ideations: Secondary | ICD-10-CM | POA: Diagnosis present

## 2020-04-15 DIAGNOSIS — E039 Hypothyroidism, unspecified: Secondary | ICD-10-CM | POA: Insufficient documentation

## 2020-04-15 DIAGNOSIS — Z800 Family history of malignant neoplasm of digestive organs: Secondary | ICD-10-CM

## 2020-04-15 DIAGNOSIS — G4730 Sleep apnea, unspecified: Secondary | ICD-10-CM | POA: Diagnosis present

## 2020-04-15 DIAGNOSIS — Z635 Disruption of family by separation and divorce: Secondary | ICD-10-CM

## 2020-04-15 DIAGNOSIS — Z87891 Personal history of nicotine dependence: Secondary | ICD-10-CM | POA: Insufficient documentation

## 2020-04-15 DIAGNOSIS — F339 Major depressive disorder, recurrent, unspecified: Principal | ICD-10-CM | POA: Diagnosis present

## 2020-04-15 DIAGNOSIS — Z818 Family history of other mental and behavioral disorders: Secondary | ICD-10-CM | POA: Diagnosis not present

## 2020-04-15 DIAGNOSIS — Z8249 Family history of ischemic heart disease and other diseases of the circulatory system: Secondary | ICD-10-CM | POA: Diagnosis not present

## 2020-04-15 DIAGNOSIS — F419 Anxiety disorder, unspecified: Secondary | ICD-10-CM | POA: Insufficient documentation

## 2020-04-15 DIAGNOSIS — Z03818 Encounter for observation for suspected exposure to other biological agents ruled out: Secondary | ICD-10-CM | POA: Diagnosis not present

## 2020-04-15 DIAGNOSIS — F329 Major depressive disorder, single episode, unspecified: Secondary | ICD-10-CM | POA: Diagnosis present

## 2020-04-15 LAB — COMPREHENSIVE METABOLIC PANEL
ALT: 65 U/L — ABNORMAL HIGH (ref 0–44)
AST: 30 U/L (ref 15–41)
Albumin: 4.5 g/dL (ref 3.5–5.0)
Alkaline Phosphatase: 54 U/L (ref 38–126)
Anion gap: 13 (ref 5–15)
BUN: 10 mg/dL (ref 6–20)
CO2: 26 mmol/L (ref 22–32)
Calcium: 9.3 mg/dL (ref 8.9–10.3)
Chloride: 98 mmol/L (ref 98–111)
Creatinine, Ser: 1.36 mg/dL — ABNORMAL HIGH (ref 0.61–1.24)
GFR calc Af Amer: >60 mL/min (ref >60)
GFR calc non Af Amer: >60 mL/min (ref >60)
Glucose, Bld: 84 mg/dL (ref 70–99)
Potassium: 4.2 mmol/L (ref 3.5–5.1)
Sodium: 137 mmol/L (ref 135–145)
Total Bilirubin: 1.5 mg/dL — ABNORMAL HIGH (ref 0.3–1.2)
Total Protein: 8 g/dL (ref 6.5–8.1)

## 2020-04-15 LAB — RAPID URINE DRUG SCREEN, HOSP PERFORMED
Amphetamines: NOT DETECTED
Barbiturates: NOT DETECTED
Benzodiazepines: POSITIVE — AB
Cocaine: NOT DETECTED
Opiates: NOT DETECTED
Tetrahydrocannabinol: NOT DETECTED

## 2020-04-15 LAB — CBC
HCT: 50.7 % (ref 39.0–52.0)
Hemoglobin: 17.3 g/dL — ABNORMAL HIGH (ref 13.0–17.0)
MCH: 31.5 pg (ref 26.0–34.0)
MCHC: 34.1 g/dL (ref 30.0–36.0)
MCV: 92.2 fL (ref 80.0–100.0)
Platelets: 236 10*3/uL (ref 150–400)
RBC: 5.5 MIL/uL (ref 4.22–5.81)
RDW: 12.6 % (ref 11.5–15.5)
WBC: 6.5 10*3/uL (ref 4.0–10.5)
nRBC: 0 % (ref 0.0–0.2)

## 2020-04-15 LAB — ETHANOL: Alcohol, Ethyl (B): <10 mg/dL (ref <10)

## 2020-04-15 LAB — SARS CORONAVIRUS 2 BY RT PCR (HOSPITAL ORDER, PERFORMED IN ~~LOC~~ HOSPITAL LAB): SARS Coronavirus 2: NEGATIVE

## 2020-04-15 LAB — ACETAMINOPHEN LEVEL: Acetaminophen (Tylenol), Serum: <10 ug/mL — ABNORMAL LOW (ref 10–30)

## 2020-04-15 LAB — SALICYLATE LEVEL: Salicylate Lvl: <7 mg/dL — ABNORMAL LOW (ref 7.0–30.0)

## 2020-04-15 MED ORDER — ACETAMINOPHEN 500 MG PO TABS: 1000.0000 mg | ORAL_TABLET | Freq: Two times a day (BID) | ORAL | Status: DC | PRN

## 2020-04-15 MED ORDER — ACETAMINOPHEN 325 MG PO TABS
650.0000 mg | ORAL_TABLET | Freq: Four times a day (QID) | ORAL | Status: DC | PRN
  Administered 2020-04-16: 650 mg via ORAL
  Filled 2020-04-15 (×2): qty 2

## 2020-04-15 MED ORDER — ALUM & MAG HYDROXIDE-SIMETH 200-200-20 MG/5ML PO SUSP: 30.0000 mL | ORAL | Status: DC | PRN

## 2020-04-15 MED ORDER — SERTRALINE HCL 25 MG PO TABS
25.0000 mg | ORAL_TABLET | Freq: Every day | ORAL | Status: DC
  Administered 2020-04-16: 25 mg via ORAL
  Filled 2020-04-15 (×2): qty 1

## 2020-04-15 MED ORDER — ALPRAZOLAM 0.5 MG PO TABS
2.0000 mg | ORAL_TABLET | Freq: Two times a day (BID) | ORAL | Status: DC
  Administered 2020-04-15 – 2020-04-17 (×4): 2 mg via ORAL
  Filled 2020-04-15 (×4): qty 4

## 2020-04-15 MED ORDER — LEVOTHYROXINE SODIUM 112 MCG PO TABS
112.0000 ug | ORAL_TABLET | Freq: Every day | ORAL | Status: DC
  Administered 2020-04-16 – 2020-04-17 (×2): 112 ug via ORAL
  Filled 2020-04-15 (×5): qty 1

## 2020-04-15 MED ORDER — HYDROXYZINE HCL 25 MG PO TABS
25.0000 mg | ORAL_TABLET | Freq: Three times a day (TID) | ORAL | Status: DC | PRN
  Administered 2020-04-15: 25 mg via ORAL
  Filled 2020-04-15: qty 1

## 2020-04-15 MED ORDER — MAGNESIUM HYDROXIDE 400 MG/5ML PO SUSP
30.0000 mL | Freq: Every day | ORAL | Status: DC | PRN
  Administered 2020-04-16: 30 mL via ORAL

## 2020-04-15 MED ORDER — ALUM & MAG HYDROXIDE-SIMETH 200-200-20 MG/5ML PO SUSP: 30.0000 mL | Freq: Four times a day (QID) | ORAL | Status: DC | PRN

## 2020-04-15 MED ORDER — NICOTINE 21 MG/24HR TD PT24
21.0000 mg | MEDICATED_PATCH | Freq: Every day | TRANSDERMAL | Status: DC
  Filled 2020-04-15 (×5): qty 1

## 2020-04-15 MED ORDER — LEVOTHYROXINE SODIUM 112 MCG PO TABS
112.0000 ug | ORAL_TABLET | Freq: Every day | ORAL | Status: DC
  Administered 2020-04-15: 112 ug via ORAL
  Filled 2020-04-15: qty 1

## 2020-04-15 MED ORDER — ALPRAZOLAM 0.25 MG PO TABS: 2.0000 mg | ORAL_TABLET | Freq: Three times a day (TID) | ORAL | Status: DC | PRN

## 2020-04-15 MED ORDER — TRAZODONE HCL 50 MG PO TABS
50.0000 mg | ORAL_TABLET | Freq: Every evening | ORAL | Status: DC | PRN
  Administered 2020-04-15: 50 mg via ORAL
  Filled 2020-04-15: qty 1

## 2020-04-15 MED ORDER — ONDANSETRON HCL 4 MG PO TABS: 4.0000 mg | ORAL_TABLET | Freq: Three times a day (TID) | ORAL | Status: DC | PRN

## 2020-04-15 NOTE — H&P (Addendum)
Psychiatric Admission Assessment Adult  Patient Identification: Brian Rowe MRN:  [id number] Date of Evaluation:  04/15/2020 Chief Complaint: " I felt depressed "   Principal Diagnosis:  MDD Diagnosis:  MDD History of Present Illness: 44 year old male . Presented to ED on 5/12 reporting worsening  depression. On day of admission he developed suicidal ideations, with  thoughts of overdosing or shooting self. Currently describes them as fleeting, and states he was not experiencing any suicidal ideations prior to yesterday.  Explains he started to write a suicide note but decided to come to ED. He states he was also concerned because his sleep was poor and worried that sleep deprivation was going to cause him to feel worse. In addition to depression he also endorses increased tendency to worry recently. Endorses some neuro-vegetative symptoms as below.  Denies psychotic symptoms.  Does not endorse any specific stressors, triggers for worsening depression, although states that COVID epidemic and resulting social restrictions have contributed . He also reports he separated July 2019 and now lives alone  * Of note, patient had presented to falling from a ladder in early May, sustaining head injury. At the time a head CT scan was done- negative. He was also found to have elevated Creatinine at 2.42. ( Now improved to 1.36, GFR >60)  * Patient reports long term ( years ) management with Xanax, which is prescribed at 2 mgrs TID. States he last took Xanax prior to going to ED. At this time he is not presenting with BZD WDL symptoms- no tremors, no restlessness, vitals stable ( 135/81, pulse 60) . He does report some diaphoresis which he states is chronic, not related to WDL.  Associated Signs/Symptoms: Depression Symptoms:  depressed mood, anhedonia, insomnia, suicidal thoughts with specific plan, decreased appetite, (Hypo) Manic Symptoms:  None noted or endorsed  Anxiety Symptoms:  Reports increased  anxiety, states he worries excessively Psychotic Symptoms:  Denies  PTSD Symptoms: Does not endorse  Total Time spent with patient: 45 minutes  Past Psychiatric History: one prior psychiatric admission as a teenager in the 90's for " ADHD and anger problems". Denies history of suicide attempts or of self injurious behaviors. Denies history of psychosis. Reports past history of depression and of anxiety. Does not endorse clear history of mania or hypomania. Does state he was prescribed Adderall in the past for ADHD symptoms and stopped it because he felt " too accelerated". Characterizes anxiety mainly as worrying excessively, occasional panic attacks. Denies history of violence.   Is the patient at risk to self? Yes.    Has the patient been a risk to self in the past 6 months? No.  Has the patient been a risk to self within the distant past? No.  Is the patient a risk to others? No.  Has the patient been a risk to others in the past 6 months? No.  Has the patient been a risk to others within the distant past? No.   Prior Inpatient Therapy:  as above  Prior Outpatient Therapy:  Dr. Evelene Croon.   Alcohol Screening:   Substance Abuse History in the last 12 months: remote history of alcohol use disorder, but stopped several years ago. Denies history of drug abuse . Reports he has been on Xanax for several years, prescribed by his outpatient psychiatrist. Denies abusing or misusing. Consequences of Substance Abuse: Denies .  Previous Psychotropic Medications: Xanax 2 mgrs TID . States that he has been on this medication for years. States he  has been using regularly. ( Xanax dose verified with pharmacist by reviewing [place] Controlled Substance Registry- last dispensed 2 mgr tablets # 270 on 5/5) . Patient reports that on most days he takes TID dosing but sometimes takes BID, and does not notice any WDL symptoms or discomfort . He states he has been on Prozac in the past, but stopped it because it caused him  to feel more anxious. Psychological Evaluations: No  Past Medical History: HTN, Hyperlipidemia, history of Sleep Apnea, for which he uses a CPAP. He has also been diagnosed with Hypothyroidism in the past . Takes Synthroid 112 micrograms per day. Was managing HTN and hyperlipidemia were being managed with diet /exercize. Denies history of seizures . Reports allergy to Tramadol- describes possible serotonin syndrome when he took this medication.  Past Medical History:  Diagnosis Date  . ANXIETY 07/19/2007  . DEPRESSION 07/19/2007  . HEMORRHOIDS, INTERNAL 12/01/2008  . HYPERLIPIDEMIA 07/19/2007  . HYPERTENSION 07/19/2007  . Sleep apnea     Past Surgical History:  Procedure Laterality Date  . left ankle Left   . NASAL SEPTUM SURGERY    . TONSILLECTOMY    . UVULOPALATOPHARYNGOPLASTY (UPPP)/TONSILLECTOMY/SEPTOPLASTY     Family History: parents alive , live together. Has one brother and one sister Family History  Problem Relation Age of Onset  . Hypertension Father   . Anxiety disorder Father   . Obesity Mother   . Sleep apnea Mother   . Rheum arthritis Sister   . Alzheimer's disease Maternal Grandmother   . Stroke Maternal Grandfather   . Alzheimer's disease Paternal Grandmother   . Pancreatic cancer Paternal Grandfather    Family Psychiatric  History:  Reports father has history of anxiety. No suicides in family. Maternal grandfather and uncle have history of alcohol abuse . Tobacco Screening:  Does not smoke or vape  Social History: 33, separated, has a 31 year old daughter. Lives alone .States child's mother and him share custody . On disability. Social History   Substance and Sexual Activity  Alcohol Use Yes  . Alcohol/week: 1.0 standard drinks  . Types: 1 Cans of beer per week     Social History   Substance and Sexual Activity  Drug Use No    Additional Social History:  Allergies:   Allergies  Allergen Reactions  . Tramadol     ?serotonin syndrome   Lab Results:   Results for orders placed or performed during the hospital encounter of 04/15/20 (from the past 48 hour(s))  Rapid urine drug screen (hospital performed)     Status: Abnormal   Collection Time: 04/15/20  4:05 AM  Result Value Ref Range   Opiates NONE DETECTED NONE DETECTED   Cocaine NONE DETECTED NONE DETECTED   Benzodiazepines POSITIVE (A) NONE DETECTED   Amphetamines NONE DETECTED NONE DETECTED   Tetrahydrocannabinol NONE DETECTED NONE DETECTED   Barbiturates NONE DETECTED NONE DETECTED    Comment: (NOTE) DRUG SCREEN FOR MEDICAL PURPOSES ONLY.  IF CONFIRMATION IS NEEDED FOR ANY PURPOSE, NOTIFY LAB WITHIN 5 DAYS. LOWEST DETECTABLE LIMITS FOR URINE DRUG SCREEN Drug Class                     Cutoff (ng/mL) Amphetamine and metabolites    1000 Barbiturate and metabolites    200 Benzodiazepine                 200 Tricyclics and metabolites     300 Opiates and metabolites  300 Cocaine and metabolites        300 THC                            50 Performed at [facility] Lab, [address], [place], [place] [address]   SARS Coronavirus 2 by RT PCR (hospital order, performed in [facility] hospital lab) Nasopharyngeal Nasopharyngeal Swab     Status: None   Collection Time: 04/15/20  4:05 AM   Specimen: Nasopharyngeal Swab  Result Value Ref Range   SARS Coronavirus 2 NEGATIVE NEGATIVE    Comment: (NOTE) SARS-CoV-2 target nucleic acids are NOT DETECTED. The SARS-CoV-2 RNA is generally detectable in upper and lower respiratory specimens during the acute phase of infection. The lowest concentration of SARS-CoV-2 viral copies this assay can detect is 250 copies / mL. A negative result does not preclude SARS-CoV-2 infection and should not be used as the sole basis for treatment or other patient management decisions.  A negative result may occur with improper specimen collection / handling, submission of specimen other than nasopharyngeal swab, presence of viral mutation(s)  within the areas targeted by this assay, and inadequate number of viral copies (<250 copies / mL). A negative result must be combined with clinical observations, patient history, and epidemiological information. Fact Sheet for Patients:   BoilerBrush.com.cy Fact Sheet for Healthcare Providers: https://pope.com/ This test is not yet approved or cleared  by the [place] FDA and has been authorized for detection and/or diagnosis of SARS-CoV-2 by FDA under an Emergency Use Authorization (EUA).  This EUA will remain in effect (meaning this test can be used) for the duration of the COVID-19 declaration under Section 564(b)(1) of the Act, 21 U.S.C. section 360bbb-3(b)(1), unless the authorization is terminated or revoked sooner. Performed at [facility], [address], [place], [place] [address]   Comprehensive metabolic panel     Status: Abnormal   Collection Time: 04/15/20  4:15 AM  Result Value Ref Range   Sodium 137 135 - 145 mmol/L   Potassium 4.2 3.5 - 5.1 mmol/L   Chloride 98 98 - 111 mmol/L   CO2 26 22 - 32 mmol/L   Glucose, Bld 84 70 - 99 mg/dL    Comment: Glucose reference range applies only to samples taken after fasting for at least 8 hours.   BUN 10 6 - 20 mg/dL   Creatinine, Ser 7.02 (H) 0.61 - 1.24 mg/dL   Calcium 9.3 8.9 - 63.7 mg/dL   Total Protein 8.0 6.5 - 8.1 g/dL   Albumin 4.5 3.5 - 5.0 g/dL   AST 30 15 - 41 U/L   ALT 65 (H) 0 - 44 U/L   Alkaline Phosphatase 54 38 - 126 U/L   Total Bilirubin 1.5 (H) 0.3 - 1.2 mg/dL   GFR calc non Af Amer >60 >60 mL/min   GFR calc Af Amer >60 >60 mL/min   Anion gap 13 5 - 15    Comment: Performed at [facility], [address], [place], [place] [address]  Ethanol     Status: None   Collection Time: 04/15/20  4:15 AM  Result Value Ref Range   Alcohol, Ethyl (B) <10 <10 mg/dL    Comment: (NOTE) Lowest detectable limit for serum alcohol is 10 mg/dL. For medical  purposes only. Performed at [facility], [address], [place], [place] [address]   Salicylate level  Status: Abnormal   Collection Time: 04/15/20  4:15 AM  Result Value Ref Range   Salicylate Lvl <7.0 (L) 7.0 - 30.0 mg/dL    Comment: Performed at [facility], [address] [place], [place] [address]  Acetaminophen level     Status: Abnormal   Collection Time: 04/15/20  4:15 AM  Result Value Ref Range   Acetaminophen (Tylenol), Serum <10 (L) 10 - 30 ug/mL    Comment: (NOTE) Therapeutic concentrations vary significantly. A range of 10-30 ug/mL  may be an effective concentration for many patients. However, some  are best treated at concentrations outside of this range. Acetaminophen concentrations >150 ug/mL at 4 hours after ingestion  and >50 ug/mL at 12 hours after ingestion are often associated with  toxic reactions. Performed at [facility], [address], [place], [place] [address]   cbc     Status: Abnormal   Collection Time: 04/15/20  4:15 AM  Result Value Ref Range   WBC 6.5 4.0 - 10.5 K/uL   RBC 5.50 4.22 - 5.81 MIL/uL   Hemoglobin 17.3 (H) 13.0 - 17.0 g/dL   HCT 29.5 62.1 - 30.8 %   MCV 92.2 80.0 - 100.0 fL   MCH 31.5 26.0 - 34.0 pg   MCHC 34.1 30.0 - 36.0 g/dL   RDW 65.7 84.6 - 96.2 %   Platelets 236 150 - 400 K/uL   nRBC 0.0 0.0 - 0.2 %    Comment: Performed at [facility] Lab, [address] [place], [place] [address]    Blood Alcohol level:  Lab Results  Component Value Date   ETH <10 04/15/2020   ETH <10 04/05/2020    Metabolic Disorder Labs:  No results found for: HGBA1C, MPG No results found for: PROLACTIN Lab Results  Component Value Date   CHOL 207 (H) 04/10/2020   TRIG 226.0 (H) 04/10/2020   HDL 30.70 (L) 04/10/2020   CHOLHDL 7 04/10/2020   VLDL 45.2 (H) 04/10/2020   LDLCALC 183 (H) 01/07/2019    Current Medications: No current facility-administered medications for this encounter.   PTA  Medications: Medications Prior to Admission  Medication Sig Dispense Refill Last Dose  . acetaminophen (TYLENOL) 500 MG tablet Take 1,000 mg by mouth 2 (two) times daily as needed for mild pain.     Marland Kitchen alprazolam (XANAX) 2 MG tablet Take 2 mg by mouth 3 (three) times daily as needed for sleep. Prescribed by Dr.Kaur     . levothyroxine (SYNTHROID) 112 MCG tablet TAKE 1 TABLET BY MOUTH EVERY DAY (Patient taking differently: Take 112 mcg by mouth daily before breakfast. ) 30 tablet 0     Musculoskeletal: Strength & Muscle Tone: within normal limits- no tremors, no diaphoresis, no restlessness or agitation. Vitals are stable- temp 98.2, pulse 60, BP 135/81  Gait & Station: normal Patient leans: N/A  Psychiatric Specialty Exam: Physical Exam  Review of Systems  Constitutional: Negative.   HENT: Negative.   Eyes: Negative.   Respiratory: Negative.  Negative for cough and shortness of breath.   Cardiovascular: Negative.   Gastrointestinal: Negative.  Negative for diarrhea, nausea and vomiting.  Endocrine: Negative.   Genitourinary: Negative.   Musculoskeletal: Negative.   Skin: Negative for rash.  Allergic/Immunologic: Negative.   Neurological: Negative.  Negative for seizures and headaches.  Hematological: Negative.   Psychiatric/Behavioral: Positive for suicidal ideas.  He also reports long history of excessive sweating , which he states has been chronic ( *  is NOT associated with BZD WDL) and attributes to body weight.   There were no vitals taken for this visit.There is no height or weight on file to calculate BMI.  General Appearance: Fairly Groomed  Eye Contact:  Good  Speech:  Normal Rate  Volume:  Normal  Mood:  reports mood is " about the same".  Affect:  vaguely anxious, constricted. Does smile at times appropriately  Thought Process:  Linear and Descriptions of Associations: Intact  Orientation:  Other:  fully alert and attentive  Thought Content:  no hallucinations, no  delusions, not internally preoccupied   Suicidal Thoughts:  No currently denies suicidal or self injurious ideations , denies homicidal or violent ideations, contracts for safety on unit   Homicidal Thoughts:  No  Memory:  recent and remote grossly intact   Judgement:  Fair  Insight:  Fair  Psychomotor Activity:  normal, in no acute distress or discomfort. No tremors, no diaphoresis, no restlessness or agitation  Concentration:  Concentration: Good and Attention Span: Good  Recall:  Good  Fund of Knowledge:  Good  Language:  Good  Akathisia:  Negative  Handed:  Right  AIMS (if indicated):     Assets:  Communication Skills Desire for Improvement Social Support  ADL's:  Intact  Cognition:  WNL  Sleep:       Treatment Plan Summary: Daily contact with patient to assess and evaluate symptoms and progress in treatment, Medication management, Plan inpatient treatment  and medications as below  Observation Level/Precautions:  15 minute checks  Laboratory:  as needed   Psychotherapy: milieu, group therapy   Medications: patient reports he has been on Xanax 2 mgrs TID for years and was taking regularly up to admission. This has been verified with pharmacist via [place] controlled substance registry. Currently patient does not present with symptoms of BZD WDL and vitals are stable. We have reviewed concerns related to long term/open ended BZD management and have reviewed options. Currently not interested in detox , but is in agreement with initiating a gradual taper. Will resume Xanax at 2 mgrs BID.  Reports anxiety related to starting an SSRI , worries about possible side effects. Agrees to Zoloft trial. Side effects reviewed. Will start low dose to minimize possible side effects. Start Zoloft 25 mgrs QDAY    Consultations:  As needed  Discharge Concerns: -   Estimated LOS:3-4 days   Other:     Physician Treatment Plan for Primary Diagnosis:  Suicidal Ideations Long Term Goal(s): Improvement  in symptoms so as ready for discharge  Short Term Goals: Ability to identify changes in lifestyle to reduce recurrence of condition will improve, Ability to verbalize feelings will improve, Ability to disclose and discuss suicidal ideas, Ability to demonstrate self-control will improve, Ability to identify and develop effective coping behaviors will improve, Ability to maintain clinical measurements within normal limits will improve and Compliance with prescribed medications will improve  Physician Treatment Plan for Secondary Diagnosis: MDD , no Psychotic Features Long Term Goal(s): Improvement in symptoms so as ready for discharge  Short Term Goals: Ability to identify changes in lifestyle to reduce recurrence of condition will improve, Ability to verbalize feelings will improve, Ability to disclose and discuss suicidal ideas, Ability to demonstrate self-control will improve, Ability to identify and develop effective coping behaviors will improve, Ability to maintain clinical measurements within normal limits will improve and Compliance with prescribed medications will improve  I certify that inpatient services furnished can reasonably be expected to  improve the patient's condition.    Craige CottaFernando A Wladyslawa Disbro, MD 5/12/20214:00 PM

## 2020-04-15 NOTE — Progress Notes (Signed)
Patient ID: Brian Rowe, male   DOB: 08/22/1976, 44 y.o.   MRN: [id number]  Per nurse, patient now denies SI despite presenting to the ED only hours ago with chief complaint of SI and plan to shoot himself or overdose on pills. Per chart review, patient has access to a number of guns and he had written a suicide note to leave for his family (but threw it away). Patient has requested to be discharged and follow-up with his doctor on outpatient basis however, there remains concerns for safety. There are no changes in disposition and inpatient psychiatric hospitalization continues to be recommended.

## 2020-04-15 NOTE — Tx Team (Signed)
Initial Treatment Plan 04/15/2020 6:55 PM Brian Rowe [id number]    PATIENT STRESSORS: Financial difficulties Marital or family conflict Traumatic event   PATIENT STRENGTHS: Ability for insight Average or above average intelligence Capable of independent living Communication skills General fund of knowledge Motivation for treatment/growth Physical Health Supportive family/friends   PATIENT IDENTIFIED PROBLEMS: "anxiety"  "depressison"  "suicidal thoughts"  "family deaths"               DISCHARGE CRITERIA:  Follow up care, outpatient therapy Return to his home Take medications as prescripbed Attend PHP/IOP   PRELIMINARY DISCHARGE PLAN: Attend aftercare/continuing care group Attend PHP/IOP Outpatient therapy Return to previous living arrangement  PATIENT/FAMILY INVOLVEMENT: This treatment plan has been presented to and reviewed with the patient, Brian Rowe..  The patient and family have been given the opportunity to ask questions and make suggestions.  Quintella Reichert [place], [place] 04/15/2020, 6:55 PM

## 2020-04-15 NOTE — ED Triage Notes (Signed)
Patient reports feeling anxious , depressed and suicidal - plans to overdose or shoot himself , denies hallucinations .

## 2020-04-15 NOTE — ED Notes (Signed)
Breakfast Tray Ordered. 

## 2020-04-15 NOTE — ED Notes (Signed)
Patient wanded by security , personal belongings inventoried and stored at locker#2 / valuables at security safe .

## 2020-04-15 NOTE — ED Notes (Signed)
Pt wanded and cleared by security.  
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
## 2020-04-15 NOTE — ED Notes (Signed)
[facility]FE TRANSPORT CONTACTED

## 2020-04-15 NOTE — ED Notes (Signed)
RN attempted report but was told med pass going on and give them until 12:15

## 2020-04-15 NOTE — ED Provider Notes (Signed)
Pt has been accepted at [facility].  Pt remains voluntary and will be transported via a safe transport.  He remains stable for transfer.   Jacalyn Lefevre, MD 04/15/20 1136

## 2020-04-15 NOTE — Discharge Instructions (Signed)
Go to [facility] now.

## 2020-04-15 NOTE — Progress Notes (Signed)
Pt accepted to [facility], bed 306-2    Denzil Magnuson, NP is the accepting provider.    Dr. Jama Flavors is the attending provider.    Call report to [phone]    Pt is scheduled to arrive at 1230pm.   Wells Guiles, LCSW, LCAS Disposition CSW [facility]/TTS [phone]

## 2020-04-15 NOTE — ED Provider Notes (Signed)
[place] EMERGENCY DEPARTMENT Provider Note   CSN: [id number] Arrival date & time: 04/15/20  0342     History Chief Complaint  Patient presents with  . Suicidal    SPIROS GREENFELD is a 44 y.o. male with a history of anxiety, depression, hyperlipidemia, hypertension, hypothyroidism, & OSA on CPAP who presents to the ED with complaints of SI that began tonight. Patient states he started to have thoughts of suicide with plan to shoot himself or overdose on pills- he has access to guns (approximately 10) in his home as well as access to medications. He denies attempt of suicide, he did take 1 xanax prior to arrival to help with his anxiety, but denies taking more than what he is prescribed. He had written a suicide note to leave for his family, but instead decided to come to the ED. 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 Denies HI or hallucinations. Denies alcohol use or current drug use- states he utilized drugs in the past.   HPI     Past Medical History:  Diagnosis Date  . ANXIETY 07/19/2007  . DEPRESSION 07/19/2007  . HEMORRHOIDS, INTERNAL 12/01/2008  . HYPERLIPIDEMIA 07/19/2007  . HYPERTENSION 07/19/2007  . Sleep apnea     Patient Active Problem List   Diagnosis Date Noted  . AKI (acute kidney injury) ([place]) 04/05/2020  . Slurred speech 04/05/2020  . ADD (attention deficit disorder) 01/07/2019  . Bilateral carpal tunnel syndrome 10/13/2015  . Hypothyroidism 05/29/2015  . Degenerative arthritis of left ankle 12/30/2014  . Tendonitis, Achilles, right 12/30/2014  . Obesity, morbid ([place]) 08/04/2014  . OSA on CPAP 08/04/2014  . Thoracic back pain 07/23/2012  . HEMORRHOIDS, INTERNAL 12/01/2008  . VISUAL IMPAIRMENT 10/15/2007  . HEMATURIA 10/03/2007  . Dyslipidemia 07/19/2007  . Anxiety 07/19/2007  . DEPRESSION 07/19/2007  .  Essential hypertension 07/19/2007  . LOW BACK PAIN 07/19/2007    Past Surgical History:  Procedure Laterality Date  . left ankle Left   . NASAL SEPTUM SURGERY    . TONSILLECTOMY    . UVULOPALATOPHARYNGOPLASTY (UPPP)/TONSILLECTOMY/SEPTOPLASTY         Family History  Problem Relation Age of Onset  . Hypertension Father   . Anxiety disorder Father   . Obesity Mother   . Sleep apnea Mother   . Rheum arthritis Sister   . Alzheimer's disease Maternal Grandmother   . Stroke Maternal Grandfather   . Alzheimer's disease Paternal Grandmother   . Pancreatic cancer Paternal Grandfather     Social History   Tobacco Use  . Smoking status: Former Smoker    Quit date: 12/06/2011    Years since quitting: 8.3  . Smokeless tobacco: Never Used  Substance Use Topics  . Alcohol use: Yes    Alcohol/week: 1.0 standard drinks    Types: 1 Cans of beer per week  . Drug use: No    Home Medications Prior to Admission medications   Medication Sig Start Date End Date Taking? Authorizing Provider  alprazolam Duanne Moron) 2 MG tablet Take 2 mg by mouth 3 (three) times daily as needed for sleep. Prescribed by Dr.Kaur    [provider]  FLUoxetine (PROZAC) 40 MG capsule Take 40 mg by mouth daily.  02/18/16   [provider]  levothyroxine (SYNTHROID) 112 MCG tablet TAKE 1 TABLET BY MOUTH EVERY DAY  04/06/20   Wynn Banker, MD    Allergies    Tramadol  Review of Systems   Review of Systems  Constitutional: Negative for chills and fever.  Respiratory: Negative for shortness of breath.   Cardiovascular: Negative for chest pain.  Gastrointestinal: Negative for abdominal pain.  Psychiatric/Behavioral: Positive for suicidal ideas. Negative for hallucinations. The patient is nervous/anxious.        Negative for HI.   All other systems reviewed and are negative.   Physical Exam Updated Vital Signs BP (!) 147/95 (BP Location: Right Arm)   Pulse 96   Temp 98.4 F (36.9 C) (Oral)    Resp 16   Ht 6\' 3"  (1.905 m)   Wt (!) 170 kg   SpO2 96%   BMI 46.84 kg/m   Physical Exam Vitals and nursing note reviewed.  Constitutional:      General: He is not in acute distress.    Appearance: He is well-developed. He is not toxic-appearing.  HENT:     Head: Normocephalic and atraumatic.  Eyes:     General:        Right eye: No discharge.        Left eye: No discharge.     Conjunctiva/sclera: Conjunctivae normal.  Cardiovascular:     Rate and Rhythm: Normal rate and regular rhythm.  Pulmonary:     Effort: Pulmonary effort is normal. No respiratory distress.     Breath sounds: Normal breath sounds. No wheezing, rhonchi or rales.  Abdominal:     General: There is no distension.     Palpations: Abdomen is soft.     Tenderness: There is no abdominal tenderness.  Musculoskeletal:     Cervical back: Neck supple.  Skin:    General: Skin is warm.     Findings: No rash.  Neurological:     Mental Status: He is alert.     Comments: Clear speech.   Psychiatric:        Mood and Affect: Mood is anxious.        Thought Content: Thought content includes suicidal ideation. Thought content does not include homicidal ideation. Thought content includes suicidal plan. Thought content does not include homicidal plan.     Comments: Intermittently tearful.     ED Results / Procedures / Treatments   Labs (all labs ordered are listed, but only abnormal results are displayed) Labs Reviewed  SARS CORONAVIRUS 2 BY RT PCR (HOSPITAL ORDER, PERFORMED IN [facility])  COMPREHENSIVE METABOLIC PANEL  ETHANOL  SALICYLATE LEVEL  ACETAMINOPHEN LEVEL  CBC  RAPID URINE DRUG SCREEN, HOSP PERFORMED    EKG None  Radiology No results found.  Procedures Procedures (including critical care time)  Medications Ordered in ED Medications - No data to display  ED Course  I have reviewed the triage vital signs and the nursing notes.  Pertinent labs & imaging results that were  available during my care of the patient were reviewed by me and considered in my medical decision making (see chart for details).    BRADLEY BOSTELMAN was evaluated in Emergency Department on 04/15/2020 for the symptoms described in the history of present illness. He/she was evaluated in the context of the global COVID-19 pandemic, which necessitated consideration that the patient might be at risk for infection with the SARS-CoV-2 virus that causes COVID-19. Institutional protocols and algorithms that pertain to the evaluation of patients at risk for COVID-19 are in a state of rapid change based on  information released by regulatory bodies including the CDC and federal and state organizations. These policies and algorithms were followed during the patient's care in the ED.  MDM Rules/Calculators/A&P                     Patient presents to the ED with suicidal ideation with plan and access to weapons & medications at home. He is nontoxic, vitals WNL with the exception of elevated BP- doubt HTN emergency. Additional history obtained:  Additional history obtained from chart review & nursing note review.  Lab Tests:  I Ordered, reviewed, and interpreted labs, which included:  CBC, CMP, Ethanol level, Acetaminophen level, Salicylate level, UDS, COVID testing.  ED Course:  Screening labs reviewed mildly elevated creatinine & hgb- fairly similar to prior, mild elevation in ALT/t bili, no abdominal pain/tenderness- PCP recheck. COVID negative. UDS consistent with prescribed xanax.   Patient medically cleared for TTS assessment, disposition per [facility].   The patient has been placed in psychiatric observation due to the need to provide a safe environment for the patient while obtaining psychiatric consultation and evaluation, as well as ongoing medical and medication management to treat the patient's condition.  The patient has not been placed under full IVC at this time as he is voluntary.    Portions of this  note were generated with Scientist, clinical (histocompatibility and immunogenetics). Dictation errors may occur despite best attempts at proofreading.  Final Clinical Impression(s) / ED Diagnoses Final diagnoses:  Suicidal ideation    Rx / DC Orders ED Discharge Orders    None       Cherly Anderson, PA-C 04/15/20 0554    Marily Memos, MD 04/15/20 [phone]

## 2020-04-15 NOTE — Progress Notes (Signed)
[facility] Group Notes:  (Nursing/MHT/Case Management/Adjunct)  Date:  04/15/2020  Time:  2030  Type of Therapy:  wrap up group  Participation Level:  Active  Participation Quality:  Appropriate, Attentive, Sharing and Supportive  Affect:  Blunted  Cognitive:  Alert  Insight:  Improving  Engagement in Group:  Engaged  Modes of Intervention:  Clarification, Education and Support  Summary of Progress/Problems: Positive thinking and positive change were discussed.   Brian Rowe 04/15/2020, 9:53 PM

## 2020-04-15 NOTE — Progress Notes (Signed)
Patient ID: Brian Rowe, male   DOB: [birth year], 44 y.o.   MRN: [id number]  Patient to be admitted to [facility] adult psychiatric unit bed 306-02. Reviewed chart and admissions orders placed by Viona Gilmore, NP which are under signed and held and should be released once patient arrives to the unit.

## 2020-04-15 NOTE — ED Notes (Signed)
He reports he is feeling better this morning. Reports he couldn't sleep bc of cpap machine and had his 42 year old dtr that day and anxiety got to him. Reports he would like to follow up with own doctor on outpatient basis. RN informed Jasmin at [facility] and updated EDP.

## 2020-04-15 NOTE — ED Notes (Signed)
TTS interview in progress.  

## 2020-04-15 NOTE — BHH Suicide Risk Assessment (Signed)
[facility] Admission Suicide Risk Assessment   Nursing information obtained from:   Patient in chart Demographic factors:   43 year old male, currently separated, ., on disability Current Mental Status:   See below Loss Factors:   Separation, disability Historical Factors:   Reports history of anxiety, reports history of long-term management with benzodiazepines and (Xanax) Risk Reduction Factors:   Resilience, sense of responsibility to family  Total Time spent with patient: 45 minutes Principal Problem:  MDD Diagnosis:  MDD Subjective Data:   Continued Clinical Symptoms:    The "Alcohol Use Disorders Identification Test", Guidelines for Use in Primary Care, Second Edition.  World Science writer [facility]). Score between 0-7:  no or low risk or alcohol related problems. Score between 8-15:  moderate risk of alcohol related problems. Score between 16-19:  high risk of alcohol related problems. Score 20 or above:  warrants further diagnostic evaluation for alcohol dependence and treatment.   CLINICAL FACTORS:  44 year old male, presented on 5/12 reporting worsening depression and suicidal ideations which he states occurred on day prior to admission (with not having suicidal ideations before then).  He states he had thoughts of overdosing or shooting self, started to write a suicide note but thought against it and came to the ED.  He endorses some neurovegetative symptoms of depression and also increased worry/anxiety recently.  He does not identify any specific stressors or triggers for worsening depression but states he separated 2 years ago and lives alone, describes some social isolation in this context and related to Covid epidemic and resulting social restrictions. Of note, patient reports long-term management with Xanax which she is prescribed at 2 mg 3 times a day.  He states he has been on Xanax for years.  Denies misusing/abusing but states he has been taking prescribed dose on most days  (although sometimes takes 2 mg twice daily).   Psychiatric Specialty Exam: Physical Exam  Review of Systems  There were no vitals taken for this visit.There is no height or weight on file to calculate BMI.  See admit note MSE  COGNITIVE FEATURES THAT CONTRIBUTE TO RISK:  Closed-mindedness and Loss of executive function    SUICIDE RISK:   Moderate:  Frequent suicidal ideation with limited intensity, and duration, some specificity in terms of plans, no associated intent, good self-control, limited dysphoria/symptomatology, some risk factors present, and identifiable protective factors, including available and accessible social support.  PLAN OF CARE: Patient will be admitted to inpatient psychiatric unit for stabilization and safety. Will provide and encourage milieu participation. Provide medication management and maked adjustments as needed.  Will follow daily.    I certify that inpatient services furnished can reasonably be expected to improve the patient's condition.   Craige Cotta, MD 04/15/2020, 5:22 PM

## 2020-04-15 NOTE — Plan of Care (Signed)
Nurse discussed anxiety, depression, coping skills with patient. 

## 2020-04-15 NOTE — BHH Group Notes (Signed)
LCSW Group Therapy Note 04/15/2020 2:20 PM  Type of Therapy and Topic: Group Therapy: Overcoming Obstacles  Participation Level: Did Not Attend  Description of Group:  In this group patients will be encouraged to explore what they see as obstacles to their own wellness and recovery. They will be guided to discuss their thoughts, feelings, and behaviors related to these obstacles. The group will process together ways to cope with barriers, with attention given to specific choices patients can make. Each patient will be challenged to identify changes they are motivated to make in order to overcome their obstacles. This group will be process-oriented, with patients participating in exploration of their own experiences as well as giving and receiving support and challenge from other group members.  Therapeutic Goals: 1. Patient will identify personal and current obstacles as they relate to admission. 2. Patient will identify barriers that currently interfere with their wellness or overcoming obstacles.  3. Patient will identify feelings, thought process and behaviors related to these barriers. 4. Patient will identify two changes they are willing to make to overcome these obstacles:   Summary of Patient Progress  Invited, chose not to attend.     Therapeutic Modalities:  Cognitive Behavioral Therapy Solution Focused Therapy Motivational Interviewing Relapse Prevention Therapy   Alcario Drought Clinical Social Worker

## 2020-04-16 MED ORDER — TRAZODONE HCL 50 MG PO TABS
50.0000 mg | ORAL_TABLET | Freq: Once | ORAL | Status: DC
  Filled 2020-04-16: qty 1

## 2020-04-16 MED ORDER — SERTRALINE HCL 50 MG PO TABS
50.0000 mg | ORAL_TABLET | Freq: Every day | ORAL | Status: DC
  Administered 2020-04-17: 50 mg via ORAL
  Filled 2020-04-16 (×3): qty 1

## 2020-04-16 NOTE — Progress Notes (Signed)
   04/16/20 0900  Psych Admission Type (Psych Patients Only)  Admission Status Voluntary  Psychosocial Assessment  Patient Complaints Depression  Eye Contact Brief  Facial Expression Anxious;Sad;Worried  Affect Sad;Anxious;Apprehensive;Depressed  Speech Logical/coherent  Interaction Assertive  Motor Activity Other (Comment) (appropriate)  Appearance/Hygiene Unremarkable  Behavior Characteristics Cooperative  Mood Depressed  Thought Process  Coherency WDL  Content WDL  Delusions WDL  Perception WDL  Hallucination None reported or observed  Judgment WDL  Confusion None  Danger to Self  Current suicidal ideation? Denies  Danger to Others  Danger to Others None reported or observed

## 2020-04-16 NOTE — Progress Notes (Signed)
   04/16/20 2115  COVID-19 Daily Checkoff  Have you had a fever (temp > 37.80C/100F)  in the past 24 hours?  No  COVID-19 EXPOSURE  Have you traveled outside the state in the past 14 days? No  Have you been in contact with someone with a confirmed diagnosis of COVID-19 or PUI in the past 14 days without wearing appropriate PPE? No  Have you been living in the same home as a person with confirmed diagnosis of COVID-19 or a PUI (household contact)? No  Have you been diagnosed with COVID-19? No

## 2020-04-16 NOTE — Progress Notes (Signed)
[facility] MD Progress Note  04/16/2020 9:05 AM Brian Rowe  MRN:  [id number] Subjective:  Patient reports he is feeling better. States he feels less depressed and is no longer feeling hopeless . Denies suicidal ideations. Denies medication side effects thus far . Objective : I have discussed case with treatment team and have met with patient. 43 year old male, presented on 5/12 reporting worsening depression and suicidal ideations which he states occurred on day prior to admission (with not having suicidal ideations before then).  He states he had thoughts of overdosing or shooting self, started to write a suicide note but thought against it and came to the ED.  He endorses some neurovegetative symptoms of depression and also increased worry/anxiety recently.  He does not identify any specific stressors or triggers for worsening depression but states he separated 2 years ago and lives alone, describes some social isolation in this context and related to Covid epidemic and resulting social restrictions. Of note, patient reports long-term management with Xanax which she is prescribed at 2 mg 3 times a day.  He states he has been on Xanax for years.  Denies misusing/abusing but states he has been taking prescribed dose on most days (although sometimes takes 2 mg twice daily).  Patient reports he is feeling better today and presents with a more reactive affect . Currently denies suicidal or self injurious ideations. States " I've realized that suicide would be selfish, and I have my family and my daughter to live for".  Currently presents calm, in no acute distress, and no diaphoresis is noted today. He is not tremulous or restless. BP 132/88, pulse 96.  Visible in day room, interacting appropriately with peers .  Principal Problem: Depression Diagnosis: Active Problems:   MDD (major depressive disorder)  Total Time spent with patient: 15 minutes  Past Psychiatric History:   Past Medical History:   Past Medical History:  Diagnosis Date  . ANXIETY 07/19/2007  . DEPRESSION 07/19/2007  . HEMORRHOIDS, INTERNAL 12/01/2008  . HYPERLIPIDEMIA 07/19/2007  . HYPERTENSION 07/19/2007  . Sleep apnea     Past Surgical History:  Procedure Laterality Date  . left ankle Left   . NASAL SEPTUM SURGERY    . TONSILLECTOMY    . UVULOPALATOPHARYNGOPLASTY (UPPP)/TONSILLECTOMY/SEPTOPLASTY     Family History:  Family History  Problem Relation Age of Onset  . Hypertension Father   . Anxiety disorder Father   . Obesity Mother   . Sleep apnea Mother   . Rheum arthritis Sister   . Alzheimer's disease Maternal Grandmother   . Stroke Maternal Grandfather   . Alzheimer's disease Paternal Grandmother   . Pancreatic cancer Paternal Grandfather    Family Psychiatric  History: Social History:  Social History   Substance and Sexual Activity  Alcohol Use Not Currently  . Alcohol/week: 1.0 standard drinks  . Types: 1 Cans of beer per week     Social History   Substance and Sexual Activity  Drug Use No    Social History   Socioeconomic History  . Marital status: Single    Spouse name: Not on file  . Number of children: Not on file  . Years of education: Not on file  . Highest education level: Not on file  Occupational History  . Not on file  Tobacco Use  . Smoking status: Former Smoker    Quit date: 12/06/2011    Years since quitting: 8.3  . Smokeless tobacco: Never Used  Substance and Sexual Activity  . Alcohol  use: Not Currently    Alcohol/week: 1.0 standard drinks    Types: 1 Cans of beer per week  . Drug use: No  . Sexual activity: Yes  Other Topics Concern  . Not on file  Social History Narrative  . Not on file   Social Determinants of Health   Financial Resource Strain:   . Difficulty of Paying Living Expenses:   Food Insecurity:   . Worried About Charity fundraiser in the Last Year:   . Arboriculturist in the Last Year:   Transportation Needs:   . Film/video editor  (Medical):   Marland Kitchen Lack of Transportation (Non-Medical):   Physical Activity:   . Days of Exercise per Week:   . Minutes of Exercise per Session:   Stress:   . Feeling of Stress :   Social Connections:   . Frequency of Communication with Friends and Family:   . Frequency of Social Gatherings with Friends and Family:   . Attends Religious Services:   . Active Member of Clubs or Organizations:   . Attends Archivist Meetings:   Marland Kitchen Marital Status:    Additional Social History:    Pain Medications: see MAR Prescriptions: see MAR Over the Counter: see MAR History of alcohol / drug use?: Yes Longest period of sobriety (when/how long): unsure Negative Consequences of Use: Financial Withdrawal Symptoms: Other (Comment)(none)  Sleep: reports improved sleep  Appetite:  Good  Current Medications: Current Facility-Administered Medications  Medication Dose Route Frequency Provider Last Rate Last Admin  . acetaminophen (TYLENOL) tablet 650 mg  650 mg Oral Q6H PRN Anike, Adaku C, NP      . ALPRAZolam (XANAX) tablet 2 mg  2 mg Oral BID Cobos, Myer Peer, MD   2 mg at 04/16/20 0813  . alum & mag hydroxide-simeth (MAALOX/MYLANTA) 200-200-20 MG/5ML suspension 30 mL  30 mL Oral Q4H PRN Anike, Adaku C, NP      . hydrOXYzine (ATARAX/VISTARIL) tablet 25 mg  25 mg Oral TID PRN Anike, Adaku C, NP   25 mg at 04/15/20 2119  . levothyroxine (SYNTHROID) tablet 112 mcg  112 mcg Oral Q0600 Cobos, Myer Peer, MD   112 mcg at 04/16/20 0649  . magnesium hydroxide (MILK OF MAGNESIA) suspension 30 mL  30 mL Oral Daily PRN Anike, Adaku C, NP      . nicotine (NICODERM CQ - dosed in mg/24 hours) patch 21 mg  21 mg Transdermal Daily Cobos, Fernando A, MD      . sertraline (ZOLOFT) tablet 25 mg  25 mg Oral Daily Cobos, Myer Peer, MD   25 mg at 04/16/20 0813  . traZODone (DESYREL) tablet 50 mg  50 mg Oral QHS PRN Anike, Adaku C, NP   50 mg at 04/15/20 2119    Lab Results:  Results for orders placed or  performed during the hospital encounter of 04/15/20 (from the past 48 hour(s))  Rapid urine drug screen (hospital performed)     Status: Abnormal   Collection Time: 04/15/20  4:05 AM  Result Value Ref Range   Opiates NONE DETECTED NONE DETECTED   Cocaine NONE DETECTED NONE DETECTED   Benzodiazepines POSITIVE (A) NONE DETECTED   Amphetamines NONE DETECTED NONE DETECTED   Tetrahydrocannabinol NONE DETECTED NONE DETECTED   Barbiturates NONE DETECTED NONE DETECTED    Comment: (NOTE) DRUG SCREEN FOR MEDICAL PURPOSES ONLY.  IF CONFIRMATION IS NEEDED FOR ANY PURPOSE, NOTIFY LAB WITHIN 5 DAYS. LOWEST DETECTABLE LIMITS FOR  URINE DRUG SCREEN Drug Class                     Cutoff (ng/mL) Amphetamine and metabolites    1000 Barbiturate and metabolites    200 Benzodiazepine                 147 Tricyclics and metabolites     300 Opiates and metabolites        300 Cocaine and metabolites        300 THC                            50 Performed at [facility], [place] [address] [place], [place] [address]   SARS Coronavirus 2 by RT PCR (hospital order, performed in [facility] hospital lab) Nasopharyngeal Nasopharyngeal Swab     Status: None   Collection Time: 04/15/20  4:05 AM   Specimen: Nasopharyngeal Swab  Result Value Ref Range   SARS Coronavirus 2 NEGATIVE NEGATIVE    Comment: (NOTE) SARS-CoV-2 target nucleic acids are NOT DETECTED. The SARS-CoV-2 RNA is generally detectable in upper and lower respiratory specimens during the acute phase of infection. The lowest concentration of SARS-CoV-2 viral copies this assay can detect is 250 copies / mL. A negative result does not preclude SARS-CoV-2 infection and should not be used as the sole basis for treatment or other patient management decisions.  A negative result may occur with improper specimen collection / handling, submission of specimen other than nasopharyngeal swab, presence of viral mutation(s) within the areas targeted by this  assay, and inadequate number of viral copies (<250 copies / mL). A negative result must be combined with clinical observations, patient history, and epidemiological information. Fact Sheet for Patients:   StrictlyIdeas.no Fact Sheet for Healthcare Providers: BankingDealers.co.za This test is not yet approved or cleared  by the [place] FDA and has been authorized for detection and/or diagnosis of SARS-CoV-2 by FDA under an Emergency Use Authorization (EUA).  This EUA will remain in effect (meaning this test can be used) for the duration of the COVID-19 declaration under Section 564(b)(1) of the Act, 21 U.S.C. section 360bbb-3(b)(1), unless the authorization is terminated or revoked sooner. Performed at [facility], [place] [address] [place], [place] [address]   Comprehensive metabolic panel     Status: Abnormal   Collection Time: 04/15/20  4:15 AM  Result Value Ref Range   Sodium 137 135 - 145 mmol/L   Potassium 4.2 3.5 - 5.1 mmol/L   Chloride 98 98 - 111 mmol/L   CO2 26 22 - 32 mmol/L   Glucose, Bld 84 70 - 99 mg/dL    Comment: Glucose reference range applies only to samples taken after fasting for at least 8 hours.   BUN 10 6 - 20 mg/dL   Creatinine, Ser 1.36 (H) 0.61 - 1.24 mg/dL   Calcium 9.3 8.9 - 10.3 mg/dL   Total Protein 8.0 6.5 - 8.1 g/dL   Albumin 4.5 3.5 - 5.0 g/dL   AST 30 15 - 41 U/L   ALT 65 (H) 0 - 44 U/L   Alkaline Phosphatase 54 38 - 126 U/L   Total Bilirubin 1.5 (H) 0.3 - 1.2 mg/dL   GFR calc non Af Amer >60 >60 mL/min   GFR calc Af Amer >60 >60 mL/min   Anion gap 13 5 - 15    Comment: Performed at [facility], [place] [address] [place], [place] [address]  Ethanol     Status: None   Collection Time: 04/15/20  4:15 AM  Result Value Ref Range   Alcohol, Ethyl (B) <10 <10 mg/dL    Comment: (NOTE) Lowest detectable limit for serum alcohol is 10 mg/dL. For medical purposes only. Performed at [facility], [place] [address], [place], [place] [address]   Salicylate level     Status: Abnormal   Collection Time: 04/15/20  4:15 AM  Result Value Ref Range   Salicylate Lvl <6.3 (L) 7.0 - 30.0 mg/dL    Comment: Performed at [place] [address], [place], [place] [address]  Acetaminophen level     Status: Abnormal   Collection Time: 04/15/20  4:15 AM  Result Value Ref Range   Acetaminophen (Tylenol), Serum <10 (L) 10 - 30 ug/mL    Comment: (NOTE) Therapeutic concentrations vary significantly. A range of 10-30 ug/mL  may be an effective concentration for many patients. However, some  are best treated at concentrations outside of this range. Acetaminophen concentrations >150 ug/mL at 4 hours after ingestion  and >50 ug/mL at 12 hours after ingestion are often associated with  toxic reactions. Performed at [facility], [place] [address], [place], [place] [address]   cbc     Status: Abnormal   Collection Time: 04/15/20  4:15 AM  Result Value Ref Range   WBC 6.5 4.0 - 10.5 K/uL   RBC 5.50 4.22 - 5.81 MIL/uL   Hemoglobin 17.3 (H) 13.0 - 17.0 g/dL   HCT 50.7 39.0 - 52.0 %   MCV 92.2 80.0 - 100.0 fL   MCH 31.5 26.0 - 34.0 pg   MCHC 34.1 30.0 - 36.0 g/dL   RDW 12.6 11.5 - 15.5 %   Platelets 236 150 - 400 K/uL   nRBC 0.0 0.0 - 0.2 %    Comment: Performed at [facility], [place] [address] [place], [place] [address]    Blood Alcohol level:  Lab Results  Component Value Date   ETH <10 04/15/2020   ETH <10 27/74/1287    Metabolic Disorder Labs: No results found for: HGBA1C, MPG No results found for: PROLACTIN Lab Results  Component Value Date   CHOL 207 (H) 04/10/2020   TRIG 226.0 (H) 04/10/2020   HDL 30.70 (L) 04/10/2020   CHOLHDL 7 04/10/2020   VLDL 45.2 (H) 04/10/2020   LDLCALC 183 (H) 01/07/2019    Physical Findings: AIMS: Facial and Oral Movements Muscles of Facial Expression: None, normal Jaw: None, normal,Extremity Movements Upper (arms,  wrists, hands, fingers): None, normal Lower (legs, knees, ankles, toes): None, normal, Trunk Movements Neck, shoulders, hips: None, normal, Overall Severity Severity of abnormal movements (highest score from questions above): None, normal Incapacitation due to abnormal movements: None, normal Patient's awareness of abnormal movements (rate only patient's report): No Awareness, Dental Status Current problems with teeth and/or dentures?: No Does patient usually wear dentures?: No  CIWA:  CIWA-Ar Total: 2 COWS:  COWS Total Score: 2  Musculoskeletal: Strength & Muscle Tone: within normal limits- no tremors, no diaphoresis noted, no psychomotor restlessness  Gait & Station: normal Patient leans: N/A  Psychiatric Specialty Exam: Physical Exam  Review of Systems no headache, no chest pain, no shortness of breath, no vomiting , no fever . Reports mild constipation.  Blood pressure 132/88, pulse 96, temperature 98.2 F (36.8 C), temperature source Oral, resp. rate 18, height '6\' 3"'  (  1.905 m), weight (!) 154.2 kg, SpO2 98 %.Body mass index is 42.5 kg/m.  General Appearance: improved grooming   Eye Contact:  Good  Speech:  Normal Rate  Volume:  Normal  Mood:  improving mood , reports he is feeling better  Affect:  more reactive today  Thought Process:  Linear  Orientation:  Full (Time, Place, and Person)  Thought Content:  no hallucinations, no delusions , not internally preoccupied   Suicidal Thoughts:  No denies suicidal or self injurious ideations  Homicidal Thoughts:  No  Memory:  recent and remote grossly intact   Judgement:  Other:  improving  Insight:  fair- improving  Psychomotor Activity:  Normal  Concentration:  Concentration: Good and Attention Span: Good  Recall:  Good  Fund of Knowledge:  Good  Language:  Good  Akathisia:  Negative  Handed:  Right  AIMS (if indicated):     Assets:  Communication Skills Desire for Improvement Resilience  ADL's:  Intact  Cognition:  WNL   Sleep:  Number of Hours: 5.5   Assessment -  44 year old male, presented on 5/12 reporting worsening depression and suicidal ideations which he states occurred on day prior to admission (with not having suicidal ideations before then).  He states he had thoughts of overdosing or shooting self, started to write a suicide note but thought against it and came to the ED.  He endorses some neurovegetative symptoms of depression and also increased worry/anxiety recently.  He does not identify any specific stressors or triggers for worsening depression but states he separated 2 years ago and lives alone, describes some social isolation in this context and related to Covid epidemic and resulting social restrictions. Of note, patient reports long-term management with Xanax which she is prescribed at 2 mg 3 times a day.  He states he has been on Xanax for years.  Denies misusing/abusing but states he has been taking prescribed dose on most days (although sometimes takes 2 mg twice daily).  Today patient reports feeling better, and describes improving mood and decreased anxiety. Currently denies suicidal ideations and describes love for daughter and family as protective factors. Denies WDL symptoms and presents calm, in no acute distress , with stable vitals. Tolerating Zoloft well thus far   Treatment Plan Summary: Daily contact with patient to assess and evaluate symptoms and progress in treatment, Medication management, Plan inpatient treatment and medications as below Encourage group and milieu participation Continue Xanax 2 mgrs BID for anxiety- (patient reports he has been on this medication for years. We have reviewed recommendations to consider gradually tapering down) Increase Zoloft to 50 mgrs QDAY for depression, anxiety Continue Synthroid 112 micrograms daily for Hypothyroidism Milk of Magnesia PRN for constipation  Treatment team working on disposition planning options .  Jenne Campus,  MD 04/16/2020, 9:05 AM

## 2020-04-16 NOTE — Progress Notes (Signed)
   04/16/20 0000  Psych Admission Type (Psych Patients Only)  Admission Status Voluntary  Psychosocial Assessment  Patient Complaints Depression  Eye Contact Brief  Facial Expression Anxious;Sad;Worried  Affect Sad;Anxious;Apprehensive;Depressed  Speech Logical/coherent  Interaction Assertive  Motor Activity Other (Comment) (appropriate)  Appearance/Hygiene Unremarkable  Behavior Characteristics Cooperative  Mood Depressed  Thought Process  Coherency WDL  Content WDL  Delusions WDL  Perception WDL  Hallucination None reported or observed  Judgment WDL  Confusion None  Danger to Self  Current suicidal ideation? Denies  Danger to Others  Danger to Others None reported or observed

## 2020-04-16 NOTE — Progress Notes (Signed)
Adult Psychoeducational Group Note  Date:  04/16/2020 Time:  9:40 PM  Group Topic/Focus:  Wrap-Up Group:   The focus of this group is to help patients review their daily goal of treatment and discuss progress on daily workbooks.  Participation Level:  Active  Participation Quality:  Appropriate  Affect:  Appropriate  Cognitive:  Alert  Insight: Appropriate  Engagement in Group:  Engaged  Modes of Intervention:  Discussion  Additional Comments:  The pt reports that his day was good. The pt reports that he enjoyed talking to his peers today. The pt did express concern about there not being any group during the day.   Kaleen Odea R 04/16/2020, 9:40 PM

## 2020-04-17 MED ORDER — SERTRALINE HCL 50 MG PO TABS: 50.0000 mg | ORAL_TABLET | Freq: Every day | ORAL | 0 refills | Status: DC

## 2020-04-17 MED ORDER — NICOTINE 21 MG/24HR TD PT24: 21.0000 mg | MEDICATED_PATCH | Freq: Every day | TRANSDERMAL | 0 refills | Status: DC

## 2020-04-17 MED ORDER — ALPRAZOLAM 2 MG PO TABS: 2.0000 mg | ORAL_TABLET | Freq: Two times a day (BID) | ORAL | 0 refills | Status: DC

## 2020-04-17 MED ORDER — TRAZODONE HCL 50 MG PO TABS: 50.0000 mg | ORAL_TABLET | Freq: Every evening | ORAL | 0 refills | Status: DC | PRN

## 2020-04-17 NOTE — Progress Notes (Signed)
D: Pt A & O X . Denies SI, HI, AVH and pain at this time. D/C home as ordered. Picked up in lobby by  A: D/C instructions reviewed with pt including prescriptions, medication samples and follow up appointment, compliance encouraged. All belongings from assigned locker returned to pt at time of departure. Scheduled medications administered with verbal education and effects monitored. Safety checks maintained without incident till time of d/c.  R: Pt receptive to care. Compliant with medications when offered. Denies adverse drug reactions when assessed. Verbalized understanding related to d/c instructions. Signed belonging sheet in agreement with items received from locker. Ambulatory with a steady gait. Appears to be in no physical distress at time of departure.

## 2020-04-17 NOTE — [facility] Suicide Risk Assessment (Signed)
[facility] INPATIENT:  Family/Significant Other Suicide Prevention Education  Suicide Prevention Education:  Patient Refusal for Family/Significant Other Suicide Prevention Education: The patient Brian Rowe has refused to provide written consent for family/significant other to be provided Family/Significant Other Suicide Prevention Education during admission and/or prior to discharge.  Physician notified.  SPE completed with patient, as patient refused to consent to family contact. SPI pamphlet provided to pt and pt was encouraged to share information with support network, ask questions, and talk about any concerns relating to SPE. Patient denies access to guns/firearms and verbalized understanding of information provided. Mobile Crisis information also provided to patient.    [name] 04/17/2020, 10:37 AM

## 2020-04-17 NOTE — Tx Team (Signed)
Interdisciplinary Treatment and Diagnostic Plan Update  04/17/2020 Time of Session: 9:30am Brian Rowe MRN: [id number]  Principal Diagnosis: <principal problem not specified>  Secondary Diagnoses: Active Problems:   MDD (major depressive disorder)   Current Medications:  Current Facility-Administered Medications  Medication Dose Route Frequency Provider Last Rate Last Admin  . acetaminophen (TYLENOL) tablet 650 mg  650 mg Oral Q6H PRN Anike, Adaku C, NP   650 mg at 04/16/20 1725  . ALPRAZolam Prudy Feeler) tablet 2 mg  2 mg Oral BID Cobos, Rockey Situ, MD   2 mg at 04/17/20 0814  . alum & mag hydroxide-simeth (MAALOX/MYLANTA) 200-200-20 MG/5ML suspension 30 mL  30 mL Oral Q4H PRN Anike, Adaku C, NP      . levothyroxine (SYNTHROID) tablet 112 mcg  112 mcg Oral Q0600 Cobos, Rockey Situ, MD   112 mcg at 04/17/20 1696  . magnesium hydroxide (MILK OF MAGNESIA) suspension 30 mL  30 mL Oral Daily PRN Anike, Adaku C, NP   30 mL at 04/16/20 0928  . nicotine (NICODERM CQ - dosed in mg/24 hours) patch 21 mg  21 mg Transdermal Daily Cobos, Fernando A, MD      . sertraline (ZOLOFT) tablet 50 mg  50 mg Oral Daily Cobos, Rockey Situ, MD   50 mg at 04/17/20 0814  . traZODone (DESYREL) tablet 50 mg  50 mg Oral Once Anike, Adaku C, NP       PTA Medications: Medications Prior to Admission  Medication Sig Dispense Refill Last Dose  . acetaminophen (TYLENOL) 500 MG tablet Take 1,000 mg by mouth 2 (two) times daily as needed for mild pain.     Marland Kitchen alprazolam (XANAX) 2 MG tablet Take 2 mg by mouth 3 (three) times daily as needed for sleep. Prescribed by Dr.Kaur     . levothyroxine (SYNTHROID) 112 MCG tablet TAKE 1 TABLET BY MOUTH EVERY DAY (Patient taking differently: Take 112 mcg by mouth daily before breakfast. ) 30 tablet 0     Patient Stressors: Financial difficulties Marital or family conflict Traumatic event  Patient Strengths: Ability for insight Average or above average intelligence Capable of  independent living Wellsite geologist fund of knowledge Motivation for treatment/growth Physical Health Supportive family/friends  Treatment Modalities: Medication Management, Group therapy, Case management,  1 to 1 session with clinician, Psychoeducation, Recreational therapy.   Physician Treatment Plan for Primary Diagnosis: <principal problem not specified> Long Term Goal(s): Improvement in symptoms so as ready for discharge Improvement in symptoms so as ready for discharge   Short Term Goals: Ability to identify changes in lifestyle to reduce recurrence of condition will improve Ability to verbalize feelings will improve Ability to disclose and discuss suicidal ideas Ability to demonstrate self-control will improve Ability to identify and develop effective coping behaviors will improve Ability to maintain clinical measurements within normal limits will improve Compliance with prescribed medications will improve Ability to identify changes in lifestyle to reduce recurrence of condition will improve Ability to verbalize feelings will improve Ability to disclose and discuss suicidal ideas Ability to demonstrate self-control will improve Ability to identify and develop effective coping behaviors will improve Ability to maintain clinical measurements within normal limits will improve Compliance with prescribed medications will improve  Medication Management: Evaluate patient's response, side effects, and tolerance of medication regimen.  Therapeutic Interventions: 1 to 1 sessions, Unit Group sessions and Medication administration.  Evaluation of Outcomes: Adequate for Discharge  Physician Treatment Plan for Secondary Diagnosis: Active Problems:   MDD (major depressive  disorder)  Long Term Goal(s): Improvement in symptoms so as ready for discharge Improvement in symptoms so as ready for discharge   Short Term Goals: Ability to identify changes in lifestyle to reduce  recurrence of condition will improve Ability to verbalize feelings will improve Ability to disclose and discuss suicidal ideas Ability to demonstrate self-control will improve Ability to identify and develop effective coping behaviors will improve Ability to maintain clinical measurements within normal limits will improve Compliance with prescribed medications will improve Ability to identify changes in lifestyle to reduce recurrence of condition will improve Ability to verbalize feelings will improve Ability to disclose and discuss suicidal ideas Ability to demonstrate self-control will improve Ability to identify and develop effective coping behaviors will improve Ability to maintain clinical measurements within normal limits will improve Compliance with prescribed medications will improve     Medication Management: Evaluate patient's response, side effects, and tolerance of medication regimen.  Therapeutic Interventions: 1 to 1 sessions, Unit Group sessions and Medication administration.  Evaluation of Outcomes: Adequate for Discharge   RN Treatment Plan for Primary Diagnosis: <principal problem not specified> Long Term Goal(s): Knowledge of disease and therapeutic regimen to maintain health will improve  Short Term Goals: Ability to participate in decision making will improve, Ability to verbalize feelings will improve, Ability to disclose and discuss suicidal ideas, Ability to identify and develop effective coping behaviors will improve and Compliance with prescribed medications will improve  Medication Management: RN will administer medications as ordered by provider, will assess and evaluate patient's response and provide education to patient for prescribed medication. RN will report any adverse and/or side effects to prescribing provider.  Therapeutic Interventions: 1 on 1 counseling sessions, Psychoeducation, Medication administration, Evaluate responses to treatment, Monitor  vital signs and CBGs as ordered, Perform/monitor CIWA, COWS, AIMS and Fall Risk screenings as ordered, Perform wound care treatments as ordered.  Evaluation of Outcomes: Adequate for Discharge   LCSW Treatment Plan for Primary Diagnosis: <principal problem not specified> Long Term Goal(s): Safe transition to appropriate next level of care at discharge, Engage patient in therapeutic group addressing interpersonal concerns.  Short Term Goals: Engage patient in aftercare planning with referrals and resources  Therapeutic Interventions: Assess for all discharge needs, 1 to 1 time with Social worker, Explore available resources and support systems, Assess for adequacy in community support network, Educate family and significant other(s) on suicide prevention, Complete Psychosocial Assessment, Interpersonal group therapy.  Evaluation of Outcomes: Adequate for Discharge   Progress in Treatment: Attending groups: No. Participating in groups: No. Taking medication as prescribed: Yes. Toleration medication: Yes. Family/Significant other contact made: No, will contact:  no one, patient declined consent Patient understands diagnosis: Yes. Discussing patient identified problems/goals with staff: Yes. Medical problems stabilized or resolved: Yes. Denies suicidal/homicidal ideation: Yes. Issues/concerns per patient self-inventory: No. Other:   New problem(s) identified: None   New Short Term/Long Term Goal(s): medication stabilization, elimination of SI thoughts, development of comprehensive mental wellness plan.    Patient Goals:  "Able to deal with my anxiety in a positive way. I've learned anxiety is a natural reaction"   Discharge Plan or Barriers: Patient is returning home and will continue to follow up with Dr. Chucky May for medication management services.   Reason for Continuation of Hospitalization: None   Estimated Length of Stay: Discharge, 04/17/20  Attendees: Patient: Brian Rowe  04/17/2020 9:45 AM  Physician: Dr. Neita Garnet, MD 04/17/2020 9:45 AM  Nursing:  04/17/2020 9:45 AM  RN Care  Manager: 04/17/2020 9:45 AM  Social Worker: Baldo Daub, LCSW 04/17/2020 9:45 AM  Recreational Therapist:  04/17/2020 9:45 AM  Other:  04/17/2020 9:45 AM  Other:  04/17/2020 9:45 AM  Other: 04/17/2020 9:45 AM    Scribe for Treatment Team: [name], LCSWA 04/17/2020 9:45 AM

## 2020-04-17 NOTE — Progress Notes (Signed)
  [facility] Adult Case Management Discharge Plan :  Will you be returning to the same living situation after discharge:  Yes,  patient is returning home At discharge, do you have transportation home?: Yes,  patient's father is picking him up Do you have the ability to pay for your medications: Yes,  Medicare  Release of information consent forms completed and in the chart;  Patient's signature needed at discharge.  Patient to Follow up at: Follow-up Information    Milagros Evener, MD. Call on 04/21/2020.   Specialty: Psychiatry Why: You are scheduled for an appointment for medication management on 04/21/20 at 3:00 pm.  This will be a Virtual appointment.  Please call this provider asap to set up the virtual appointment. Be sure to have your discharge paperwork available.  Contact information: [address] [place] [address] [phone]           Next level of care provider has access to [facility] Link:yes  Safety Planning and Suicide Prevention discussed: Yes,  with the patient  Have you used any form of tobacco in the last 30 days? (Cigarettes, Smokeless Tobacco, Cigars, and/or Pipes): No  Has patient been referred to the Quitline?: N/A patient is not a smoker  Patient has been referred for addiction treatment: N/A  [name], LCSWA 04/17/2020, 10:46 AM

## 2020-04-17 NOTE — [facility] Suicide Risk Assessment (Signed)
[facility] Discharge Suicide Risk Assessment   Principal Problem: Depression Discharge Diagnoses: Active Problems:   MDD (major depressive disorder)   Total Time spent with patient: 30 minutes  Musculoskeletal: Strength & Muscle Tone: within normal limits-no tremors,  no restlessness, presents calm and in no acute distress Gait & Station: normal Patient leans: N/A  Psychiatric Specialty Exam: Review of Systems today denies headache, denies chest pain, no shortness of breath, no nausea, no vomiting  Blood pressure (!) 126/101, pulse 82, temperature 98.2 F (36.8 C), temperature source Oral, resp. rate 18, height 6\' 3"  (1.905 m), weight (!) 154.2 kg, SpO2 100 %.Body mass index is 42.5 kg/m.  General Appearance: Well Groomed  Eye Contact::  Good  Speech:  Normal [id number]  Volume:  Normal  Mood:  Reports feeling better.  States his mood is improved.  Denies feeling depressed at this time.  Affect:  Reactive, appropriate  Thought Process:  Linear and Descriptions of Associations: Intact  Orientation:  Full (Time, Place, and Person)  Thought Content:  No hallucinations, no delusions  Suicidal Thoughts:  No denies any suicidal or self-injurious ideations, denies homicidal or violent ideations  Homicidal Thoughts:  No  Memory:  Recent and remote grossly intact  Judgement:  Other:  Improving  Insight:  Fair/improving  Psychomotor Activity:  Normal no tremors, no restlessness, presents comfortable and calm  Concentration:  Good  Recall:  Good  Fund of Knowledge:Good  Language: Good  Akathisia:  Negative  Handed:  Right  AIMS (if indicated):     Assets:  Communication Skills Desire for Improvement Resilience  Sleep:  Number of Hours: 6.75  Cognition: WNL  ADL's:  Intact   Mental Status Per Nursing Assessment::   On Admission:  Suicidal ideation indicated by patient  Demographic Factors:  44 year old male, separated, has a 81-year-old daughter, currently lives alone.  On  disability  Loss Factors: Separation, disability, history of recent trauma after falling off a ladder in early May  Historical Factors: Reports history of depression, anxiety, 1 prior psychiatric admission as a teenager.  Of note, states he has been prescribed Xanax for years. ( Was prescribed Xanax 2 mgrs TID prior to admission)   Risk Reduction Factors:   Responsible for children under 80 years of age, Sense of responsibility to family, Positive social support and Positive coping skills or problem solving skills  Continued Clinical Symptoms:  Today patient presents alert, attentive, calm without psychomotor agitation, no psychomotor restlessness noted.  No tremors.  Describes mood as improved.  Affect is more reactive, less anxious.  Denies suicidal ideations and presents future oriented.  No homicidal or violent ideations.  No hallucinations, no delusions.  Oriented x3. Behavior on unit has been calm and in good control.  Has been visible in dayroom and has been more interactive with peers. At this time he is not presenting with symptoms of withdrawal and presents calm, comfortable, without tremors, pulse 82.   Denies medication side effects thus far.  We have reviewed side effect profile.  As noted, patient reports he has been prescribed Xanax for years.  Denies abusing or misusing this medication.  We have reviewed potential risks associated with long-term benzodiazepine management to include habituation/tolerance, withdrawal risk, increased risk of falls, risk of sedation.  I have encouraged him to consider/discuss a gradual benzodiazepine taper under the guidance of his outpatient psychiatrist/prescriber.  Xanax has been tapered to 2 mg twice daily which he is tolerating well without symptoms of withdrawal. With his expressed consent  I spoke with his father on the phone.  Father corroborates that patient seems improved and back to baseline.  Father does not have concerns regarding discharge  today and will be picking him up upon discharge.  Cognitive Features That Contribute To Risk:  No gross cognitive deficits noted upon discharge. Is alert , attentive, and oriented x 3    Suicide Risk:  Mild:  Suicidal ideation of limited frequency, intensity, duration, and specificity.  There are no identifiable plans, no associated intent, mild dysphoria and related symptoms, good self-control (both objective and subjective assessment), few other risk factors, and identifiable protective factors, including available and accessible social support.    Plan Of Care/Follow-up recommendations:  Activity:  As tolerated Diet:  Heart healthy Tests:  NA Other:  See below  Patient is expressing readiness for discharge.  There are no current grounds for involuntary commitment.  He is leaving unit in good spirits.  He plans to return home.  He also reports he is close to his parents who provide support .  He has an established primary care doctor whom he plans to follow-up with, Dr.Koberlein.  He also plans to follow-up with Dr. Evelene Croon for ongoing psychiatric management and expresses interest in starting individual psychotherapy as well.  Craige Cotta, MD 04/17/2020, 9:29 AM

## 2020-04-17 NOTE — Progress Notes (Signed)
   04/16/20 2115  Psych Admission Type (Psych Patients Only)  Admission Status Voluntary  Psychosocial Assessment  Patient Complaints Depression;Anxiety;Sleep disturbance  Eye Contact Brief  Facial Expression Anxious;Worried  Affect Anxious;Depressed;Sad  Speech Logical/coherent  Interaction Arrogant  Motor Activity Other (Comment) (WDL)  Appearance/Hygiene Unremarkable  Behavior Characteristics Cooperative;Anxious  Mood Depressed;Sad  Thought Process  Coherency WDL  Content WDL  Delusions None reported or observed  Perception WDL  Hallucination None reported or observed  Judgment WDL  Confusion None  Danger to Self  Current suicidal ideation? Denies  Danger to Others  Danger to Others None reported or observed

## 2020-04-17 NOTE — Discharge Summary (Addendum)
Physician Discharge Summary Note  Patient:  Brian Rowe is an 44 y.o., male  MRN:  [id number]  DOB:  August 28, 1976  Patient phone:  [phone] (home)   Patient address:   [address] [place] [address],   Total Time spent with patient: Greater than 30 minutes  Date of Admission:  04/15/2020  Date of Discharge: 04-17-20  Reason for Admission: Worsening symptoms of depression & suicidal ideations with  thoughts of overdosing or shooting himself.   Principal Problem: MDD (major depressive disorder)  Discharge Diagnoses: Principal Problem:   MDD (major depressive disorder)  Past Psychiatric History: Major depressive disorder, recurrent episodes.  Past Medical History:  Past Medical History:  Diagnosis Date  . ANXIETY 07/19/2007  . DEPRESSION 07/19/2007  . HEMORRHOIDS, INTERNAL 12/01/2008  . HYPERLIPIDEMIA 07/19/2007  . HYPERTENSION 07/19/2007  . Sleep apnea     Past Surgical History:  Procedure Laterality Date  . left ankle Left   . NASAL SEPTUM SURGERY    . TONSILLECTOMY    . UVULOPALATOPHARYNGOPLASTY (UPPP)/TONSILLECTOMY/SEPTOPLASTY     Family History:  Family History  Problem Relation Age of Onset  . Hypertension Father   . Anxiety disorder Father   . Obesity Mother   . Sleep apnea Mother   . Rheum arthritis Sister   . Alzheimer's disease Maternal Grandmother   . Stroke Maternal Grandfather   . Alzheimer's disease Paternal Grandmother   . Pancreatic cancer Paternal Grandfather    Family Psychiatric  History: See H&P.  Social History:  Social History   Substance and Sexual Activity  Alcohol Use Not Currently  . Alcohol/week: 1.0 standard drinks  . Types: 1 Cans of beer per week     Social History   Substance and Sexual Activity  Drug Use No    Social History   Socioeconomic History  . Marital status: Single    Spouse name: Not on file  . Number of children: Not on file  . Years of education: Not on file  . Highest education level:  Not on file  Occupational History  . Not on file  Tobacco Use  . Smoking status: Former Smoker    Quit date: 12/06/2011    Years since quitting: 8.3  . Smokeless tobacco: Never Used  Substance and Sexual Activity  . Alcohol use: Not Currently    Alcohol/week: 1.0 standard drinks    Types: 1 Cans of beer per week  . Drug use: No  . Sexual activity: Yes  Other Topics Concern  . Not on file  Social History Narrative  . Not on file   Social Determinants of Health   Financial Resource Strain:   . Difficulty of Paying Living Expenses:   Food Insecurity:   . Worried About Programme researcher, broadcasting/film/video in the Last Year:   . Barista in the Last Year:   Transportation Needs:   . Freight forwarder (Medical):   Marland Kitchen Lack of Transportation (Non-Medical):   Physical Activity:   . Days of Exercise per Week:   . Minutes of Exercise per Session:   Stress:   . Feeling of Stress :   Social Connections:   . Frequency of Communication with Friends and Family:   . Frequency of Social Gatherings with Friends and Family:   . Attends Religious Services:   . Active Member of Clubs or Organizations:   . Attends Banker Meetings:   Marland Kitchen Marital Status:    Hospital Course: (Per Md's admission  evaluation notes): 44 year old male . Presented to ED on 5/12 reporting worsening  depression. On day of admission he developed suicidal ideations, with  thoughts of overdosing or shooting self. Currently describes them as fleeting, and states he was not experiencing any suicidal ideations prior to yesterday.  Explains he started to write a suicide note but decided to come to ED. He states he was also concerned because his sleep was poor and worried that sleep deprivation was going to cause him to feel worse. In addition to depression he also endorses increased tendency to worry recently. Endorses some neuro-vegetative symptoms as below.  Denies psychotic symptoms. Does not endorse any specific stressors,  triggers for worsening depression, although states that COVID epidemic and resulting social restrictions have contributed . He also reports he separated July 2019 and now lives alone , * Of note, patient had presented to falling from a ladder in early May, sustaining head injury. At the time a head CT scan was done- negative. He was also found to have elevated Creatinine at 2.42. (Now improved to 1.36, GFR >60).  Patient reports long term (years) management with Xanax, which is prescribed at 2 mgrs TID. States he last took Xanax prior to going to ED. At this time he is not presenting with BZD WDL symptoms- no tremors, no restlessness, vitals stable ( 135/81, pulse 60) . He does report some diaphoresis which he states is chronic, not related to WDL.   After the above admission evaluation, it was recommended based on his presenting symptoms that Raymel will benefit from mood stabilization treatment. And with his consent, he was started on the medication regimen that targeted those presenting symptoms. He was instructed & explained the benefit/adverse effects of the medication in use. He was given the time to ask questions and voice any concerns that he may have. He received, stabilized & was discharged on the medications as listed below on his discharge medication lists. He was also enrolled & participated in the group counseling sessions being offered & held on this unit. He learned coping skills that should help him after discharge to cope better & maintain mood stability.  Lukka's symptoms responded well to his treatment regimen. This is evidenced by his reports of improved mood, absence of suicidal ideations, homicidal ideations & or AV hallucinations. He is currently mentally & medically stable to be discharged to continue routine mental health care & medication management as recommended below.   During the course of his hospitalization, the 15-minute checks were adequate to ensure patient's safety.  Patient did not display any dangerous, violent or suicidal behavior on the unit.  He interacted with patients & staff appropriately and participated appropriately in the group therapy sessions.  His medications were addressed & adjusted to meet his needs.  At the time of discharge patient is not reporting any acute suicidal ideation & feels more confident about his self-care and managing suicidal thoughts/mental health. Denies homicidal ideations.  Education and supportive counseling provided. He was able to engage in safety planning including plan to return to [facility] or contact emergency services if he feels unable to maintain his own safety or the safety of others. Pt had no further questions, comments, or concerns. He left [facility] with all personal belongings in no apparent distress. Transportation per the family (father).  Physical Findings: AIMS: Facial and Oral Movements Muscles of Facial Expression: None, normal Lips and Perioral Area: None, normal Jaw: None, normal Tongue: None, normal,Extremity Movements Upper (arms, wrists, hands,  fingers): None, normal Lower (legs, knees, ankles, toes): None, normal, Trunk Movements Neck, shoulders, hips: None, normal, Overall Severity Severity of abnormal movements (highest score from questions above): None, normal Incapacitation due to abnormal movements: None, normal Patient's awareness of abnormal movements (rate only patient's report): No Awareness, Dental Status Current problems with teeth and/or dentures?: No Does patient usually wear dentures?: No  CIWA:  CIWA-Ar Total: 2 COWS:  COWS Total Score: 2  Musculoskeletal: Strength & Muscle Tone: within normal limits Gait & Station: normal Patient leans: N/A  Psychiatric Specialty Exam: Physical Exam  Nursing note and vitals reviewed. Constitutional: He is oriented to person, place, and time. He appears well-developed.  HENT:  Head: Normocephalic.  Cardiovascular: Normal rate.  Respiratory: Effort  normal.  Genitourinary:    Genitourinary Comments: Deferred   Musculoskeletal:        General: Normal range of motion.     Cervical back: Normal range of motion.  Neurological: He is alert and oriented to person, place, and time.  Skin: Skin is warm and dry.    Review of Systems  Constitutional: Negative for chills, diaphoresis and fever.  HENT: Negative for congestion, rhinorrhea, sneezing and sore throat.   Eyes: Negative for discharge.  Respiratory: Negative for cough, chest tightness, shortness of breath and wheezing.   Cardiovascular: Negative for chest pain and palpitations.  Gastrointestinal: Negative for diarrhea, nausea and vomiting.  Genitourinary: Negative for difficulty urinating.  Musculoskeletal: Negative for arthralgias.  Skin: Negative.   Allergic/Immunologic:       Allergies: Tramadol  Neurological: Negative for dizziness, tremors, seizures, syncope, facial asymmetry, weakness, light-headedness, numbness and headaches.  Psychiatric/Behavioral: Positive for dysphoric mood (Stabilized with medication prior to discharge) and sleep disturbance. Negative for agitation, behavioral problems, confusion, decreased concentration, hallucinations, self-injury and suicidal ideas. The patient is not nervous/anxious and is not hyperactive.     Blood pressure 134/85, pulse 87, temperature 98.4 F (36.9 C), temperature source Oral, resp. rate 20, height 6\' 3"  (1.905 m), weight (!) 154.2 kg, SpO2 99 %.Body mass index is 42.5 kg/m.  See Md's discharge SRA  Sleep:  Number of Hours: 6.75   Have you used any form of tobacco in the last 30 days? (Cigarettes, Smokeless Tobacco, Cigars, and/or Pipes): No  Has this patient used any form of tobacco in the last 30 days? (Cigarettes, Smokeless Tobacco, Cigars, and/or Pipes): Yes, A prescription for an FDA-approved tobacco cessation medication was recommended at discharge.  Blood Alcohol level:  Lab Results  Component Value Date   ETH <10  04/15/2020   ETH <10 04/05/2020   Metabolic Disorder Labs:  No results found for: HGBA1C, MPG No results found for: PROLACTIN Lab Results  Component Value Date   CHOL 207 (H) 04/10/2020   TRIG 226.0 (H) 04/10/2020   HDL 30.70 (L) 04/10/2020   CHOLHDL 7 04/10/2020   VLDL 45.2 (H) 04/10/2020   LDLCALC 183 (H) 01/07/2019   See Psychiatric Specialty Exam and Suicide Risk Assessment completed by Attending Physician prior to discharge.  Discharge destination:  Home  Is patient on multiple antipsychotic therapies at discharge:  No   Has Patient had three or more failed trials of antipsychotic monotherapy by history:  No  Recommended Plan for Multiple Antipsychotic Therapies: NA  Allergies as of 04/17/2020      Reactions   Tramadol    ?serotonin syndrome      Medication List    TAKE these medications     Indication  acetaminophen 500 MG tablet  Commonly known as: TYLENOL Take 1,000 mg by mouth 2 (two) times daily as needed for mild pain.  Indication: Fever, Pain   alprazolam 2 MG tablet Commonly known as: XANAX Take 1 tablet (2 mg total) by mouth 2 (two) times daily. For anxiety What changed:   when to take this  reasons to take this  additional instructions  Indication: Feeling Anxious   levothyroxine 112 MCG tablet Commonly known as: SYNTHROID TAKE 1 TABLET BY MOUTH EVERY DAY What changed: when to take this  Indication: Underactive Thyroid   nicotine 21 mg/24hr patch Commonly known as: NICODERM CQ - dosed in mg/24 hours Place 1 patch (21 mg total) onto the skin daily. (may buy from over the counter): For smoking cessation Start taking on: Apr 18, 2020  Indication: Nicotine Addiction   sertraline 50 MG tablet Commonly known as: ZOLOFT Take 1 tablet (50 mg total) by mouth daily. For depression Start taking on: Apr 18, 2020  Indication: Major Depressive Disorder   traZODone 50 MG tablet Commonly known as: DESYREL Take 1 tablet (50 mg total) by mouth at  bedtime as needed for sleep.  Indication: Trouble Sleeping      Follow-up Information    Milagros Evener, MD Follow up on 04/21/2020.   Specialty: Psychiatry Why: You are scheduled for an appointment for medication management on 04/21/20 at 3:00 pm.  This will be a Virtual appointment.  Please call this provider asap to set up the virtual appointment.  Contact information: [address] [place] [address] [phone]         Follow-up recommendations: Activity:  As tolerated Diet: As recommended by your primary care doctor. Keep all scheduled follow-up appointments as recommended.   Comments: Prescriptions given at discharge.  Patient agreeable to plan.  Given opportunity to ask questions.  Appears to feel comfortable with discharge denies any current suicidal or homicidal thought. Patient is also instructed prior to discharge to: Take all medications as prescribed by his/her mental healthcare provider. Report any adverse effects and or reactions from the medicines to his/her outpatient provider promptly. Patient has been instructed & cautioned: To not engage in alcohol and or illegal drug use while on prescription medicines. In the event of worsening symptoms, patient is instructed to call the crisis hotline, 911 and or go to the nearest ED for appropriate evaluation and treatment of symptoms. To follow-up with his/her primary care provider for your other medical issues, concerns and or health care needs.  Signed: Armandina Stammer, NP, PMHNP, FNP-BC 04/17/2020, 10:29 AM   Patient seen, Suicide Assessment Completed.  Disposition Plan Reviewed

## 2020-04-17 NOTE — [facility] Counselor (Signed)
Adult Comprehensive Assessment  Patient ID: LEEAM CEDRONE, male   DOB: May 27, 1976, 44 y.o.   MRN: [id number]   Patient discharged within 72 hours of admission. CSW was unable to complete full psycho-social assessment. Patient will continue to follow up with his current psychiatrist for medication management services. There were no further questions or concerns.      Summary/Recommendations:   Summary and Recommendations (to be completed by the evaluator): Epic discharged within 72 hours of admission. CSW was unable to complete full psycho-social assessment. Connelly is a 44 year old male who is diagnosed with Major Depressive disorder. He presented to the hospital seeking treatment for suicidal ideation. Curvin reports that he plans to continue to follow up with his psychiatrist, Dr. Evelene Croon for medication management. He reports he is looking for a faith-based therapist on his own for additional support. Jemell can benefit from crisis stabilization, medication management, therapeutic milieu and referral services.  [name]. 04/17/2020

## 2020-04-17 NOTE — Progress Notes (Signed)
Recreation Therapy Notes  Date: 5.14.21 Time: 0930 Location: 300 [address] Dayroom  Group Topic: Stress Management  Goal Area(s) Addresses:  Patient will identify positive stress management techniques. Patient will identify benefits of using stress management post d/c.  Intervention: Stress Management  Activity: Progressive Muscle Relaxation.  LRT read a script that lead patients in tensing and relaxing each muscle group one at a time.  Patients were to follow along as LRT read script to engage in the activity.   Education:  Stress Management, Discharge Planning.   Education Outcome: Acknowledges Education  Clinical Observations/Feedback: Pt did not attend group session.     Caroll Rancher, LRT/CTRS    Lillia Abed, Haakon Titsworth A 04/17/2020 11:22 AM

## 2020-05-05 ENCOUNTER — Other Ambulatory Visit: Payer: Self-pay | Admitting: Family Medicine

## 2020-05-05 ENCOUNTER — Encounter: Payer: Self-pay | Admitting: Family Medicine

## 2020-05-06 MED ORDER — LEVOTHYROXINE SODIUM 112 MCG PO TABS: 112.0000 ug | ORAL_TABLET | Freq: Every day | ORAL | 0 refills | Status: DC

## 2020-06-09 ENCOUNTER — Other Ambulatory Visit: Payer: Self-pay

## 2020-06-09 ENCOUNTER — Other Ambulatory Visit (INDEPENDENT_AMBULATORY_CARE_PROVIDER_SITE_OTHER): Payer: Medicare Other

## 2020-06-09 DIAGNOSIS — I10 Essential (primary) hypertension: Secondary | ICD-10-CM | POA: Diagnosis not present

## 2020-06-09 DIAGNOSIS — E039 Hypothyroidism, unspecified: Secondary | ICD-10-CM | POA: Diagnosis not present

## 2020-06-10 ENCOUNTER — Ambulatory Visit: Payer: Medicare Other | Admitting: Family Medicine

## 2020-06-10 LAB — BASIC METABOLIC PANEL
BUN: 13 mg/dL (ref 6–23)
CO2: 27 mEq/L (ref 19–32)
Calcium: 9.6 mg/dL (ref 8.4–10.5)
Chloride: 101 mEq/L (ref 96–112)
Creatinine, Ser: 1.09 mg/dL (ref 0.40–1.50)
GFR: 73.99 mL/min (ref >60.00)
Glucose, Bld: 87 mg/dL (ref 70–99)
Potassium: 4.3 mEq/L (ref 3.5–5.1)
Sodium: 137 mEq/L (ref 135–145)

## 2020-06-10 LAB — TSH: TSH: 2.36 u[IU]/mL (ref 0.35–4.50)

## 2020-06-24 DIAGNOSIS — S86091D Other specified injury of right Achilles tendon, subsequent encounter: Secondary | ICD-10-CM | POA: Diagnosis not present

## 2020-06-28 ENCOUNTER — Encounter: Payer: Self-pay | Admitting: Family Medicine

## 2020-06-29 ENCOUNTER — Telehealth (INDEPENDENT_AMBULATORY_CARE_PROVIDER_SITE_OTHER): Payer: Medicare Other | Admitting: Family Medicine

## 2020-06-29 ENCOUNTER — Encounter: Payer: Self-pay | Admitting: Family Medicine

## 2020-06-29 VITALS — BP 107/81 | HR 99

## 2020-06-29 DIAGNOSIS — R140 Abdominal distension (gaseous): Secondary | ICD-10-CM

## 2020-06-29 NOTE — Progress Notes (Signed)
Subjective:    Patient ID: Brian Rowe, male    DOB: [birth year], 44 y.o.   MRN: [id number]  HPI Here asking about some GI discomfort he has had for several months. He describes generalized mild abdominal pains and bloating for the past 2 months. His BMs are regular, no nausea or fever. His appetite has decreased and he has lost some weight. He tries to get plenty of fieber in his diet and he drinks plenty of water.  Virtual Visit via Video Note  I connected with the patient on 06/29/20 at 10:15 AM EDT by a video enabled telemedicine application and verified that I am speaking with the correct person using two identifiers.  Location patient: home Location provider:work or home office Persons participating in the virtual visit: patient, provider  I discussed the limitations of evaluation and management by telemedicine and the availability of in person appointments. The patient expressed understanding and agreed to proceed.   HPI:    ROS: See pertinent positives and negatives per HPI.  Past Medical History:  Diagnosis Date  . ANXIETY 07/19/2007  . DEPRESSION 07/19/2007  . HEMORRHOIDS, INTERNAL 12/01/2008  . HYPERLIPIDEMIA 07/19/2007  . HYPERTENSION 07/19/2007  . Sleep apnea     Past Surgical History:  Procedure Laterality Date  . left ankle Left   . NASAL SEPTUM SURGERY    . TONSILLECTOMY    . UVULOPALATOPHARYNGOPLASTY (UPPP)/TONSILLECTOMY/SEPTOPLASTY      Family History  Problem Relation Age of Onset  . Hypertension Father   . Anxiety disorder Father   . Obesity Mother   . Sleep apnea Mother   . Rheum arthritis Sister   . Alzheimer's disease Maternal Grandmother   . Stroke Maternal Grandfather   . Alzheimer's disease Paternal Grandmother   . Pancreatic cancer Paternal Grandfather      Current Outpatient Medications:  .  acetaminophen (TYLENOL) 500 MG tablet, Take 1,000 mg by mouth 2 (two) times daily as needed for mild pain., Disp: , Rfl:  .  ALPRAZolam  (XANAX) 2 MG tablet, Take 1 tablet (2 mg total) by mouth 2 (two) times daily. For anxiety, Disp: 8 tablet, Rfl: 0 .  aspirin EC 81 MG tablet, Take 81 mg by mouth daily. Swallow whole., Disp: , Rfl:  .  levothyroxine (SYNTHROID) 112 MCG tablet, Take 1 tablet (112 mcg total) by mouth daily., Disp: 90 tablet, Rfl: 0 .  Multiple Vitamins-Minerals (MULTIVITAMIN ADULTS PO), Take by mouth., Disp: , Rfl:  .  sertraline (ZOLOFT) 100 MG tablet, Take 100 mg by mouth daily., Disp: , Rfl:  .  traZODone (DESYREL) 50 MG tablet, Take 1 tablet (50 mg total) by mouth at bedtime as needed for sleep., Disp: 15 tablet, Rfl: 0  EXAM:  VITALS per patient if applicable:  GENERAL: alert, oriented, appears well and in no acute distress  HEENT: atraumatic, conjunttiva clear, no obvious abnormalities on inspection of external nose and ears  NECK: normal movements of the head and neck  LUNGS: on inspection no signs of respiratory distress, breathing rate appears normal, no obvious gross SOB, gasping or wheezing  CV: no obvious cyanosis  MS: moves all visible extremities without noticeable abnormality  PSYCH/NEURO: pleasant and cooperative, no obvious depression or anxiety, speech and thought processing grossly intact  ASSESSMENT AND PLAN: Abdominal bloating. We will refer him to GI to evaluate further.  Gershon Crane, MD  Discussed the following assessment and plan:  No diagnosis found.     I discussed the assessment and treatment  plan with the patient. The patient was provided an opportunity to ask questions and all were answered. The patient agreed with the plan and demonstrated an understanding of the instructions.   The patient was advised to call back or seek an in-person evaluation if the symptoms worsen or if the condition fails to improve as anticipated.     Review of Systems     Objective:   Physical Exam        Assessment & Plan:

## 2020-06-29 NOTE — Telephone Encounter (Signed)
Spoke with the pt and informed him a visit is recommended since the abdominal pain is a new problem.  Appt scheduled for a virtual visit with Dr Clent Ridges today at 10:15am.

## 2020-07-01 ENCOUNTER — Ambulatory Visit: Payer: Medicare Other | Admitting: Internal Medicine

## 2020-07-01 IMAGING — CR DG FOOT COMPLETE 3+V*L*
3 series · 3 of 3 positions shown · non-contrast
Comparison: None.

CLINICAL DATA: Fall

EXAM:
LEFT FOOT - COMPLETE 3+ VIEW

[foot ap]
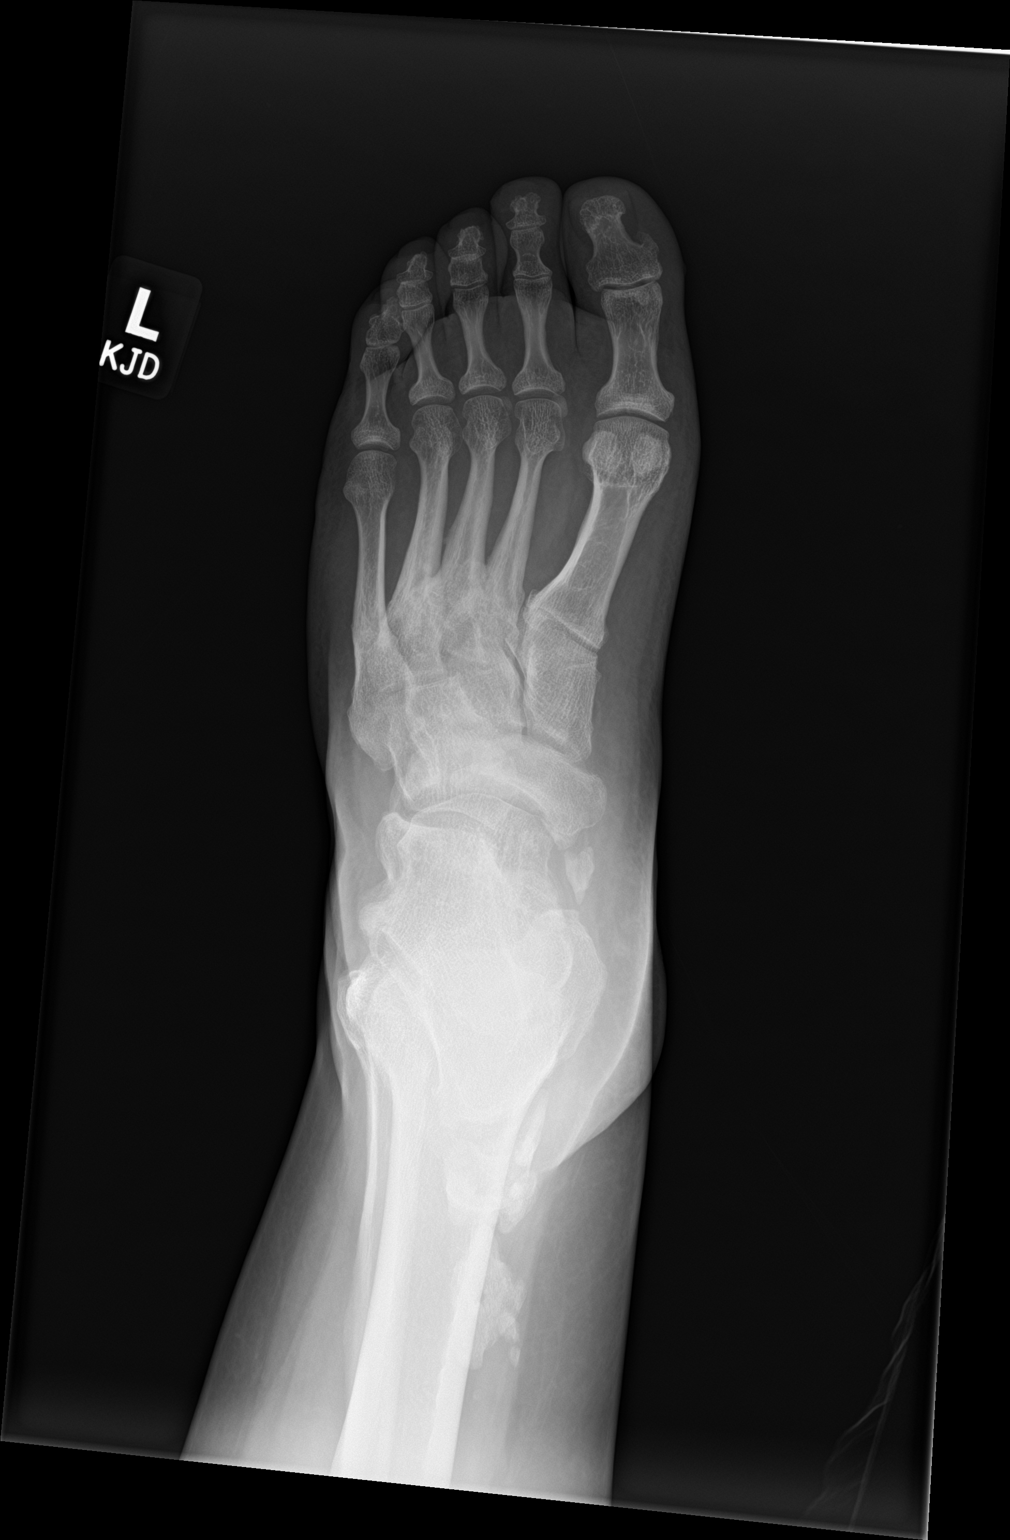

[foot obl]
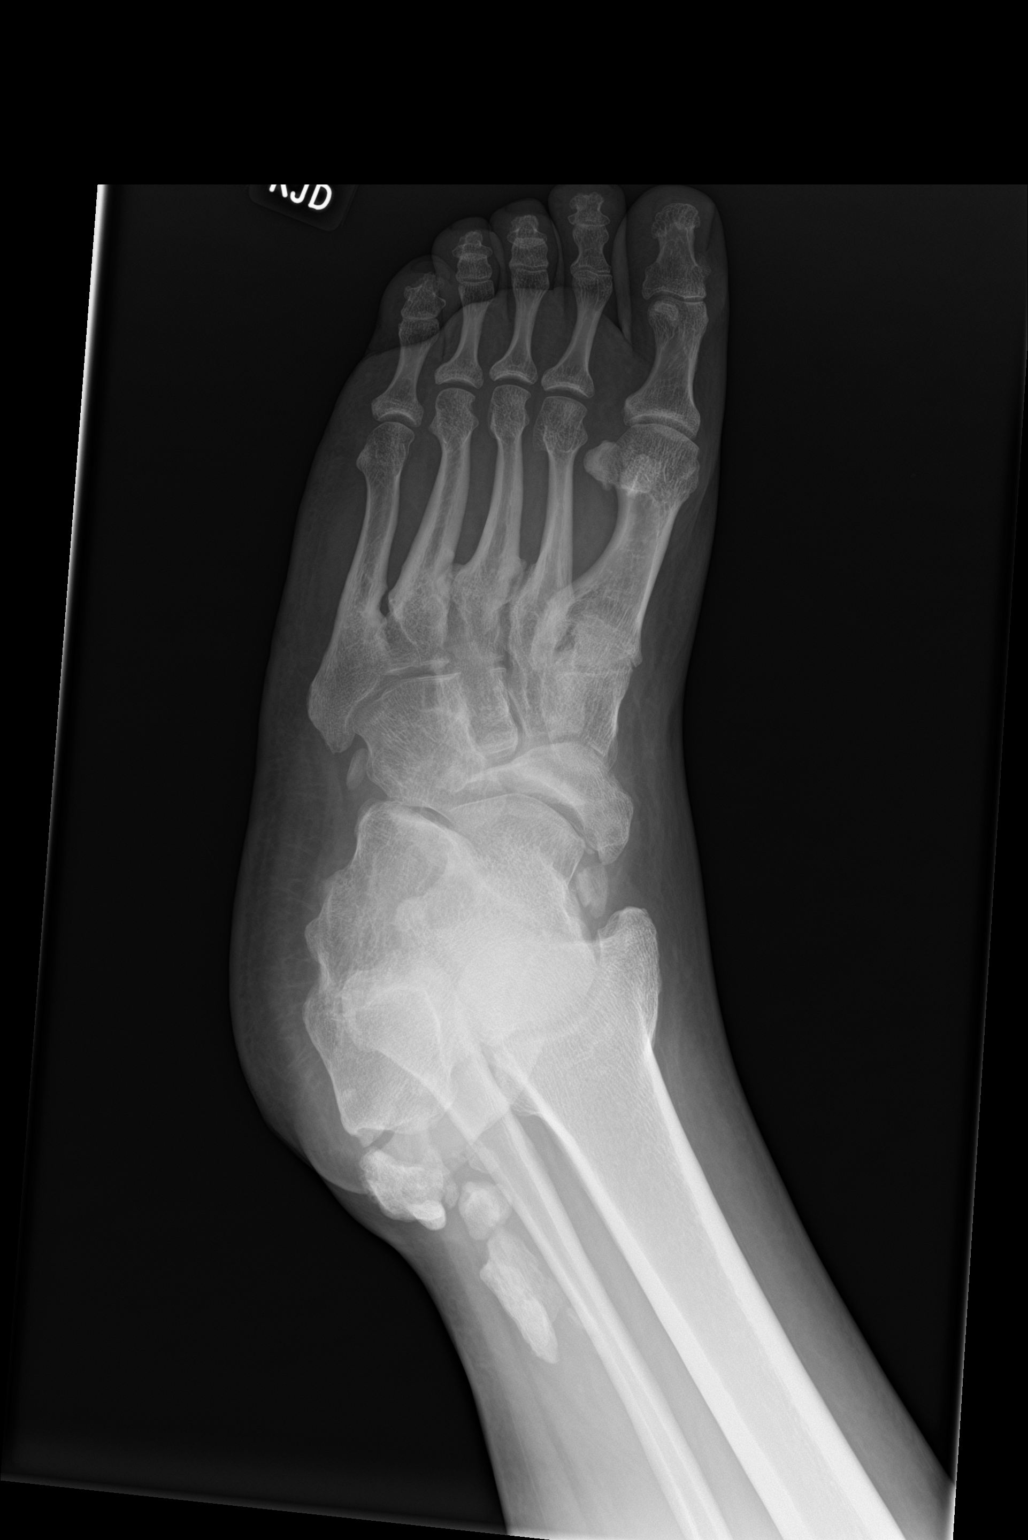

[foot lat]
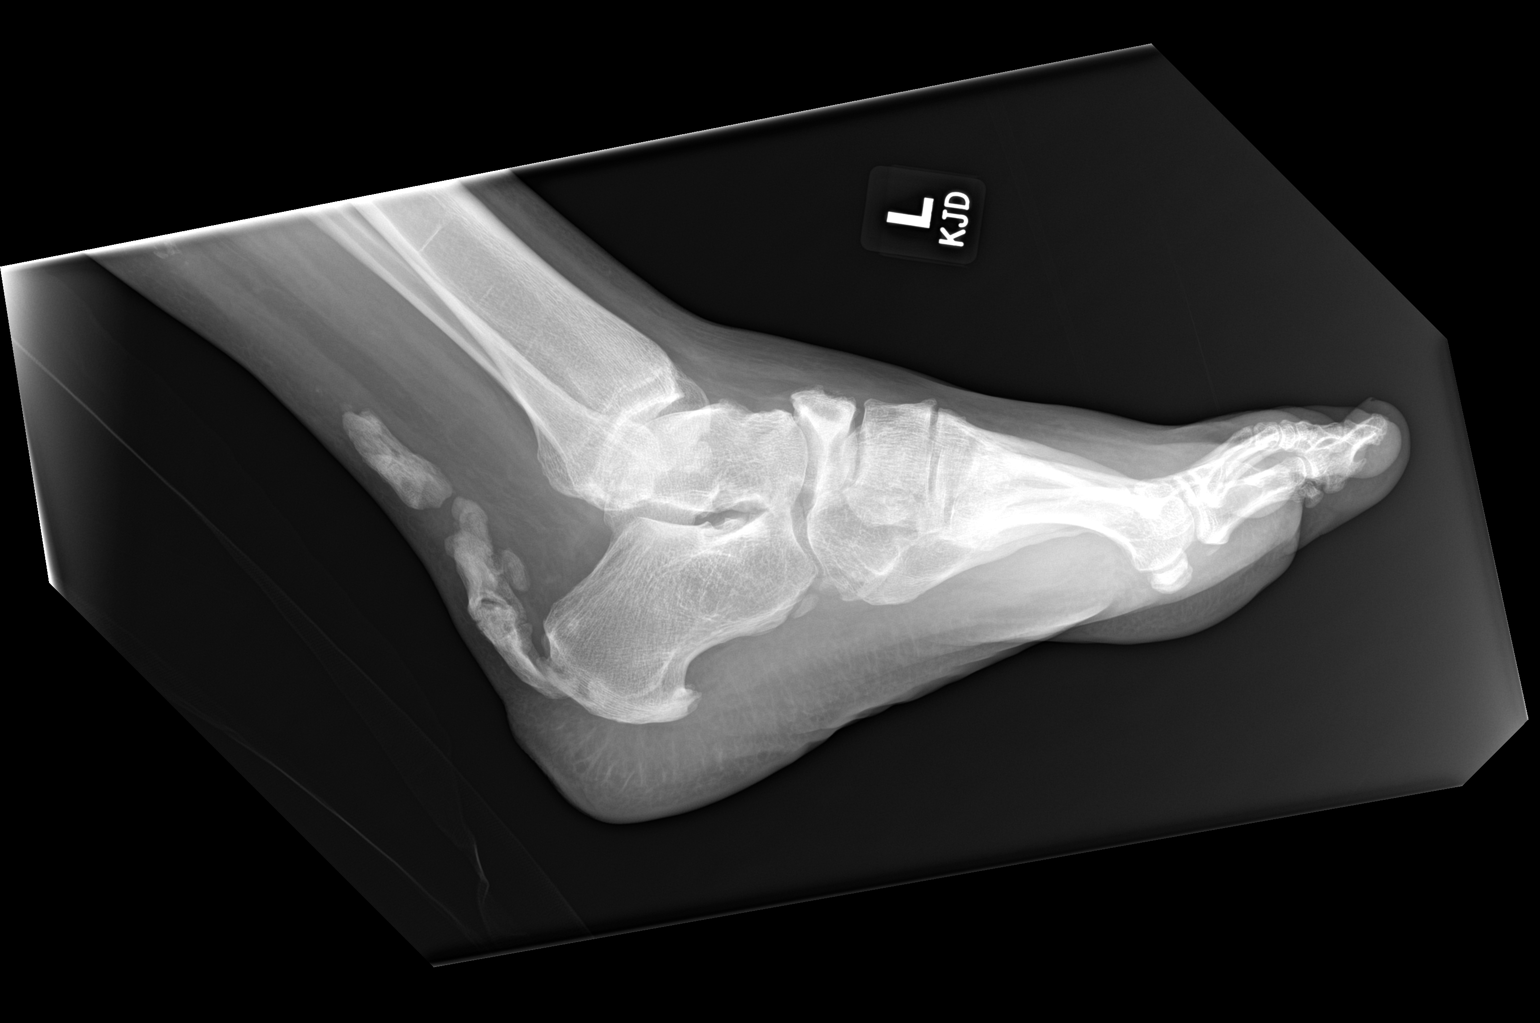

[3 of 3 positions shown; findings below may reference images not displayed]

FINDINGS: There is no acute fracture or dislocation. Prominent ossification
dorsally along the Achilles. Abnormal appearance of the navicular,
which may reflect sequelae of osteonecrosis.
IMPRESSION: No acute fracture.

## 2020-07-01 IMAGING — CR DG LUMBAR SPINE COMPLETE 4+V
6 series · 6 of 6 positions shown · non-contrast
Comparison: None.

CLINICAL DATA: Fall

EXAM:
LUMBAR SPINE - COMPLETE 4+ VIEW

[l-spine ap]
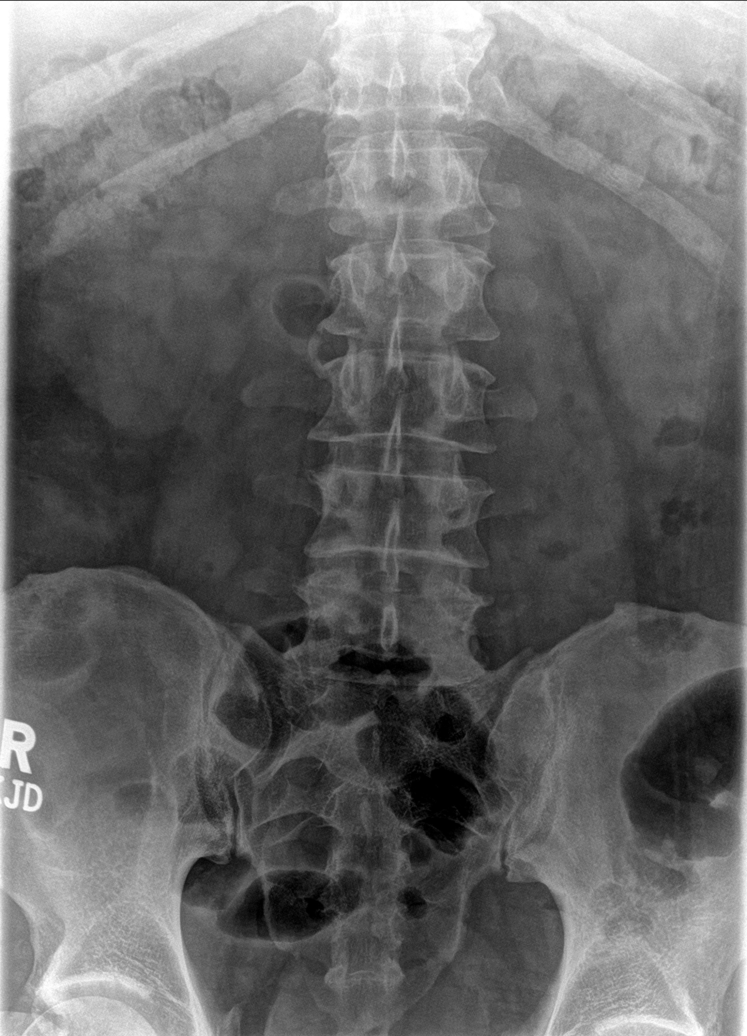

[l-spine obl (1 of 2)]
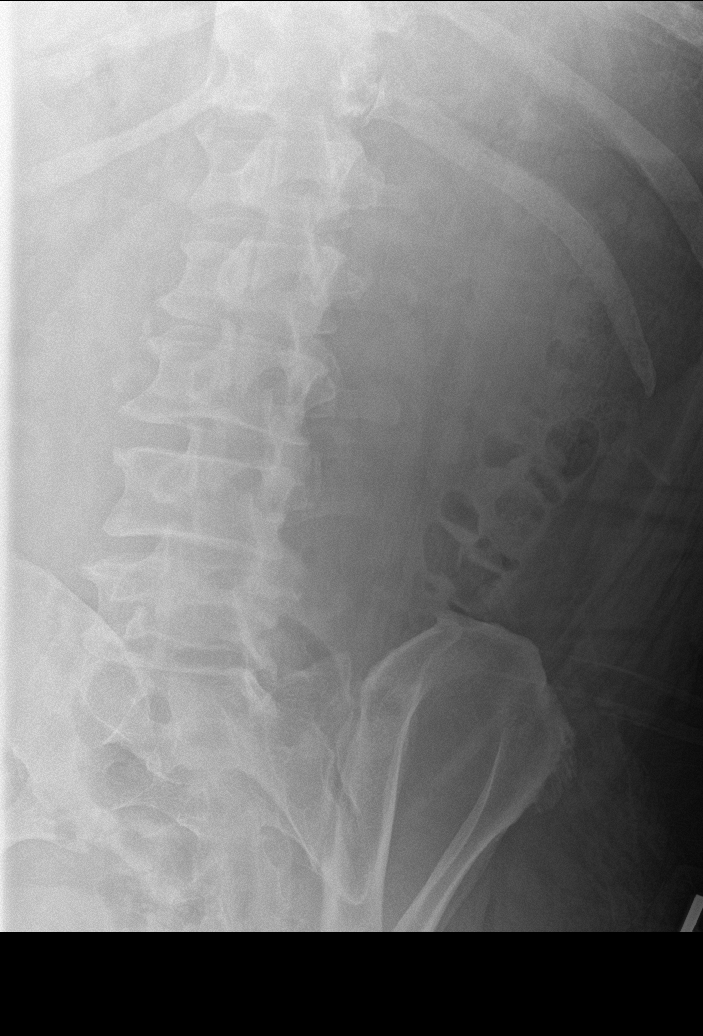

[l-spine obl (2 of 2)]
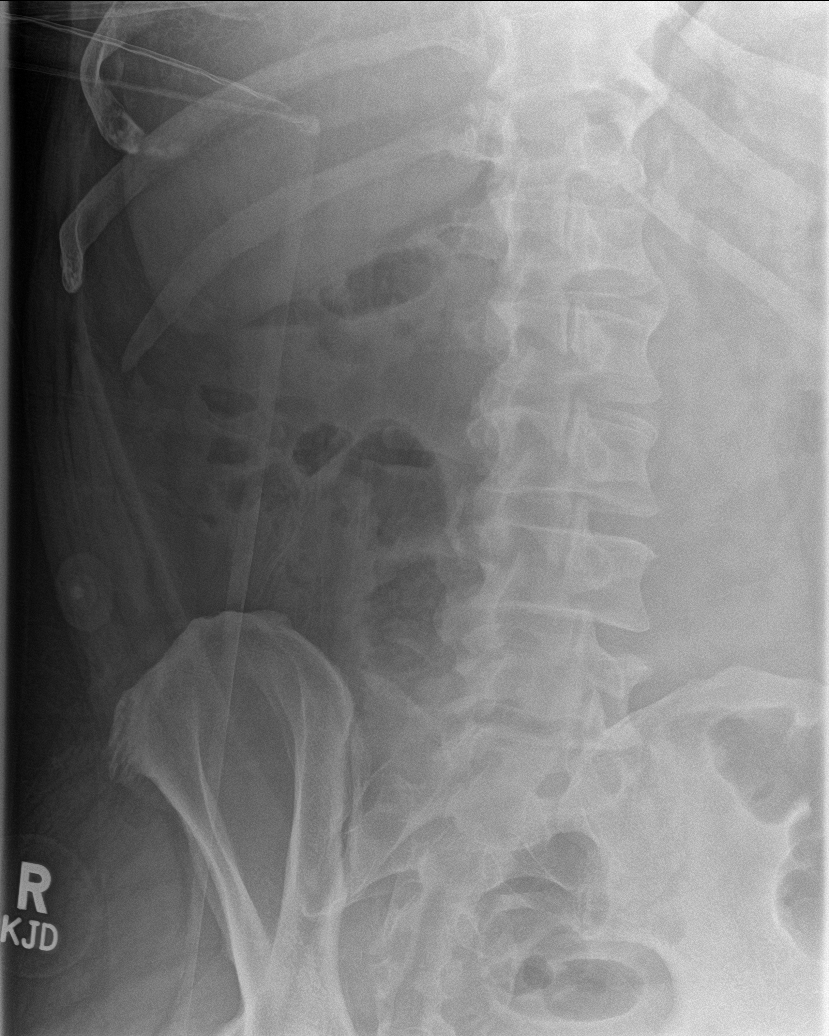

[l-spine lat (1 of 2)]
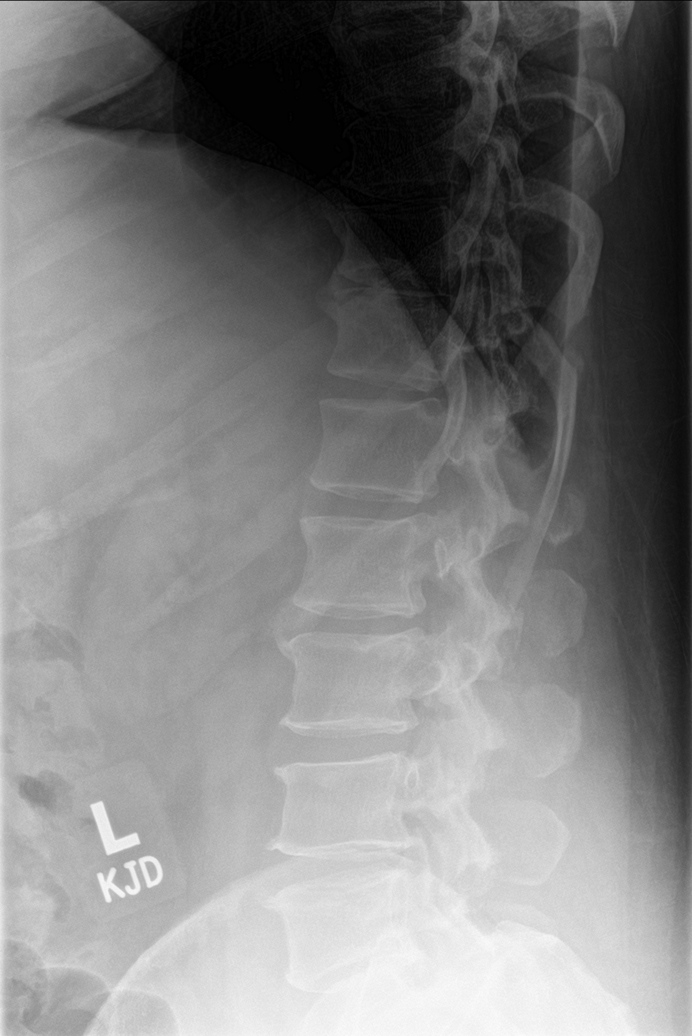

[l-spine spot]
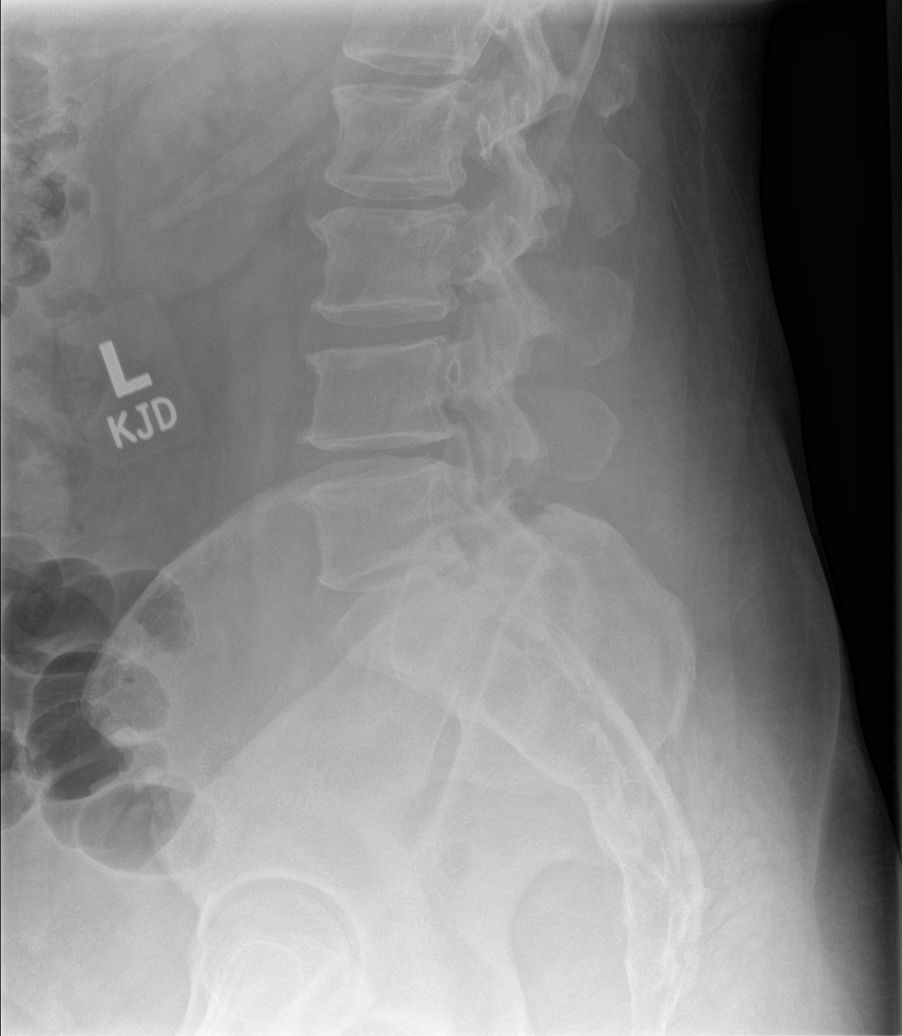

[l-spine lat (2 of 2)]
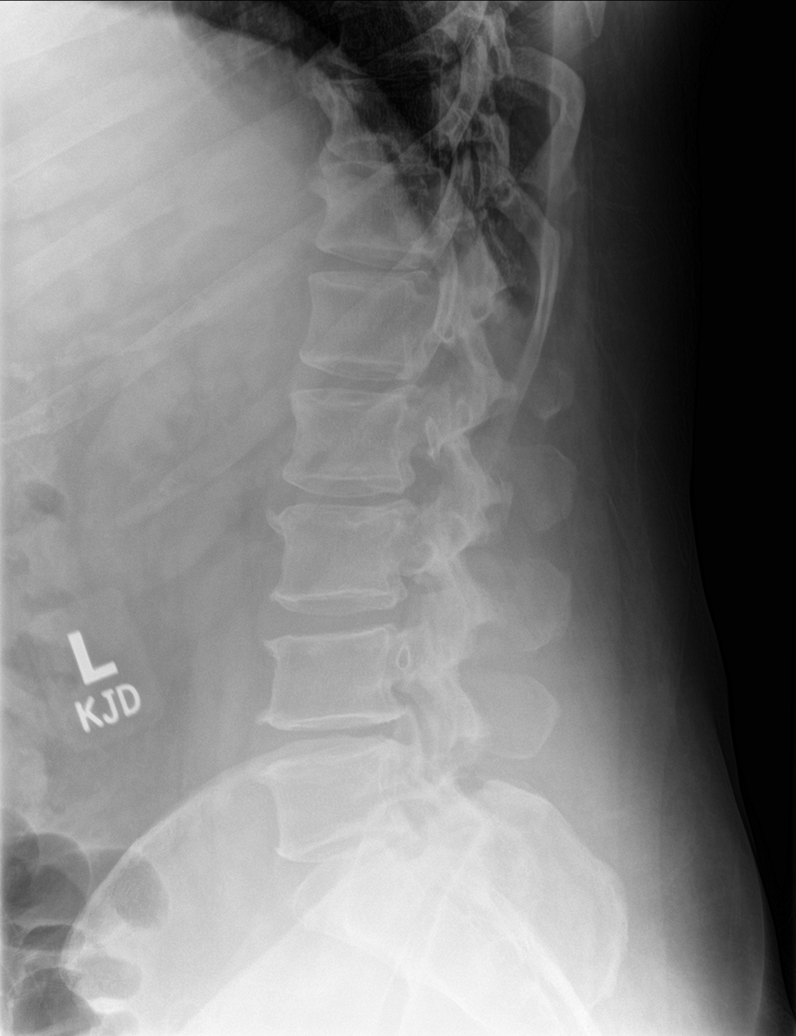

[6 of 6 positions shown; findings below may reference images not displayed]

FINDINGS: There is no evidence of lumbar spine fracture. Alignment is normal.
Intervertebral disc spaces are maintained. Multilevel degenerative
endplate changes.
IMPRESSION: Normal lumbar spine alignment.  No acute fracture.

## 2020-07-08 DIAGNOSIS — S86091D Other specified injury of right Achilles tendon, subsequent encounter: Secondary | ICD-10-CM | POA: Diagnosis not present

## 2020-07-23 DIAGNOSIS — S86091D Other specified injury of right Achilles tendon, subsequent encounter: Secondary | ICD-10-CM | POA: Diagnosis not present

## 2020-07-23 DIAGNOSIS — M25572 Pain in left ankle and joints of left foot: Secondary | ICD-10-CM | POA: Diagnosis not present

## 2020-07-26 ENCOUNTER — Other Ambulatory Visit: Payer: Self-pay | Admitting: Family Medicine

## 2020-08-05 ENCOUNTER — Encounter: Payer: Medicare Other | Admitting: Family Medicine

## 2020-08-21 DIAGNOSIS — M25572 Pain in left ankle and joints of left foot: Secondary | ICD-10-CM | POA: Diagnosis not present

## 2020-08-21 DIAGNOSIS — S86091D Other specified injury of right Achilles tendon, subsequent encounter: Secondary | ICD-10-CM | POA: Diagnosis not present

## 2020-08-27 DIAGNOSIS — R269 Unspecified abnormalities of gait and mobility: Secondary | ICD-10-CM | POA: Diagnosis not present

## 2020-08-27 DIAGNOSIS — M25571 Pain in right ankle and joints of right foot: Secondary | ICD-10-CM | POA: Diagnosis not present

## 2020-08-28 DIAGNOSIS — M545 Low back pain: Secondary | ICD-10-CM | POA: Diagnosis not present

## 2020-09-07 ENCOUNTER — Other Ambulatory Visit: Payer: Self-pay | Admitting: Orthopedic Surgery

## 2020-09-07 DIAGNOSIS — M545 Low back pain, unspecified: Secondary | ICD-10-CM

## 2020-09-18 DIAGNOSIS — M545 Low back pain, unspecified: Secondary | ICD-10-CM | POA: Diagnosis not present

## 2020-09-23 ENCOUNTER — Ambulatory Visit: Payer: Medicare Other | Admitting: Physician Assistant

## 2020-09-24 ENCOUNTER — Ambulatory Visit (INDEPENDENT_AMBULATORY_CARE_PROVIDER_SITE_OTHER): Payer: Medicare Other | Admitting: Internal Medicine

## 2020-09-24 ENCOUNTER — Encounter: Payer: Self-pay | Admitting: Internal Medicine

## 2020-09-24 VITALS — BP 124/72 | HR 83 | Ht 75.0 in | Wt 312.0 lb

## 2020-09-24 DIAGNOSIS — R194 Change in bowel habit: Secondary | ICD-10-CM | POA: Diagnosis not present

## 2020-09-24 DIAGNOSIS — F419 Anxiety disorder, unspecified: Secondary | ICD-10-CM | POA: Diagnosis not present

## 2020-09-24 DIAGNOSIS — R1032 Left lower quadrant pain: Secondary | ICD-10-CM

## 2020-09-24 DIAGNOSIS — R1319 Other dysphagia: Secondary | ICD-10-CM | POA: Diagnosis not present

## 2020-09-24 MED ORDER — SUTAB 1479-225-188 MG PO TABS
1.0000 | ORAL_TABLET | Freq: Once | ORAL | 0 refills | Status: AC
Start: 2020-09-24 — End: 2020-09-24

## 2020-09-24 NOTE — Progress Notes (Signed)
HISTORY OF PRESENT ILLNESS:  Brian Rowe is a 44 y.o. 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  Patient reports that he fell off of a ladder in April 2021.  Thereafter he has had some problems with back pain.  In addition she noted a bulge or fullness in the left lower quadrant of his abdomen.  Uncomfortable sensation.  He reports that he had significant weight loss unintentionally.  Has gained some weight back.  He knows that his bowel habits are less frequent.  No bleeding.  This is unusual for him, he states.  He has had decreased appetite intermittently.  Finally, he reports intermittent dysphagia for several months duration.  Generally solids which feel like they are "hanging up".  He denies classic reflux symptoms.  He does take Xanax for his anxiety.  Also aspirin and meloxicam.  He did visit with his primary care provider the medicine few months ago.  This appointment arranged.  Blood work from June 09, 2020 shows unremarkable basic metabolic panel.  Blood work from May 2021 reveals elevated ALT.  Otherwise normal.  CBC with hemoglobin 17.3.  X-rays reviewed.  No relevant abnormalities to report.  REVIEW OF SYSTEMS:  All non-GI ROS negative unless otherwise stated in the HPI except for back pain, arthritis, anxiety  Past Medical History:  Diagnosis Date  . ANXIETY 07/19/2007  . Arthritis   . DEPRESSION 07/19/2007  . HEMORRHOIDS, INTERNAL 12/01/2008  . HYPERLIPIDEMIA 07/19/2007  . HYPERTENSION 07/19/2007  . Sleep apnea     Past Surgical History:  Procedure Laterality Date  . INGUINAL HERNIA REPAIR Right   . left ankle Left   . NASAL SEPTUM SURGERY    . TONSILLECTOMY    . UVULOPALATOPHARYNGOPLASTY (UPPP)/TONSILLECTOMY/SEPTOPLASTY      Social History ELIAH MARQUARD  reports that he quit smoking about 8 years ago. He has never used  smokeless tobacco. He reports previous alcohol use of about 1.0 standard drink of alcohol per week. He reports that he does not use drugs.  family history includes Alzheimer's disease in his maternal grandmother and paternal grandmother; Anxiety disorder in his father; Hypertension in his father; Obesity in his mother; Pancreatic cancer in his paternal grandfather; Prostate cancer in his maternal uncle; Rheum arthritis in his sister; Sleep apnea in his mother; Stroke in his maternal grandfather.  Allergies  Allergen Reactions  . Tramadol     ?serotonin syndrome       PHYSICAL EXAMINATION: Vital signs: BP 124/72   Pulse 83   Ht 6\' 3"  (1.905 m)   Wt (!) 312 lb (141.5 kg)   BMI 39.00 kg/m   Constitutional: generally well-appearing, no acute distress Psychiatric: alert and oriented x3, cooperative Eyes: extraocular movements intact, anicteric, conjunctiva pink Mouth: oral pharynx moist, no lesions Neck: supple no lymphadenopathy Cardiovascular: heart regular rate and rhythm, no murmur Lungs: clear to auscultation bilaterally Abdomen: soft, nontender, nondistended, no obvious ascites, no peritoneal signs, normal bowel sounds, no organomegaly Rectal: Deferred to colonoscopy Extremities: no clubbing, cyanosis, or lower extremity edema bilaterally.  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  Cranial nerves intact  ASSESSMENT:  1.  78-month history of persistent left-sided discomfort.  Not true pain.  Abnormal sensation.  Nothing noted on exam 2.  Change in bowel habits with new onset constipation will decrease frequency 3.  Several month history of intermittent solid food dysphagia. Stricture 4.  Weight loss associated with decreased appetite.  Rule out organic process such as gastrointestinal malignancy   PLAN:  1.  Schedule colonoscopy to evaluate abdominal discomfort, change in bowel habits, and weight loss.The  nature of the procedure, as well as the risks, benefits, and alternatives were carefully and thoroughly reviewed with the patient. Ample time for discussion and questions allowed. The patient understood, was satisfied, and agreed to proceed. 2.  Schedule upper endoscopy to evaluate intermittent dysphagia and weight loss.The nature of the procedure, as well as the risks, benefits, and alternatives were carefully and thoroughly reviewed with the patient. Ample time for discussion and questions allowed. The patient understood, was satisfied, and agreed to proceed. 3.  Further recommendations after the above.  May need advanced imaging, like CT, if endoscopic evaluations are unremarkable

## 2020-09-24 NOTE — Patient Instructions (Signed)
You have been scheduled for an endoscopy and colonoscopy. Please follow the written instructions given to you at your visit today. Please pick up your prep supplies at the pharmacy within the next 1-3 days. If you use inhalers (even only as needed), please bring them with you on the day of your procedure.  

## 2020-09-25 ENCOUNTER — Telehealth: Payer: Self-pay

## 2020-09-25 NOTE — Telephone Encounter (Signed)
Patient called in wanting Dr to give some advice on what he should do. Woke up with a draining ear and earache two days ago. That started a congested nose. No fever or body aches or chills. Patient has been taking mucin ex and this hasn't helped any. Wanting to know any recommendations. Patient is also complaining it hurts to lay on that side. It is the right ear   Please call and advise

## 2020-09-26 ENCOUNTER — Other Ambulatory Visit: Payer: Medicare Other

## 2020-09-28 NOTE — Telephone Encounter (Signed)
Left a message for the pt to return my call.  

## 2020-09-29 NOTE — Telephone Encounter (Signed)
Left a message for the pt to return my call.  

## 2020-10-16 DIAGNOSIS — M5459 Other low back pain: Secondary | ICD-10-CM | POA: Diagnosis not present

## 2020-10-16 DIAGNOSIS — M5416 Radiculopathy, lumbar region: Secondary | ICD-10-CM | POA: Diagnosis not present

## 2020-10-26 DIAGNOSIS — M5459 Other low back pain: Secondary | ICD-10-CM | POA: Diagnosis not present

## 2020-10-26 DIAGNOSIS — M545 Low back pain, unspecified: Secondary | ICD-10-CM | POA: Diagnosis not present

## 2020-10-26 DIAGNOSIS — Z23 Encounter for immunization: Secondary | ICD-10-CM | POA: Diagnosis not present

## 2020-10-27 DIAGNOSIS — Z23 Encounter for immunization: Secondary | ICD-10-CM | POA: Diagnosis not present

## 2020-11-24 DIAGNOSIS — M5416 Radiculopathy, lumbar region: Secondary | ICD-10-CM | POA: Diagnosis not present

## 2020-11-26 ENCOUNTER — Encounter: Payer: Medicare Other | Admitting: Internal Medicine

## 2020-12-11 ENCOUNTER — Telehealth: Payer: Self-pay | Admitting: Family Medicine

## 2020-12-11 NOTE — Telephone Encounter (Signed)
Spoke to pt to  schedule Medicare Annual Wellness Visit (AWV) either virtually or in office.  Pt stated he will call back to schedule   Last AWV ; please schedule at anytime with [facility] Nurse Health Advisor 1 or 2   This should be a 45 minute visit.

## 2020-12-24 ENCOUNTER — Encounter: Payer: Medicare Other | Admitting: Internal Medicine

## 2021-01-08 DIAGNOSIS — M47816 Spondylosis without myelopathy or radiculopathy, lumbar region: Secondary | ICD-10-CM | POA: Diagnosis not present

## 2021-01-11 ENCOUNTER — Other Ambulatory Visit: Payer: Self-pay | Admitting: Family Medicine

## 2021-01-26 DIAGNOSIS — M47816 Spondylosis without myelopathy or radiculopathy, lumbar region: Secondary | ICD-10-CM | POA: Diagnosis not present

## 2021-02-11 ENCOUNTER — Telehealth: Payer: Self-pay | Admitting: Family Medicine

## 2021-02-11 NOTE — Telephone Encounter (Signed)
Left message for patient to call back and schedule Medicare Annual Wellness Visit (AWV) either virtually or in office. No detailed message left   AWVI  please schedule at anytime with [facility] Nurse Health Advisor 1 or 2   This should be a 45 minute visit. 

## 2021-02-22 ENCOUNTER — Encounter: Payer: Self-pay | Admitting: Family Medicine

## 2021-02-23 ENCOUNTER — Other Ambulatory Visit: Payer: Self-pay | Admitting: Family Medicine

## 2021-02-23 MED ORDER — TADALAFIL 10 MG PO TABS: 10.0000 mg | ORAL_TABLET | Freq: Every day | ORAL | 5 refills | Status: DC | PRN

## 2021-03-02 NOTE — Progress Notes (Unsigned)
Subjective:   Brian Rowe is a 45 y.o. male who presents for an Initial Medicare Annual Wellness Visit.  Review of Systems    N/A       Objective:    There were no vitals filed for this visit. There is no height or weight on file to calculate BMI.  Advanced Directives 04/15/2020 04/15/2020 04/04/2020 01/26/2018 09/04/2017 07/07/2017 05/08/2017  Does Patient Have a Medical Advance Directive? No No No No Yes No No  Would patient like information on creating a medical advance directive? No - Patient declined No - Patient declined No - Patient declined - - - -  Some encounter information is confidential and restricted. Go to Review Flowsheets activity to see all data.    Current Medications (verified) Outpatient Encounter Medications as of 03/03/2021  Medication Sig  . ALPRAZolam (XANAX) 2 MG tablet Take 1 tablet (2 mg total) by mouth 2 (two) times daily. For anxiety  . aspirin EC 81 MG tablet Take 81 mg by mouth daily. Swallow whole.  . levothyroxine (SYNTHROID) 112 MCG tablet TAKE ONE TABLET BY MOUTH DAILY  . meloxicam (MOBIC) 15 MG tablet Take 15 mg by mouth daily.  . Multiple Vitamins-Minerals (MULTIVITAMIN ADULTS PO) Take by mouth.  Marland Kitchen OVER THE COUNTER MEDICATION Beets vitamin powder daily  . OVER THE COUNTER MEDICATION Hemp protein/fiber powder as needed  . OVER THE COUNTER MEDICATION CBD - Kratom pill-  One pill every 3 days  . sertraline (ZOLOFT) 100 MG tablet Take 100 mg by mouth daily.  . tadalafil (CIALIS) 10 MG tablet Take 1 tablet (10 mg total) by mouth daily as needed for erectile dysfunction.  . traZODone (DESYREL) 50 MG tablet Take 1 tablet (50 mg total) by mouth at bedtime as needed for sleep.   No facility-administered encounter medications on file as of 03/03/2021.    Allergies (verified) Tramadol   History: Past Medical History:  Diagnosis Date  . ANXIETY 07/19/2007  . Arthritis   . DEPRESSION 07/19/2007  . HEMORRHOIDS, INTERNAL 12/01/2008  . HYPERLIPIDEMIA  07/19/2007  . HYPERTENSION 07/19/2007  . Sleep apnea    Past Surgical History:  Procedure Laterality Date  . INGUINAL HERNIA REPAIR Right   . left ankle Left   . NASAL SEPTUM SURGERY    . TONSILLECTOMY    . UVULOPALATOPHARYNGOPLASTY (UPPP)/TONSILLECTOMY/SEPTOPLASTY     Family History  Problem Relation Age of Onset  . Hypertension Father   . Anxiety disorder Father   . Obesity Mother   . Sleep apnea Mother   . Rheum arthritis Sister   . Alzheimer's disease Maternal Grandmother   . Stroke Maternal Grandfather   . Alzheimer's disease Paternal Grandmother   . Pancreatic cancer Paternal Grandfather   . Prostate cancer Maternal Uncle   . Colon polyps Neg Hx   . Esophageal cancer Neg Hx   . Rectal cancer Neg Hx    Social History   Socioeconomic History  . Marital status: Married    Spouse name: Not on file  . Number of children: 2  . Years of education: Not on file  . Highest education level: Not on file  Occupational History  . Occupation: disabled  Tobacco Use  . Smoking status: Former Smoker    Quit date: 12/06/2011    Years since quitting: 9.2  . Smokeless tobacco: Never Used  Vaping Use  . Vaping Use: Never used  Substance and Sexual Activity  . Alcohol use: Not Currently    Alcohol/week: 1.0 standard  drink    Types: 1 Cans of beer per week  . Drug use: No  . Sexual activity: Yes  Other Topics Concern  . Not on file  Social History Narrative  . Not on file   Social Determinants of Health   Financial Resource Strain: Not on file  Food Insecurity: Not on file  Transportation Needs: Not on file  Physical Activity: Not on file  Stress: Not on file  Social Connections: Not on file    Tobacco Counseling Counseling given: Not Answered   Clinical Intake:                 Diabetic?No         Activities of Daily Living In your present state of health, do you have any difficulty performing the following activities: 04/15/2020  Hearing? N   Vision? N  Difficulty concentrating or making decisions? Y  Walking or climbing stairs? Y  Comment Hx of surgery to left ankle  Dressing or bathing? N  Some encounter information is confidential and restricted. Go to Review Flowsheets activity to see all data.  Some recent data might be hidden    Patient Care Team: Wynn Banker, MD as PCP - General (Family Medicine)  Indicate any recent Medical Services you may have received from other than [facility] providers in the past year (date may be approximate).     Assessment:   This is a routine wellness examination for Brian Rowe.  Hearing/Vision screen No exam data present  Dietary issues and exercise activities discussed:    Goals   None    Depression Screen PHQ 2/9 Scores 04/09/2018 02/23/2018 12/27/2017 10/31/2017 09/04/2017 04/04/2017 08/29/2016  PHQ - 2 Score 0 2 2 2  0 6 0  PHQ- 9 Score - - - - - - -    Fall Risk Fall Risk  04/09/2018 02/23/2018 12/27/2017 10/31/2017 09/04/2017  Falls in the past year? No Yes Yes No No  Comment - 11/2017 - - -  Number falls in past yr: - 1 1 - -  Injury with Fall? - - Yes - -  Risk for fall due to : - Impaired balance/gait Impaired balance/gait - -  Follow up - - - - -    FALL RISK PREVENTION PERTAINING TO THE HOME:  Any stairs in or around the home? {YES/NO:21197} If so, are there any without handrails? Yes  Home free of loose throw rugs in walkways, pet beds, electrical cords, etc? Yes  Adequate lighting in your home to reduce risk of falls? {YES/NO:21197}  ASSISTIVE DEVICES UTILIZED TO PREVENT FALLS:  Life alert? {YES/NO:21197} Use of a cane, walker or w/c? {YES/NO:21197} Grab bars in the bathroom? {YES/NO:21197} Shower chair or bench in shower? {YES/NO:21197} Elevated toilet seat or a handicapped toilet? {YES/NO:21197}  TIMED UP AND GO:  Was the test performed? {YES/NO:21197}.  Length of time to ambulate 10 feet: *** sec.   {Appearance of 12/2017  Cognitive Function:         Immunizations Immunization History  Administered Date(s) Administered  . Influenza Inj Mdck Quad With Preservative 10/19/2017  . Influenza Split 09/02/2013  . Influenza,inj,Quad PF,6+ Mos 08/29/2016  . Influenza-Unspecified 08/06/2014, 12/19/2017, 09/04/2018  . Moderna Sars-Covid-2 Vaccination 03/10/2020, 04/08/2020  . Td 12/05/2002  . Tdap 08/04/2012    TDAP status: Up to date  Flu Vaccine status: Up to date   Pneumococcal vaccine status: Due, Education has been provided regarding the importance of this vaccine. Advised may receive this vaccine at local  pharmacy or Health Dept. Aware to provide a copy of the vaccination record if obtained from local pharmacy or Health Dept. Verbalized acceptance and understanding.  Covid-19 vaccine status: Completed vaccines  Qualifies for Shingles Vaccine? No   Zostavax completed No   Shingrix Completed?: No.    Education has been provided regarding the importance of this vaccine. Patient has been advised to call insurance company to determine out of pocket expense if they have not yet received this vaccine. Advised may also receive vaccine at local pharmacy or Health Dept. Verbalized acceptance and understanding.  Screening Tests Health Maintenance  Topic Date Due  . Hepatitis C Screening  Never done  . COVID-19 Vaccine (3 - Booster for Moderna series) 10/09/2020  . TETANUS/TDAP  08/04/2022  . INFLUENZA VACCINE  Completed  . HIV Screening  Completed  . HPV VACCINES  Aged Out    Health Maintenance  Health Maintenance Due  Topic Date Due  . Hepatitis C Screening  Never done  . COVID-19 Vaccine (3 - Booster for Moderna series) 10/09/2020    Due to age , Colonoscopy not due    Lung Cancer Screening: (Low Dose CT Chest recommended if Age 4-80 years, 30 pack-year currently smoking OR have quit w/in 15years.) does not qualify.   Lung Cancer Screening Referral: N/A  Additional Screening:  Hepatitis C Screening: does not  qualify  Vision Screening: Recommended annual ophthalmology exams for early detection of glaucoma and other disorders of the eye. Is the patient up to date with their annual eye exam?  {YES/NO:21197} Who is the provider or what is the name of the office in which the patient attends annual eye exams? *** If pt is not established with a provider, would they like to be referred to a provider to establish care? {YES/NO:21197}.   Dental Screening: Recommended annual dental exams for proper oral hygiene  Community Resource Referral / Chronic Care Management: CRR required this visit?  No   CCM required this visit?  No      Plan:     I have personally reviewed and noted the following in the patient's chart:   . Medical and social history . Use of alcohol, tobacco or illicit drugs  . Current medications and supplements . Functional ability and status . Nutritional status . Physical activity . Advanced directives . List of other physicians . Hospitalizations, surgeries, and ER visits in previous 12 months . Vitals . Screenings to include cognitive, depression, and falls . Referrals and appointments  In addition, I have reviewed and discussed with patient certain preventive protocols, quality metrics, and best practice recommendations. A written personalized care plan for preventive services as well as general preventive health recommendations were provided to patient.     March Rummage, LPN   08/05/5175   Nurse Notes:None

## 2021-03-03 ENCOUNTER — Ambulatory Visit: Payer: Medicare Other

## 2021-03-03 DIAGNOSIS — Z0000 Encounter for general adult medical examination without abnormal findings: Secondary | ICD-10-CM

## 2021-04-01 ENCOUNTER — Ambulatory Visit: Payer: Medicare Other

## 2021-04-01 NOTE — Progress Notes (Unsigned)
Subjective:   Brian Rowe is a 45 y.o. male who presents for an Initial Medicare Annual Wellness Visit.  Review of Systems    n/a       Objective:    There were no vitals filed for this visit. There is no height or weight on file to calculate BMI.  Advanced Directives 04/15/2020 04/15/2020 04/04/2020 01/26/2018 09/04/2017 07/07/2017 05/08/2017  Does Patient Have a Medical Advance Directive? No No No No Yes No No  Would patient like information on creating a medical advance directive? No - Patient declined No - Patient declined No - Patient declined - - - -  Some encounter information is confidential and restricted. Go to Review Flowsheets activity to see all data.    Current Medications (verified) Outpatient Encounter Medications as of 04/01/2021  Medication Sig  . ALPRAZolam (XANAX) 2 MG tablet Take 1 tablet (2 mg total) by mouth 2 (two) times daily. For anxiety  . aspirin EC 81 MG tablet Take 81 mg by mouth daily. Swallow whole.  . levothyroxine (SYNTHROID) 112 MCG tablet TAKE ONE TABLET BY MOUTH DAILY  . meloxicam (MOBIC) 15 MG tablet Take 15 mg by mouth daily.  . Multiple Vitamins-Minerals (MULTIVITAMIN ADULTS PO) Take by mouth.  Marland Kitchen OVER THE COUNTER MEDICATION Beets vitamin powder daily  . OVER THE COUNTER MEDICATION Hemp protein/fiber powder as needed  . OVER THE COUNTER MEDICATION CBD - Kratom pill-  One pill every 3 days  . sertraline (ZOLOFT) 100 MG tablet Take 100 mg by mouth daily.  . tadalafil (CIALIS) 10 MG tablet Take 1 tablet (10 mg total) by mouth daily as needed for erectile dysfunction.  . traZODone (DESYREL) 50 MG tablet Take 1 tablet (50 mg total) by mouth at bedtime as needed for sleep.   No facility-administered encounter medications on file as of 04/01/2021.    Allergies (verified) Tramadol   History: Past Medical History:  Diagnosis Date  . ANXIETY 07/19/2007  . Arthritis   . DEPRESSION 07/19/2007  . HEMORRHOIDS, INTERNAL 12/01/2008  . HYPERLIPIDEMIA  07/19/2007  . HYPERTENSION 07/19/2007  . Sleep apnea    Past Surgical History:  Procedure Laterality Date  . INGUINAL HERNIA REPAIR Right   . left ankle Left   . NASAL SEPTUM SURGERY    . TONSILLECTOMY    . UVULOPALATOPHARYNGOPLASTY (UPPP)/TONSILLECTOMY/SEPTOPLASTY     Family History  Problem Relation Age of Onset  . Hypertension Father   . Anxiety disorder Father   . Obesity Mother   . Sleep apnea Mother   . Rheum arthritis Sister   . Alzheimer's disease Maternal Grandmother   . Stroke Maternal Grandfather   . Alzheimer's disease Paternal Grandmother   . Pancreatic cancer Paternal Grandfather   . Prostate cancer Maternal Uncle   . Colon polyps Neg Hx   . Esophageal cancer Neg Hx   . Rectal cancer Neg Hx    Social History   Socioeconomic History  . Marital status: Married    Spouse name: Not on file  . Number of children: 2  . Years of education: Not on file  . Highest education level: Not on file  Occupational History  . Occupation: disabled  Tobacco Use  . Smoking status: Former Smoker    Quit date: 12/06/2011    Years since quitting: 9.3  . Smokeless tobacco: Never Used  Vaping Use  . Vaping Use: Never used  Substance and Sexual Activity  . Alcohol use: Not Currently    Alcohol/week: 1.0 standard  drink    Types: 1 Cans of beer per week  . Drug use: No  . Sexual activity: Yes  Other Topics Concern  . Not on file  Social History Narrative  . Not on file   Social Determinants of Health   Financial Resource Strain: Not on file  Food Insecurity: Not on file  Transportation Needs: Not on file  Physical Activity: Not on file  Stress: Not on file  Social Connections: Not on file    Tobacco Counseling Counseling given: Not Answered   Clinical Intake:                 Diabetic?***         Activities of Daily Living In your present state of health, do you have any difficulty performing the following activities: 04/15/2020  Hearing? N   Vision? N  Difficulty concentrating or making decisions? Y  Walking or climbing stairs? Y  Comment Hx of surgery to left ankle  Dressing or bathing? N  Some encounter information is confidential and restricted. Go to Review Flowsheets activity to see all data.  Some recent data might be hidden    Patient Care Team: Wynn Banker, MD as PCP - General (Family Medicine)  Indicate any recent Medical Services you may have received from other than [facility] providers in the past year (date may be approximate).     Assessment:   This is a routine wellness examination for Brian Rowe.  Hearing/Vision screen No exam data present  Dietary issues and exercise activities discussed:    Goals   None    Depression Screen PHQ 2/9 Scores 04/09/2018 02/23/2018 12/27/2017 10/31/2017 09/04/2017 04/04/2017 08/29/2016  PHQ - 2 Score 0 2 2 2  0 6 0  PHQ- 9 Score - - - - - - -    Fall Risk Fall Risk  04/09/2018 02/23/2018 12/27/2017 10/31/2017 09/04/2017  Falls in the past year? No Yes Yes No No  Comment - 11/2017 - - -  Number falls in past yr: - 1 1 - -  Injury with Fall? - - Yes - -  Risk for fall due to : - Impaired balance/gait Impaired balance/gait - -  Follow up - - - - -    FALL RISK PREVENTION PERTAINING TO THE HOME:  Any stairs in or around the home? {YES/NO:21197} If so, are there any without handrails? {YES/NO:21197} Home free of loose throw rugs in walkways, pet beds, electrical cords, etc? {YES/NO:21197} Adequate lighting in your home to reduce risk of falls? {YES/NO:21197}  ASSISTIVE DEVICES UTILIZED TO PREVENT FALLS:  Life alert? {YES/NO:21197} Use of a cane, walker or w/c? {YES/NO:21197} Grab bars in the bathroom? {YES/NO:21197} Shower chair or bench in shower? {YES/NO:21197} Elevated toilet seat or a handicapped toilet? {YES/NO:21197}  TIMED UP AND GO:  Was the test performed? {YES/NO:21197}.  Length of time to ambulate 10 feet: *** sec.   {Appearance of  12/2017  Cognitive Function:        Immunizations Immunization History  Administered Date(s) Administered  . Influenza Inj Mdck Quad With Preservative 10/19/2017  . Influenza Split 09/02/2013  . Influenza,inj,Quad PF,6+ Mos 08/29/2016  . Influenza-Unspecified 08/06/2014, 12/19/2017, 09/04/2018  . Moderna Sars-Covid-2 Vaccination 03/10/2020, 04/08/2020  . Td 12/05/2002  . Tdap 08/04/2012    {TDAP status:2101805}  {Flu Vaccine status:2101806}  {Pneumococcal vaccine status:2101807}  {Covid-19 vaccine status:2101808}  Qualifies for Shingles Vaccine? {YES/NO:21197}  Zostavax completed {YES/NO:21197}  {Shingrix Completed?:2101804}  Screening Tests Health Maintenance  Topic Date Due  .  Hepatitis C Screening  Never done  . COVID-19 Vaccine (3 - Booster for Moderna series) 10/09/2020  . INFLUENZA VACCINE  07/05/2021  . TETANUS/TDAP  08/04/2022  . HIV Screening  Completed  . HPV VACCINES  Aged Out    Health Maintenance  Health Maintenance Due  Topic Date Due  . Hepatitis C Screening  Never done  . COVID-19 Vaccine (3 - Booster for Moderna series) 10/09/2020    {Colorectal cancer screening:2101809}  Lung Cancer Screening: (Low Dose CT Chest recommended if Age 35-80 years, 30 pack-year currently smoking OR have quit w/in 15years.) {DOES NOT does:27190::"does not"} qualify.   Lung Cancer Screening Referral: ***  Additional Screening:  Hepatitis C Screening: {DOES NOT does:27190::"does not"} qualify; Completed ***  Vision Screening: Recommended annual ophthalmology exams for early detection of glaucoma and other disorders of the eye. Is the patient up to date with their annual eye exam?  {YES/NO:21197} Who is the provider or what is the name of the office in which the patient attends annual eye exams? *** If pt is not established with a provider, would they like to be referred to a provider to establish care? {YES/NO:21197}.   Dental Screening: Recommended  annual dental exams for proper oral hygiene  Community Resource Referral / Chronic Care Management: CRR required this visit?  {YES/NO:21197}  CCM required this visit?  {YES/NO:21197}     Plan:     I have personally reviewed and noted the following in the patient's chart:   . Medical and social history . Use of alcohol, tobacco or illicit drugs  . Current medications and supplements . Functional ability and status . Nutritional status . Physical activity . Advanced directives . List of other physicians . Hospitalizations, surgeries, and ER visits in previous 12 months . Vitals . Screenings to include cognitive, depression, and falls . Referrals and appointments  In addition, I have reviewed and discussed with patient certain preventive protocols, quality metrics, and best practice recommendations. A written personalized care plan for preventive services as well as general preventive health recommendations were provided to patient.     March Rummage, LPN   8/52/7782   Nurse Notes: ***

## 2021-04-16 ENCOUNTER — Other Ambulatory Visit: Payer: Self-pay | Admitting: Family Medicine

## 2021-04-20 DIAGNOSIS — M25572 Pain in left ankle and joints of left foot: Secondary | ICD-10-CM | POA: Diagnosis not present

## 2021-04-20 DIAGNOSIS — M47816 Spondylosis without myelopathy or radiculopathy, lumbar region: Secondary | ICD-10-CM | POA: Diagnosis not present

## 2021-04-21 ENCOUNTER — Ambulatory Visit (INDEPENDENT_AMBULATORY_CARE_PROVIDER_SITE_OTHER): Payer: Medicare Other

## 2021-04-21 ENCOUNTER — Other Ambulatory Visit: Payer: Self-pay

## 2021-04-21 VITALS — BP 142/72 | HR 81 | Temp 97.6°F | Ht 75.0 in | Wt 337.0 lb

## 2021-04-21 DIAGNOSIS — Z0000 Encounter for general adult medical examination without abnormal findings: Secondary | ICD-10-CM | POA: Diagnosis not present

## 2021-04-21 NOTE — Patient Instructions (Signed)
[name] , Thank you for taking time to come for your Medicare Wellness Visit. I appreciate your ongoing commitment to your health goals. Please review the following plan we discussed and let me know if I can assist you in the future.   Screening recommendations/referrals: Colonoscopy: not yet required  Recommended yearly ophthalmology/optometry visit for glaucoma screening and checkup Recommended yearly dental visit for hygiene and checkup  Vaccinations: Influenza vaccine: current due in Fall 2022 Pneumococcal vaccine: not of age  Tdap vaccine: current  Shingles vaccine: not of age   Advanced directives: will provide copies   Conditions/risks identified: none   Next appointment: none   Preventive Care 10-64 Years, Male Preventive care refers to lifestyle choices and visits with your health care provider that can promote health and wellness. What does preventive care include?  A yearly physical exam. This is also called an annual well check.  Dental exams once or twice a year.  Routine eye exams. Ask your health care provider how often you should have your eyes checked.  Personal lifestyle choices, including:  Daily care of your teeth and gums.  Regular physical activity.  Eating a healthy diet.  Avoiding tobacco and drug use.  Limiting alcohol use.  Practicing safe sex.  Taking low-dose aspirin every day starting at age 23. What happens during an annual well check? The services and screenings done by your health care provider during your annual well check will depend on your age, overall health, lifestyle risk factors, and family history of disease. Counseling  Your health care provider may ask you questions about your:  Alcohol use.  Tobacco use.  Drug use.  Emotional well-being.  Home and relationship well-being.  Sexual activity.  Eating habits.  Work and work Astronomer. Screening  You may have the following tests or measurements:  Height,  weight, and BMI.  Blood pressure.  Lipid and cholesterol levels. These may be checked every 5 years, or more frequently if you are over 51 years old.  Skin check.  Lung cancer screening. You may have this screening every year starting at age 64 if you have a 30-pack-year history of smoking and currently smoke or have quit within the past 15 years.  Fecal occult blood test (FOBT) of the stool. You may have this test every year starting at age 74.  Flexible sigmoidoscopy or colonoscopy. You may have a sigmoidoscopy every 5 years or a colonoscopy every 10 years starting at age 75.  Prostate cancer screening. Recommendations will vary depending on your family history and other risks.  Hepatitis C blood test.  Hepatitis B blood test.  Sexually transmitted disease (STD) testing.  Diabetes screening. This is done by checking your blood sugar (glucose) after you have not eaten for a while (fasting). You may have this done every 1-3 years. Discuss your test results, treatment options, and if necessary, the need for more tests with your health care provider. Vaccines  Your health care provider may recommend certain vaccines, such as:  Influenza vaccine. This is recommended every year.  Tetanus, diphtheria, and acellular pertussis (Tdap, Td) vaccine. You may need a Td booster every 10 years.  Zoster vaccine. You may need this after age 69.  Pneumococcal 13-valent conjugate (PCV13) vaccine. You may need this if you have certain conditions and have not been vaccinated.  Pneumococcal polysaccharide (PPSV23) vaccine. You may need one or two doses if you smoke cigarettes or if you have certain conditions. Talk to your health care provider about which  screenings and vaccines you need and how often you need them. This information is not intended to replace advice given to you by your health care provider. Make sure you discuss any questions you have with your health care provider. Document  Released: 12/18/2015 Document Revised: 08/10/2016 Document Reviewed: 09/22/2015 Elsevier Interactive Patient Education  2017 [employer].  Fall Prevention in the Home Falls can cause injuries. They can happen to people of all ages. There are many things you can do to make your home safe and to help prevent falls. What can I do on the outside of my home?  Regularly fix the edges of walkways and driveways and fix any cracks.  Remove anything that might make you trip as you walk through a door, such as a raised step or threshold.  Trim any bushes or trees on the path to your home.  Use bright outdoor lighting.  Clear any walking paths of anything that might make someone trip, such as rocks or tools.  Regularly check to see if handrails are loose or broken. Make sure that both sides of any steps have handrails.  Any raised decks and porches should have guardrails on the edges.  Have any leaves, snow, or ice cleared regularly.  Use sand or salt on walking paths during winter.  Clean up any spills in your garage right away. This includes oil or grease spills. What can I do in the bathroom?  Use night lights.  Install grab bars by the toilet and in the tub and shower. Do not use towel bars as grab bars.  Use non-skid mats or decals in the tub or shower.  If you need to sit down in the shower, use a plastic, non-slip stool.  Keep the floor dry. Clean up any water that spills on the floor as soon as it happens.  Remove soap buildup in the tub or shower regularly.  Attach bath mats securely with double-sided non-slip rug tape.  Do not have throw rugs and other things on the floor that can make you trip. What can I do in the bedroom?  Use night lights.  Make sure that you have a light by your bed that is easy to reach.  Do not use any sheets or blankets that are too big for your bed. They should not hang down onto the floor.  Have a firm chair that has side arms. You can use  this for support while you get dressed.  Do not have throw rugs and other things on the floor that can make you trip. What can I do in the kitchen?  Clean up any spills right away.  Avoid walking on wet floors.  Keep items that you use a lot in easy-to-reach places.  If you need to reach something above you, use a strong step stool that has a grab bar.  Keep electrical cords out of the way.  Do not use floor polish or wax that makes floors slippery. If you must use wax, use non-skid floor wax.  Do not have throw rugs and other things on the floor that can make you trip. What can I do with my stairs?  Do not leave any items on the stairs.  Make sure that there are handrails on both sides of the stairs and use them. Fix handrails that are broken or loose. Make sure that handrails are as long as the stairways.  Check any carpeting to make sure that it is firmly attached to the stairs.  Fix any carpet that is loose or worn.  Avoid having throw rugs at the top or bottom of the stairs. If you do have throw rugs, attach them to the floor with carpet tape.  Make sure that you have a light switch at the top of the stairs and the bottom of the stairs. If you do not have them, ask someone to add them for you. What else can I do to help prevent falls?  Wear shoes that:  Do not have high heels.  Have rubber bottoms.  Are comfortable and fit you well.  Are closed at the toe. Do not wear sandals.  If you use a stepladder:  Make sure that it is fully opened. Do not climb a closed stepladder.  Make sure that both sides of the stepladder are locked into place.  Ask someone to hold it for you, if possible.  Clearly mark and make sure that you can see:  Any grab bars or handrails.  First and last steps.  Where the edge of each step is.  Use tools that help you move around (mobility aids) if they are needed. These include:  Canes.  Walkers.  Scooters.  Crutches.  Turn on  the lights when you go into a dark area. Replace any light bulbs as soon as they burn out.  Set up your furniture so you have a clear path. Avoid moving your furniture around.  If any of your floors are uneven, fix them.  If there are any pets around you, be aware of where they are.  Review your medicines with your doctor. Some medicines can make you feel dizzy. This can increase your chance of falling. Ask your doctor what other things that you can do to help prevent falls. This information is not intended to replace advice given to you by your health care provider. Make sure you discuss any questions you have with your health care provider. Document Released: 09/17/2009 Document Revised: 04/28/2016 Document Reviewed: 12/26/2014 Elsevier Interactive Patient Education  2017 [employer].

## 2021-04-21 NOTE — Progress Notes (Signed)
Subjective:   Brian Rowe is a 45 y.o. male who presents for an Initial Medicare Annual Wellness Visit.  Review of Systems    n/a Cardiac Risk Factors include: male gender     Objective:    Today's Vitals   04/21/21 1235 04/21/21 1236  BP: (!) 142/72   Pulse: 81   Temp: 97.6 F (36.4 C)   SpO2: 94%   Weight: (!) 337 lb (152.9 kg)   Height: 6\' 3"  (1.905 m)   PainSc:  4    Body mass index is 42.12 kg/m.  Advanced Directives 04/21/2021 04/15/2020 04/15/2020 04/04/2020 01/26/2018 09/04/2017 07/07/2017  Does Patient Have a Medical Advance Directive? No No No No No Yes No  Would patient like information on creating a medical advance directive? No - Patient declined No - Patient declined No - Patient declined No - Patient declined - - -  Some encounter information is confidential and restricted. Go to Review Flowsheets activity to see all data.    Current Medications (verified) Outpatient Encounter Medications as of 04/21/2021  Medication Sig  . ALPRAZolam (XANAX) 2 MG tablet Take 1 tablet (2 mg total) by mouth 2 (two) times daily. For anxiety  . aspirin EC 81 MG tablet Take 81 mg by mouth daily. Swallow whole.  . levothyroxine (SYNTHROID) 112 MCG tablet TAKE ONE TABLET BY MOUTH DAILY *NEED APPOINTMENT, PER MD*  . Multiple Vitamins-Minerals (MULTIVITAMIN ADULTS PO) Take by mouth.  . sertraline (ZOLOFT) 100 MG tablet Take 100 mg by mouth daily.  . traZODone (DESYREL) 50 MG tablet Take 1 tablet (50 mg total) by mouth at bedtime as needed for sleep.  . meloxicam (MOBIC) 15 MG tablet Take 15 mg by mouth daily. (Patient not taking: Reported on 04/21/2021)  . OVER THE COUNTER MEDICATION Beets vitamin powder daily (Patient not taking: Reported on 04/21/2021)  . OVER THE COUNTER MEDICATION Hemp protein/fiber powder as needed (Patient not taking: Reported on 04/21/2021)  . OVER THE COUNTER MEDICATION CBD - Kratom pill-  One pill every 3 days (Patient not taking: Reported on 04/21/2021)  .  tadalafil (CIALIS) 10 MG tablet Take 1 tablet (10 mg total) by mouth daily as needed for erectile dysfunction. (Patient not taking: Reported on 04/21/2021)   No facility-administered encounter medications on file as of 04/21/2021.    Allergies (verified) Tramadol   History: Past Medical History:  Diagnosis Date  . ANXIETY 07/19/2007  . Arthritis   . DEPRESSION 07/19/2007  . HEMORRHOIDS, INTERNAL 12/01/2008  . HYPERLIPIDEMIA 07/19/2007  . HYPERTENSION 07/19/2007  . Sleep apnea    Past Surgical History:  Procedure Laterality Date  . INGUINAL HERNIA REPAIR Right   . left ankle Left   . NASAL SEPTUM SURGERY    . TONSILLECTOMY    . UVULOPALATOPHARYNGOPLASTY (UPPP)/TONSILLECTOMY/SEPTOPLASTY     Family History  Problem Relation Age of Onset  . Hypertension Father   . Anxiety disorder Father   . Obesity Mother   . Sleep apnea Mother   . Rheum arthritis Sister   . Alzheimer's disease Maternal Grandmother   . Stroke Maternal Grandfather   . Alzheimer's disease Paternal Grandmother   . Pancreatic cancer Paternal Grandfather   . Prostate cancer Maternal Uncle   . Colon polyps Neg Hx   . Esophageal cancer Neg Hx   . Rectal cancer Neg Hx    Social History   Socioeconomic History  . Marital status: Married    Spouse name: Not on file  . Number of children:  2  . Years of education: Not on file  . Highest education level: Not on file  Occupational History  . Occupation: disabled  Tobacco Use  . Smoking status: Former Smoker    Quit date: 12/06/2011    Years since quitting: 9.3  . Smokeless tobacco: Never Used  Vaping Use  . Vaping Use: Never used  Substance and Sexual Activity  . Alcohol use: Not Currently    Alcohol/week: 1.0 standard drink    Types: 1 Cans of beer per week  . Drug use: No  . Sexual activity: Yes  Other Topics Concern  . Not on file  Social History Narrative  . Not on file   Social Determinants of Health   Financial Resource Strain: Low Risk   .  Difficulty of Paying Living Expenses: Not hard at all  Food Insecurity: No Food Insecurity  . Worried About Programme researcher, broadcasting/film/video in the Last Year: Never true  . Ran Out of Food in the Last Year: Never true  Transportation Needs: No Transportation Needs  . Lack of Transportation (Medical): No  . Lack of Transportation (Non-Medical): No  Physical Activity: Inactive  . Days of Exercise per Week: 0 days  . Minutes of Exercise per Session: 0 min  Stress: No Stress Concern Present  . Feeling of Stress : Only a little  Social Connections: Moderately Isolated  . Frequency of Communication with Friends and Family: More than three times a week  . Frequency of Social Gatherings with Friends and Family: More than three times a week  . Attends Religious Services: 1 to 4 times per year  . Active Member of Clubs or Organizations: No  . Attends Banker Meetings: Never  . Marital Status: Separated    Tobacco Counseling Counseling given: Not Answered   Clinical Intake:  Pre-visit preparation completed: Yes  Pain : 0-10 Pain Score: 4  Pain Location: Back Pain Orientation: Lower Pain Descriptors / Indicators: Constant Pain Onset: More than a month ago Pain Frequency: Constant Pain Relieving Factors: meds pain clinic  Pain Relieving Factors: meds pain clinic  Nutritional Risks: None Diabetes: No  How often do you need to have someone help you when you read instructions, pamphlets, or other written materials from your doctor or pharmacy?: 1 - Never What is the last grade level you completed in school?: college  Diabetic?no  Interpreter Needed?: No  Information entered by :: l.Charmaine Placido,Lpn   Activities of Daily Living In your present state of health, do you have any difficulty performing the following activities: 04/21/2021  Hearing? N  Vision? N  Difficulty concentrating or making decisions? N  Walking or climbing stairs? N  Dressing or bathing? N  Doing errands,  shopping? N  Preparing Food and eating ? N  Using the Toilet? N  In the past six months, have you accidently leaked urine? N  Do you have problems with loss of bowel control? N  Managing your Medications? N  Managing your Finances? N  Housekeeping or managing your Housekeeping? N  Some recent data might be hidden    Patient Care Team: Wynn Banker, MD as PCP - General (Family Medicine)  Indicate any recent Medical Services you may have received from other than [facility] providers in the past year (date may be approximate).     Assessment:   This is a routine wellness examination for Ja[place]on.  Hearing/Vision screen  Hearing Screening   125Hz  250Hz  500Hz  1000Hz  2000Hz  3000Hz  4000Hz  6000Hz  8000Hz   Right ear:           Left ear:           Vision Screening Comments: Declines annual eye exam   Dietary issues and exercise activities discussed: Current Exercise Habits: The patient does not participate in regular exercise at present, Exercise limited by: orthopedic condition(s)  Goals Addressed            This Visit's Progress   . Exercise 3x per week (30 min per time)        Depression Screen PHQ 2/9 Scores 04/21/2021 04/21/2021 04/09/2018 02/23/2018 12/27/2017 10/31/2017 09/04/2017  PHQ - 2 Score 0 0 0 2 2 2  0  PHQ- 9 Score - - - - - - -    Fall Risk Fall Risk  04/21/2021 04/09/2018 02/23/2018 12/27/2017 10/31/2017  Falls in the past year? 0 No Yes Yes No  Comment - - 11/2017 - -  Number falls in past yr: 0 - 1 1 -  Injury with Fall? 0 - - Yes -  Risk for fall due to : - - Impaired balance/gait Impaired balance/gait -  Follow up Falls evaluation completed - - - -    FALL RISK PREVENTION PERTAINING TO THE HOME:  Any stairs in or around the home? Yes  If so, are there any without handrails? No  Home free of loose throw rugs in walkways, pet beds, electrical cords, etc? Yes  Adequate lighting in your home to reduce risk of falls? Yes   ASSISTIVE DEVICES UTILIZED TO PREVENT  FALLS:  Life alert? No  Use of a cane, walker or w/c? No  Grab bars in the bathroom? No  Shower chair or bench in shower? No  Elevated toilet seat or a handicapped toilet? No   TIMED UP AND GO:  Was the test performed? Yes .  Length of time to ambulate 10 feet: 8 sec.   Gait steady and fast without use of assistive device  Cognitive Function:        Immunizations Immunization History  Administered Date(s) Administered  . Influenza Inj Mdck Quad With Preservative 10/19/2017  . Influenza Split 09/02/2013  . Influenza,inj,Quad PF,6+ Mos 08/29/2016  . Influenza-Unspecified 08/06/2014, 12/19/2017, 09/04/2018  . Moderna Sars-Covid-2 Vaccination 03/10/2020, 04/08/2020  . Td 12/05/2002  . Tdap 08/04/2012    TDAP status: Up to date  Flu Vaccine status: Up to date  Pneumococcal vaccine status: Declined,  Education has been provided regarding the importance of this vaccine but patient still declined. Advised may receive this vaccine at local pharmacy or Health Dept. Aware to provide a copy of the vaccination record if obtained from local pharmacy or Health Dept. Verbalized acceptance and understanding.   Covid-19 vaccine status: Completed vaccines  Qualifies for Shingles Vaccine? No   Zostavax completed No   Shingrix Completed?: No.    Education has been provided regarding the importance of this vaccine. Patient has been advised to call insurance company to determine out of pocket expense if they have not yet received this vaccine. Advised may also receive vaccine at local pharmacy or Health Dept. Verbalized acceptance and understanding.  Screening Tests Health Maintenance  Topic Date Due  . Hepatitis C Screening  Never done  . COVID-19 Vaccine (3 - Booster for Moderna series) 09/08/2020  . INFLUENZA VACCINE  07/05/2021  . TETANUS/TDAP  08/04/2022  . HIV Screening  Completed  . HPV VACCINES  Aged Out    Health Maintenance  Health Maintenance Due  Topic Date Due  .  Hepatitis C Screening  Never done  . COVID-19 Vaccine (3 - Booster for Moderna series) 09/08/2020    Colorectal cancer screening: No longer required.   Lung Cancer Screening: (Low Dose CT Chest recommended if Age 70-80 years, 30 pack-year currently smoking OR have quit w/in 15years.) does not qualify.   Lung Cancer Screening Referral: n/a  Additional Screening:  Hepatitis C Screening: does not qualify  Vision Screening: Recommended annual ophthalmology exams for early detection of glaucoma and other disorders of the eye. Is the patient up to date with their annual eye exam?  Yes  Who is the provider or what is the name of the office in which the patient attends annual eye exams? [facility]  If pt is not established with a provider, would they like to be referred to a provider to establish care? No .   Dental Screening: Recommended annual dental exams for proper oral hygiene  Community Resource Referral / Chronic Care Management: CRR required this visit?  No   CCM required this visit?  No      Plan:     I have personally reviewed and noted the following in the patient's chart:   . Medical and social history . Use of alcohol, tobacco or illicit drugs  . Current medications and supplements including opioid prescriptions. Patient is currently taking opioid prescriptions. Information provided to patient regarding non-opioid alternatives. Patient advised to discuss non-opioid treatment plan with their provider. . Functional ability and status . Nutritional status . Physical activity . Advanced directives . List of other physicians . Hospitalizations, surgeries, and ER visits in previous 12 months . Vitals . Screenings to include cognitive, depression, and falls . Referrals and appointments  In addition, I have reviewed and discussed with patient certain preventive protocols, quality metrics, and best practice recommendations. A written personalized care plan for preventive  services as well as general preventive health recommendations were provided to patient.     March Rummage, LPN   05/11/3709   Nurse Notes: none

## 2021-05-13 DIAGNOSIS — M47816 Spondylosis without myelopathy or radiculopathy, lumbar region: Secondary | ICD-10-CM | POA: Diagnosis not present

## 2021-05-14 DIAGNOSIS — M19072 Primary osteoarthritis, left ankle and foot: Secondary | ICD-10-CM | POA: Diagnosis not present

## 2021-05-14 DIAGNOSIS — M25572 Pain in left ankle and joints of left foot: Secondary | ICD-10-CM | POA: Diagnosis not present

## 2021-07-14 ENCOUNTER — Other Ambulatory Visit: Payer: Self-pay | Admitting: Family Medicine

## 2021-08-24 DIAGNOSIS — Z23 Encounter for immunization: Secondary | ICD-10-CM | POA: Diagnosis not present

## 2021-08-27 DIAGNOSIS — M47896 Other spondylosis, lumbar region: Secondary | ICD-10-CM | POA: Diagnosis not present

## 2021-08-27 DIAGNOSIS — Z79899 Other long term (current) drug therapy: Secondary | ICD-10-CM | POA: Diagnosis not present

## 2021-08-27 DIAGNOSIS — Z5181 Encounter for therapeutic drug level monitoring: Secondary | ICD-10-CM | POA: Diagnosis not present

## 2021-09-02 DIAGNOSIS — M47816 Spondylosis without myelopathy or radiculopathy, lumbar region: Secondary | ICD-10-CM | POA: Diagnosis not present

## 2021-09-19 ENCOUNTER — Other Ambulatory Visit: Payer: Self-pay | Admitting: Family Medicine

## 2021-09-20 ENCOUNTER — Other Ambulatory Visit: Payer: Self-pay | Admitting: Family Medicine

## 2021-09-21 ENCOUNTER — Telehealth: Payer: Self-pay | Admitting: Family Medicine

## 2021-09-21 NOTE — Telephone Encounter (Signed)
Rx previously sent. 

## 2021-09-21 NOTE — Telephone Encounter (Signed)
Pt was denied levothyroxine due to needing an appt. Pt has an appt on sch for 10-18-2021 and pharm has sent a refill request

## 2021-10-04 ENCOUNTER — Ambulatory Visit (INDEPENDENT_AMBULATORY_CARE_PROVIDER_SITE_OTHER): Payer: Medicare Other | Admitting: Family Medicine

## 2021-10-04 ENCOUNTER — Other Ambulatory Visit: Payer: Self-pay

## 2021-10-04 VITALS — BP 132/72 | HR 97 | Temp 98.6°F | Wt 342.8 lb

## 2021-10-04 DIAGNOSIS — J029 Acute pharyngitis, unspecified: Secondary | ICD-10-CM | POA: Diagnosis not present

## 2021-10-04 LAB — POCT RAPID STREP A (OFFICE): Rapid Strep A Screen: NEGATIVE

## 2021-10-04 MED ORDER — LIDOCAINE VISCOUS HCL 2 % MT SOLN
15.0000 mL | OROMUCOSAL | 0 refills | Status: DC | PRN
Start: 2021-10-04 — End: 2021-10-25

## 2021-10-04 MED ORDER — NAPROXEN 500 MG PO TABS
500.0000 mg | ORAL_TABLET | Freq: Two times a day (BID) | ORAL | 0 refills | Status: DC
Start: 2021-10-04 — End: 2021-10-15

## 2021-10-04 NOTE — Progress Notes (Signed)
Established Patient Office Visit  Subjective:  Patient ID: Brian Rowe, male    DOB: December 28, 1975  Age: 45 y.o. MRN: [id number]  CC:  Chief Complaint  Patient presents with   Sore Throat    Started Thursday, sore throat, ear pain, congestion, patient states he feels like he swallowed broken glass.     HPI [name] presents for severe sore throat.  Started last Thursday.  He has pain with swallowing.  Very mild nasal congestion.  Occasional right earache.  No cough.  No documented fever.  Occasional chills.  No sick contacts.  At baseline, he takes Percocet 10 mg 3 times daily per orthopedics.  He has had previous uvulopalatopharyngoplasty for obstructive sleep apnea.  No skin rash.  No headaches.  He is tried multiple over-the-counter things including Mucinex and different types of cough drops without much improvement.  Pain interfering with sleep.  Past Medical History:  Diagnosis Date   ANXIETY 07/19/2007   Arthritis    DEPRESSION 07/19/2007   HEMORRHOIDS, INTERNAL 12/01/2008   HYPERLIPIDEMIA 07/19/2007   HYPERTENSION 07/19/2007   Sleep apnea     Past Surgical History:  Procedure Laterality Date   INGUINAL HERNIA REPAIR Right    left ankle Left    NASAL SEPTUM SURGERY     TONSILLECTOMY     UVULOPALATOPHARYNGOPLASTY (UPPP)/TONSILLECTOMY/SEPTOPLASTY      Family History  Problem Relation Age of Onset   Hypertension Father    Anxiety disorder Father    Obesity Mother    Sleep apnea Mother    Rheum arthritis Sister    Alzheimer's disease Maternal Grandmother    Stroke Maternal Grandfather    Alzheimer's disease Paternal Grandmother    Pancreatic cancer Paternal Grandfather    Prostate cancer Maternal Uncle    Colon polyps Neg Hx    Esophageal cancer Neg Hx    Rectal cancer Neg Hx     Social History   Socioeconomic History   Marital status: Married    Spouse name: Not on file   Number of children: 2   Years of education: Not on file   Highest education  level: Not on file  Occupational History   Occupation: disabled  Tobacco Use   Smoking status: Former    Types: Cigarettes    Quit date: 12/06/2011    Years since quitting: 9.8   Smokeless tobacco: Never  Vaping Use   Vaping Use: Never used  Substance and Sexual Activity   Alcohol use: Not Currently    Alcohol/week: 1.0 standard drink    Types: 1 Cans of beer per week   Drug use: No   Sexual activity: Yes  Other Topics Concern   Not on file  Social History Narrative   Not on file   Social Determinants of Health   Financial Resource Strain: Low Risk    Difficulty of Paying Living Expenses: Not hard at all  Food Insecurity: No Food Insecurity   Worried About Programme researcher, broadcasting/film/video in the Last Year: Never true   Barista in the Last Year: Never true  Transportation Needs: No Transportation Needs   Lack of Transportation (Medical): No   Lack of Transportation (Non-Medical): No  Physical Activity: Inactive   Days of Exercise per Week: 0 days   Minutes of Exercise per Session: 0 min  Stress: No Stress Concern Present   Feeling of Stress : Only a little  Social Connections: Moderately Isolated   Frequency of Communication  with Friends and Family: More than three times a week   Frequency of Social Gatherings with Friends and Family: More than three times a week   Attends Religious Services: 1 to 4 times per year   Active Member of [employer] or Organizations: No   Attends Banker Meetings: Never   Marital Status: Separated  Intimate Partner Violence: Not At Risk   Fear of Current or Ex-Partner: No   Emotionally Abused: No   Physically Abused: No   Sexually Abused: No    Outpatient Medications Prior to Visit  Medication Sig Dispense Refill   ALPRAZolam (XANAX) 2 MG tablet Take 1 tablet (2 mg total) by mouth 2 (two) times daily. For anxiety 8 tablet 0   aspirin EC 81 MG tablet Take 81 mg by mouth daily. Swallow whole.     levothyroxine (SYNTHROID) 112 MCG tablet  TAKE ONE TABLET BY MOUTH DAILY *LAST REFILL, PATIENT NEEDS APPOINTMENT, PER MD* 30 tablet 0   Multiple Vitamins-Minerals (MULTIVITAMIN ADULTS PO) Take by mouth.     OVER THE COUNTER MEDICATION Beets vitamin powder daily     OVER THE COUNTER MEDICATION Hemp protein/fiber powder as needed     OVER THE COUNTER MEDICATION CBD - Kratom pill-  One pill every 3 days     sertraline (ZOLOFT) 100 MG tablet Take 100 mg by mouth daily.     tadalafil (CIALIS) 10 MG tablet Take 1 tablet (10 mg total) by mouth daily as needed for erectile dysfunction. 30 tablet 5   traZODone (DESYREL) 50 MG tablet Take 1 tablet (50 mg total) by mouth at bedtime as needed for sleep. 15 tablet 0   meloxicam (MOBIC) 15 MG tablet Take 15 mg by mouth daily.     No facility-administered medications prior to visit.    Allergies  Allergen Reactions   Tramadol     ?serotonin syndrome    ROS Review of Systems  Constitutional:  Negative for chills and fever.  HENT:  Positive for sore throat. Negative for sinus pain.   Respiratory:  Negative for cough and shortness of breath.      Objective:    Physical Exam Vitals reviewed.  Constitutional:      Appearance: He is well-developed.  HENT:     Right Ear: Tympanic membrane normal.     Left Ear: Tympanic membrane normal.     Mouth/Throat:     Comments: History of previous uvulopalatopharyngoplasty.  He has some mild erythema posterior pharynx.  No visible exudate.  Posterior pharynx looks slightly dry Cardiovascular:     Rate and Rhythm: Normal rate and regular rhythm.  Pulmonary:     Effort: Pulmonary effort is normal.     Breath sounds: Normal breath sounds.  Musculoskeletal:     Cervical back: Neck supple.  Lymphadenopathy:     Cervical: No cervical adenopathy.  Neurological:     Mental Status: He is alert.    BP 132/72 (BP Location: Left Arm, Patient Position: Sitting, Cuff Size: Normal)   Pulse 97   Temp 98.6 F (37 C) (Oral)   Wt (!) 342 lb 12.8 oz (155.5  kg)   SpO2 96%   BMI 42.85 kg/m  Wt Readings from Last 3 Encounters:  10/04/21 (!) 342 lb 12.8 oz (155.5 kg)  04/21/21 (!) 337 lb (152.9 kg)  09/24/20 (!) 312 lb (141.5 kg)     Health Maintenance Due  Topic Date Due   Hepatitis C Screening  Never done   COVID-19  Vaccine (3 - Booster for Moderna series) 06/03/2020   INFLUENZA VACCINE  07/05/2021    There are no preventive care reminders to display for this patient.  Lab Results  Component Value Date   TSH 2.36 06/09/2020   Lab Results  Component Value Date   WBC 6.5 04/15/2020   HGB 17.3 (H) 04/15/2020   HCT 50.7 04/15/2020   MCV 92.2 04/15/2020   PLT 236 04/15/2020   Lab Results  Component Value Date   NA 137 06/09/2020   K 4.3 06/09/2020   CO2 27 06/09/2020   GLUCOSE 87 06/09/2020   BUN 13 06/09/2020   CREATININE 1.09 06/09/2020   BILITOT 1.5 (H) 04/15/2020   ALKPHOS 54 04/15/2020   AST 30 04/15/2020   ALT 65 (H) 04/15/2020   PROT 8.0 04/15/2020   ALBUMIN 4.5 04/15/2020   CALCIUM 9.6 06/09/2020   ANIONGAP 13 04/15/2020   GFR 73.99 06/09/2020   Lab Results  Component Value Date   CHOL 207 (H) 04/10/2020   Lab Results  Component Value Date   HDL 30.70 (L) 04/10/2020   Lab Results  Component Value Date   LDLCALC 183 (H) 01/07/2019   Lab Results  Component Value Date   TRIG 226.0 (H) 04/10/2020   Lab Results  Component Value Date   CHOLHDL 7 04/10/2020   No results found for: HGBA1C    Assessment & Plan:   Problem List Items Addressed This Visit   None Visit Diagnoses     Sore throat    -  Primary   Relevant Orders   POCT rapid strep A (Completed)     Rapid strep negative.  Suspect viral. -Recommend things like popsicles or sips on hot liquids to help -We did prescribe some viscous Xylocaine to use 10 to 15 mL every 4-6 hours as needed for severe pain -Naproxen 500 mg every 12 hours as needed for severe pain -Follow-up for any persistent or worsening symptoms  Meds ordered this  encounter  Medications   naproxen (NAPROSYN) 500 MG tablet    Sig: Take 1 tablet (500 mg total) by mouth 2 (two) times daily with a meal.    Dispense:  30 tablet    Refill:  0   lidocaine (XYLOCAINE) 2 % solution    Sig: Use as directed 15 mLs in the mouth or throat every 3 (three) hours as needed for mouth pain.    Dispense:  100 mL    Refill:  0    Follow-up: No follow-ups on file.    Evelena Peat, MD

## 2021-10-15 ENCOUNTER — Other Ambulatory Visit: Payer: Self-pay | Admitting: Family Medicine

## 2021-10-18 ENCOUNTER — Ambulatory Visit: Payer: Medicare Other | Admitting: Family Medicine

## 2021-10-22 ENCOUNTER — Other Ambulatory Visit: Payer: Self-pay | Admitting: Family Medicine

## 2021-10-25 ENCOUNTER — Ambulatory Visit (INDEPENDENT_AMBULATORY_CARE_PROVIDER_SITE_OTHER): Payer: Medicare Other | Admitting: Family Medicine

## 2021-10-25 ENCOUNTER — Encounter: Payer: Self-pay | Admitting: Family Medicine

## 2021-10-25 VITALS — BP 114/68 | HR 66 | Temp 98.2°F | Ht 75.0 in | Wt 343.6 lb

## 2021-10-25 DIAGNOSIS — Z9989 Dependence on other enabling machines and devices: Secondary | ICD-10-CM | POA: Diagnosis not present

## 2021-10-25 DIAGNOSIS — Z6841 Body Mass Index (BMI) 40.0 and over, adult: Secondary | ICD-10-CM

## 2021-10-25 DIAGNOSIS — G4733 Obstructive sleep apnea (adult) (pediatric): Secondary | ICD-10-CM

## 2021-10-25 DIAGNOSIS — Z1159 Encounter for screening for other viral diseases: Secondary | ICD-10-CM

## 2021-10-25 DIAGNOSIS — F419 Anxiety disorder, unspecified: Secondary | ICD-10-CM

## 2021-10-25 DIAGNOSIS — Z131 Encounter for screening for diabetes mellitus: Secondary | ICD-10-CM

## 2021-10-25 DIAGNOSIS — I10 Essential (primary) hypertension: Secondary | ICD-10-CM

## 2021-10-25 DIAGNOSIS — G8929 Other chronic pain: Secondary | ICD-10-CM

## 2021-10-25 DIAGNOSIS — E039 Hypothyroidism, unspecified: Secondary | ICD-10-CM | POA: Diagnosis not present

## 2021-10-25 DIAGNOSIS — Z13220 Encounter for screening for lipoid disorders: Secondary | ICD-10-CM

## 2021-10-25 DIAGNOSIS — M545 Low back pain, unspecified: Secondary | ICD-10-CM | POA: Diagnosis not present

## 2021-10-25 LAB — CBC WITH DIFFERENTIAL/PLATELET
Basophils Absolute: 0.1 10*3/uL (ref 0.0–0.1)
Basophils Relative: 0.9 % (ref 0.0–3.0)
Eosinophils Absolute: 0.3 10*3/uL (ref 0.0–0.7)
Eosinophils Relative: 3.7 % (ref 0.0–5.0)
HCT: 49.9 % (ref 39.0–52.0)
Hemoglobin: 16.9 g/dL (ref 13.0–17.0)
Lymphocytes Relative: 34.1 % (ref 12.0–46.0)
Lymphs Abs: 2.5 10*3/uL (ref 0.7–4.0)
MCHC: 33.8 g/dL (ref 30.0–36.0)
MCV: 92.4 fl (ref 78.0–100.0)
Monocytes Absolute: 0.5 10*3/uL (ref 0.1–1.0)
Monocytes Relative: 6.7 % (ref 3.0–12.0)
Neutro Abs: 4 10*3/uL (ref 1.4–7.7)
Neutrophils Relative %: 54.6 % (ref 43.0–77.0)
Platelets: 199 10*3/uL (ref 150.0–400.0)
RBC: 5.4 Mil/uL (ref 4.22–5.81)
RDW: 13.1 % (ref 11.5–15.5)
WBC: 7.4 10*3/uL (ref 4.0–10.5)

## 2021-10-25 LAB — COMPREHENSIVE METABOLIC PANEL
ALT: 47 U/L (ref 0–53)
AST: 19 U/L (ref 0–37)
Albumin: 4.7 g/dL (ref 3.5–5.2)
Alkaline Phosphatase: 50 U/L (ref 39–117)
BUN: 19 mg/dL (ref 6–23)
CO2: 29 mEq/L (ref 19–32)
Calcium: 9.7 mg/dL (ref 8.4–10.5)
Chloride: 102 mEq/L (ref 96–112)
Creatinine, Ser: 1.08 mg/dL (ref 0.40–1.50)
GFR: 83.81 mL/min (ref >60.00)
Glucose, Bld: 70 mg/dL (ref 70–99)
Potassium: 3.9 mEq/L (ref 3.5–5.1)
Sodium: 139 mEq/L (ref 135–145)
Total Bilirubin: 0.4 mg/dL (ref 0.2–1.2)
Total Protein: 7.9 g/dL (ref 6.0–8.3)

## 2021-10-25 LAB — LIPID PANEL
Cholesterol: 185 mg/dL (ref 0–200)
HDL: 39.1 mg/dL (ref >39.00)
LDL Cholesterol: 121 mg/dL — ABNORMAL HIGH (ref 0–99)
NonHDL: 146.26
Total CHOL/HDL Ratio: 5
Triglycerides: 126 mg/dL (ref 0.0–149.0)
VLDL: 25.2 mg/dL (ref 0.0–40.0)

## 2021-10-25 LAB — TSH: TSH: 4.15 u[IU]/mL (ref 0.35–5.50)

## 2021-10-25 NOTE — Progress Notes (Signed)
Brian Rowe DOB: 10-30-1976 Encounter date: 10/25/2021  This is a 45 y.o. male who presents with Chief Complaint  Patient presents with   Follow-up    History of present illness: Had severe sore throat 10/31 and saw Dr. Elease Hashimoto, but this is better. Always feels like there is something in right ear.   He fell off ladder just prior to last visit. Feels like since he had concussion he has no craving to eat. Just eats because he feels he needs to eat. Knows weight doesn't reflect this. Eats when he feels he needs to eat.   [facility]. Taking oxycodone for back. Pain seems to shift around; not as bad as it used to be.   Since last visit ruptured achilles on right. 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 Per ortho they are seeing him monthly. Sees Dr. Rolena Infante. But not surgical, so then referred to Dr. Nelva Bush. Has been told he had degenerative disc disease. Back tends to bother him more at night. Feels better when he is standing. Sitting down is harder on back. No radiation of pain. Started with PT, but didn't seem like it was helping him.   Interested in getting bloodwork today.   Takes mens one a day. Taking 1/2 BID.   Hypothyroid: taking synthroid 140mcg daily.   Still following with Dr. Toy Care - continues to prescibe zoloft 100mg . Has xanax 2mg  up to TID Prn with her. Feels that mood has been stable. He enjoys time with his daughter (52 yrs old) when she is with him. She keeps him busy.     Allergies  Allergen Reactions   Tramadol     ?serotonin syndrome   Current Meds  Medication Sig   ALPRAZolam (XANAX) 2 MG tablet Take 1 tablet (2 mg total) by mouth 2 (two) times daily. For anxiety   levothyroxine (SYNTHROID) 112 MCG tablet TAKE ONE TABLET BY MOUTH DAILY *PLEASE MAKE APPOINTMENT FOR FURTHER REFILLS*   Multiple Vitamins-Minerals (MULTIVITAMIN ADULTS PO) Take by  mouth.   naproxen (NAPROSYN) 500 MG tablet TAKE ONE TABLET BY MOUTH TWICE A DAY WITH A MEAL   OVER THE COUNTER MEDICATION CBD - Kratom pill-  One pill every 3 days   oxyCODONE-acetaminophen (PERCOCET) 10-325 MG tablet Take 1 tablet by mouth every 4 (four) hours as needed for pain.   sertraline (ZOLOFT) 100 MG tablet Take 100 mg by mouth daily.   traZODone (DESYREL) 50 MG tablet Take 1 tablet (50 mg total) by mouth at bedtime as needed for sleep.   [DISCONTINUED] lidocaine (XYLOCAINE) 2 % solution Use as directed 15 mLs in the mouth or throat every 3 (three) hours as needed for mouth pain.   [DISCONTINUED] OVER THE COUNTER MEDICATION Beets vitamin powder daily   [DISCONTINUED] OVER THE COUNTER MEDICATION Hemp protein/fiber powder as needed   [DISCONTINUED] tadalafil (CIALIS) 10 MG tablet Take 1 tablet (10 mg total) by mouth daily as needed for erectile dysfunction.    Review of Systems  Constitutional:  Negative for activity change, appetite change, chills, fatigue, fever and unexpected weight change.  HENT:  Negative for congestion, ear pain (did have pain, now just intermittent fullness), hearing loss, sinus pressure, sinus pain, sore throat and trouble swallowing.   Eyes:  Negative for pain and visual disturbance.  Respiratory:  Negative for cough, chest tightness, shortness of breath and wheezing.   Cardiovascular:  Negative for chest pain, palpitations and leg swelling.  Gastrointestinal:  Negative for abdominal distention, abdominal pain, blood in stool, constipation, diarrhea, nausea and vomiting.  Genitourinary:  Negative for decreased urine volume, difficulty urinating, dysuria, penile pain and testicular pain.  Musculoskeletal:  Positive for back pain. Negative for arthralgias and joint swelling.  Skin:  Negative for rash.  Neurological:  Negative for dizziness, weakness, numbness and headaches.  Hematological:  Negative for adenopathy. Does not bruise/bleed easily.   Psychiatric/Behavioral:  Negative for agitation, sleep disturbance and suicidal ideas. The patient is not nervous/anxious.    Objective:  BP 114/68   Pulse 66   Temp 98.2 F (36.8 C) (Oral)   Ht 6\' 3"  (1.905 m)   Wt (!) 343 lb 9.6 oz (155.9 kg)   SpO2 95%   BMI 42.95 kg/m   Weight: (!) 343 lb 9.6 oz (155.9 kg)   BP Readings from Last 3 Encounters:  10/25/21 114/68  10/04/21 132/72  04/21/21 (!) 142/72   Wt Readings from Last 3 Encounters:  10/25/21 (!) 343 lb 9.6 oz (155.9 kg)  10/04/21 (!) 342 lb 12.8 oz (155.5 kg)  04/21/21 (!) 337 lb (152.9 kg)    Physical Exam Constitutional:      General: He is not in acute distress.    Appearance: He is well-developed. He is obese.  HENT:     Right Ear: Ear canal and external ear normal.     Left Ear: Tympanic membrane, ear canal and external ear normal.     Ears:     Comments: Right TM is dull Cardiovascular:     Rate and Rhythm: Normal rate and regular rhythm.     Heart sounds: Normal heart sounds. No murmur heard.   No friction rub.  Pulmonary:     Effort: Pulmonary effort is normal. No respiratory distress.     Breath sounds: Normal breath sounds. No wheezing or rales.  Musculoskeletal:     Right lower leg: No edema.     Left lower leg: No edema.  Neurological:     Mental Status: He is alert and oriented to person, place, and time.  Psychiatric:        Behavior: Behavior normal.    Assessment/Plan  1. Essential hypertension Well controlled.  Currently not taking any medications. - CBC with Differential/Platelet; Future - Comprehensive metabolic panel; Future - Comprehensive metabolic panel - CBC with Differential/Platelet  2. OSA on CPAP He does not tolerate his CPAP.  We discussed goal of working on weight loss.  I believe this would also be helpful for him from a sleep apnea standpoint.  We will continue to address and discuss more pending bloodwork.   3. Acquired hypothyroidism He does take his synthroid  with zoloft in the morning.  Consider this with review of blood work results. - TSH; Future - TSH  4. Anxiety Managed by psychiatry.  He is given very high dose Xanax, but is not taking regularly.  He does try to limit himself with as needed medications as he does not like to be dependent or be overmedicating.  5. Chronic bilateral low back pain without sciatica Following regularly with Ortho.  He has been deemed to be not a surgical candidate, so they are managing with pain medications.  Discussed benefit of weight loss to help with back as well.  6. Encounter for hepatitis C screening test for low risk patient - Hepatitis C antibody; Future - Hepatitis C antibody  7. Lipid screening -  Lipid panel; Future - Lipid panel  8. Screening for diabetes mellitus Blood work today.  9. Class 3 severe obesity due to excess calories without serious comorbidity with body mass index (BMI) of 40.0 to 44.9 in adult [facility]) Would like to work with them to help with weight loss.  Appetite has decreased, but he is still gaining weight.  May need to work on portions/caloric intake, or consider additional medication to help with weight loss, but will determine this pending blood work results.  Encouraged regular exercise and challenging self with higher intensity moments of walking when doing his regular walks.  Back does not bother him with walking, so this is a good activity for him to continue.  Return in about 6 months (around 04/24/2022) for physical exam.     Theodis Shove, MD

## 2021-10-26 LAB — HEPATITIS C ANTIBODY
Hepatitis C Ab: NONREACTIVE
SIGNAL TO CUT-OFF: 0.08 (ref <1.00)

## 2021-11-25 ENCOUNTER — Encounter: Payer: Self-pay | Admitting: Family Medicine

## 2021-11-26 MED ORDER — MELOXICAM 15 MG PO TABS: 15.0000 mg | ORAL_TABLET | Freq: Every day | ORAL | 1 refills | Status: DC | PRN

## 2021-12-08 ENCOUNTER — Ambulatory Visit (INDEPENDENT_AMBULATORY_CARE_PROVIDER_SITE_OTHER): Payer: Medicare Other | Admitting: Family Medicine

## 2021-12-08 ENCOUNTER — Encounter: Payer: Self-pay | Admitting: Family Medicine

## 2021-12-08 VITALS — BP 140/90 | HR 97 | Temp 98.3°F | Wt 338.0 lb

## 2021-12-08 DIAGNOSIS — H60501 Unspecified acute noninfective otitis externa, right ear: Secondary | ICD-10-CM | POA: Diagnosis not present

## 2021-12-08 MED ORDER — AMOXICILLIN-POT CLAVULANATE 875-125 MG PO TABS: 1.0000 | ORAL_TABLET | Freq: Two times a day (BID) | ORAL | 0 refills | Status: DC

## 2021-12-08 MED ORDER — CIPRO HC 0.2-1 % OT SUSP: 3.0000 [drp] | Freq: Two times a day (BID) | OTIC | 0 refills | Status: DC

## 2021-12-08 NOTE — Progress Notes (Signed)
Established Patient Office Visit  Subjective:  Patient ID: Brian Rowe, male    DOB: 09-Dec-1975  Age: 46 y.o. MRN: [id number]  CC:  Chief Complaint  Patient presents with   Otalgia    Right ear draining what appears serosanguinous fluid x 2days     HPI [name] presents for 3-day history of right ear irritation and some drainage.  He had some fullness in the ear.  No hearing loss.  No fevers or chills.  He initially applied some topical 70% alcohol but this caused some irritation.  He then tried some hydrogen peroxide and this also seemed to irritate somewhat.  He has had little bit of pinkish discharge from the ear for past few days.  No recent swimming.  No recent vesicles on the skin  Past Medical History:  Diagnosis Date   AKI (acute kidney injury) ([place]) 04/05/2020   ANXIETY 07/19/2007   Arthritis    DEPRESSION 07/19/2007   HEMORRHOIDS, INTERNAL 12/01/2008   HYPERLIPIDEMIA 07/19/2007   HYPERTENSION 07/19/2007   Sleep apnea     Past Surgical History:  Procedure Laterality Date   INGUINAL HERNIA REPAIR Right    left ankle Left    NASAL SEPTUM SURGERY     TONSILLECTOMY     UVULOPALATOPHARYNGOPLASTY (UPPP)/TONSILLECTOMY/SEPTOPLASTY      Family History  Problem Relation Age of Onset   Hypertension Father    Anxiety disorder Father    Obesity Mother    Sleep apnea Mother    Rheum arthritis Sister    Alzheimer's disease Maternal Grandmother    Stroke Maternal Grandfather    Alzheimer's disease Paternal Grandmother    Pancreatic cancer Paternal Grandfather    Prostate cancer Maternal Uncle    Colon polyps Neg Hx    Esophageal cancer Neg Hx    Rectal cancer Neg Hx     Social History   Socioeconomic History   Marital status: Married    Spouse name: Not on file   Number of children: 2   Years of education: Not on file   Highest education level: Not on file  Occupational History   Occupation: disabled  Tobacco Use   Smoking status: Former    Types:  Cigarettes    Quit date: 12/06/2011    Years since quitting: 10.0   Smokeless tobacco: Never  Vaping Use   Vaping Use: Never used  Substance and Sexual Activity   Alcohol use: Not Currently    Alcohol/week: 1.0 standard drink    Types: 1 Cans of beer per week   Drug use: No   Sexual activity: Yes  Other Topics Concern   Not on file  Social History Narrative   Not on file   Social Determinants of Health   Financial Resource Strain: Low Risk    Difficulty of Paying Living Expenses: Not hard at all  Food Insecurity: No Food Insecurity   Worried About Charity fundraiser in the Last Year: Never true   Arboriculturist in the Last Year: Never true  Transportation Needs: No Transportation Needs   Lack of Transportation (Medical): No   Lack of Transportation (Non-Medical): No  Physical Activity: Inactive   Days of Exercise per Week: 0 days   Minutes of Exercise per Session: 0 min  Stress: No Stress Concern Present   Feeling of Stress : Only a little  Social Connections: Moderately Isolated   Frequency of Communication with Friends and Family: More than three times a  week   Frequency of Social Gatherings with Friends and Family: More than three times a week   Attends Religious Services: 1 to 4 times per year   Active Member of [employer] or Organizations: No   Attends Banker Meetings: Never   Marital Status: Separated  Intimate Partner Violence: Not At Risk   Fear of Current or Ex-Partner: No   Emotionally Abused: No   Physically Abused: No   Sexually Abused: No    Outpatient Medications Prior to Visit  Medication Sig Dispense Refill   ALPRAZolam (XANAX) 2 MG tablet Take 1 tablet (2 mg total) by mouth 2 (two) times daily. For anxiety 8 tablet 0   levothyroxine (SYNTHROID) 112 MCG tablet TAKE ONE TABLET BY MOUTH DAILY *PLEASE MAKE APPOINTMENT FOR FURTHER REFILLS* 90 tablet 1   meloxicam (MOBIC) 15 MG tablet Take 1 tablet (15 mg total) by mouth daily as needed for pain.  90 tablet 1   Multiple Vitamins-Minerals (MULTIVITAMIN ADULTS PO) Take by mouth.     OVER THE COUNTER MEDICATION CBD - Kratom pill-  One pill every 3 days     oxyCODONE-acetaminophen (PERCOCET) 10-325 MG tablet Take 1 tablet by mouth every 4 (four) hours as needed for pain.     sertraline (ZOLOFT) 100 MG tablet Take 100 mg by mouth daily.     traZODone (DESYREL) 50 MG tablet Take 1 tablet (50 mg total) by mouth at bedtime as needed for sleep. 15 tablet 0   No facility-administered medications prior to visit.    Allergies  Allergen Reactions   Tramadol     ?serotonin syndrome    ROS Review of Systems  Constitutional:  Negative for chills and fever.  HENT:  Positive for ear discharge. Negative for congestion, hearing loss and sore throat.      Objective:    Physical Exam Vitals reviewed.  Constitutional:      Appearance: Normal appearance.  HENT:     Left Ear: Tympanic membrane and ear canal normal.     Ears:     Comments: Right canal reveals some erythema and little bit of purulent appearing drainage.  There is mild to moderate swelling.  He also has somewhat distorted landmarks of the eardrum and moderate erythema of the eardrum.  No visible perforation.  No visible blood Cardiovascular:     Rate and Rhythm: Normal rate and regular rhythm.  Neurological:     Mental Status: He is alert.    BP 140/90 (BP Location: Left Arm, Patient Position: Sitting, Cuff Size: Large)    Pulse 97    Temp 98.3 F (36.8 C) (Oral)    Wt (!) 338 lb (153.3 kg)    SpO2 96%    BMI 42.25 kg/m  Wt Readings from Last 3 Encounters:  12/08/21 (!) 338 lb (153.3 kg)  10/25/21 (!) 343 lb 9.6 oz (155.9 kg)  10/04/21 (!) 342 lb 12.8 oz (155.5 kg)     Health Maintenance Due  Topic Date Due   COVID-19 Vaccine (4 - Booster for Moderna series) 10/20/2021    There are no preventive care reminders to display for this patient.  Lab Results  Component Value Date   TSH 4.15 10/25/2021   Lab Results   Component Value Date   WBC 7.4 10/25/2021   HGB 16.9 10/25/2021   HCT 49.9 10/25/2021   MCV 92.4 10/25/2021   PLT 199.0 10/25/2021   Lab Results  Component Value Date   NA 139 10/25/2021  K 3.9 10/25/2021   CO2 29 10/25/2021   GLUCOSE 70 10/25/2021   BUN 19 10/25/2021   CREATININE 1.08 10/25/2021   BILITOT 0.4 10/25/2021   ALKPHOS 50 10/25/2021   AST 19 10/25/2021   ALT 47 10/25/2021   PROT 7.9 10/25/2021   ALBUMIN 4.7 10/25/2021   CALCIUM 9.7 10/25/2021   ANIONGAP 13 04/15/2020   GFR 83.81 10/25/2021   Lab Results  Component Value Date   CHOL 185 10/25/2021   Lab Results  Component Value Date   HDL 39.10 10/25/2021   Lab Results  Component Value Date   LDLCALC 121 (H) 10/25/2021   Lab Results  Component Value Date   TRIG 126.0 10/25/2021   Lab Results  Component Value Date   CHOLHDL 5 10/25/2021   No results found for: HGBA1C    Assessment & Plan:   Problem List Items Addressed This Visit   None Visit Diagnoses     Acute otitis externa of right ear, unspecified type    -  Primary     Patient has evidence for acute right otitis externa.  He also may have concomitant otitis media based on findings above. -Keep water out of ears much as possible -Start Cipro HC otic 3 drops right ear canal twice daily -Augmentin 875 mg by mouth twice daily for 7 days -Follow-up with primary if symptoms not improving over the next week  Meds ordered this encounter  Medications   ciprofloxacin-hydrocortisone (CIPRO HC) OTIC suspension    Sig: Place 3 drops into the right ear 2 (two) times daily.    Dispense:  10 mL    Refill:  0   amoxicillin-clavulanate (AUGMENTIN) 875-125 MG tablet    Sig: Take 1 tablet by mouth 2 (two) times daily.    Dispense:  14 tablet    Refill:  0    Follow-up: No follow-ups on file.    Carolann Littler, MD

## 2021-12-09 ENCOUNTER — Other Ambulatory Visit: Payer: Self-pay

## 2021-12-09 MED ORDER — VALACYCLOVIR HCL 500 MG PO TABS: 500.0000 mg | ORAL_TABLET | Freq: Two times a day (BID) | ORAL | 1 refills | Status: DC

## 2021-12-09 MED ORDER — NEOMYCIN-POLYMYXIN-HC 3.5-10000-1 OT SOLN: 4.0000 [drp] | Freq: Four times a day (QID) | OTIC | 0 refills | Status: DC

## 2022-03-19 ENCOUNTER — Other Ambulatory Visit: Payer: Self-pay | Admitting: Family Medicine

## 2022-04-15 ENCOUNTER — Telehealth: Payer: Self-pay | Admitting: Family Medicine

## 2022-04-15 NOTE — Telephone Encounter (Signed)
Spoke to patient to schedule Medicare Annual Wellness Visit (AWV) either virtually or in office.  ?He stated he had a lot going on and would like a call back beginning of june ? ? ?Last AWV ;04/21/21 ? please schedule at anytime with [facility] Nurse Health Advisor 1 or 2 ? ? ? ?

## 2022-04-19 ENCOUNTER — Other Ambulatory Visit: Payer: Self-pay | Admitting: Family Medicine

## 2022-05-06 ENCOUNTER — Telehealth: Payer: Self-pay | Admitting: Family Medicine

## 2022-05-06 NOTE — Telephone Encounter (Signed)
Left message for patient to call back and schedule Medicare Annual Wellness Visit (AWV) either virtually or in office. Left  my Zachery Conch number [phone]   Last Awv 04/21/21 ; please schedule at anytime with [facility] Nurse Health Advisor 1 or 2

## 2022-06-02 ENCOUNTER — Telehealth: Payer: Self-pay | Admitting: Family Medicine

## 2022-06-02 NOTE — Telephone Encounter (Signed)
Left message for patient to call back and schedule Medicare Annual Wellness Visit (AWV) either virtually or in office. Left  my jabber number [phone]   Last AWV 04/21/21 ; please schedule at anytime with [facility] Nurse Health Advisor 1 or 2     

## 2022-07-07 ENCOUNTER — Telehealth: Payer: Self-pay | Admitting: Family Medicine

## 2022-07-07 ENCOUNTER — Ambulatory Visit: Payer: Medicare Other | Admitting: Family Medicine

## 2022-07-07 ENCOUNTER — Ambulatory Visit (INDEPENDENT_AMBULATORY_CARE_PROVIDER_SITE_OTHER): Payer: Medicare Other | Admitting: Family Medicine

## 2022-07-07 ENCOUNTER — Encounter: Payer: Self-pay | Admitting: Family Medicine

## 2022-07-07 DIAGNOSIS — H65191 Other acute nonsuppurative otitis media, right ear: Secondary | ICD-10-CM | POA: Insufficient documentation

## 2022-07-07 MED ORDER — AMOXICILLIN-POT CLAVULANATE 875-125 MG PO TABS: 1.0000 | ORAL_TABLET | Freq: Two times a day (BID) | ORAL | 0 refills | Status: AC

## 2022-07-07 NOTE — Telephone Encounter (Signed)
Spoke with the patient and informed him a visit is needed for evaluation.  Appt scheduled today with Dr Salomon Fick at 4pm.

## 2022-07-07 NOTE — Assessment & Plan Note (Signed)
Infection clearly visible on exam. Pt states he did well with the augmentin that was prescribed for him in January. Will send a new script to the pharmacy.

## 2022-07-07 NOTE — Progress Notes (Signed)
   Established Patient Office Visit  Subjective   Patient ID: Brian Rowe, male    DOB: 11-30-1976  Age: 46 y.o. MRN: [id number]  Chief Complaint  Patient presents with   Ear Pain    Patient complains of right ear pain and swelling x1 day    Ear Fullness  There is pain in the right ear. This is a new problem. The current episode started yesterday. The problem occurs constantly. The problem has been unchanged. There has been no fever. The pain is at a severity of 5/10. The pain is mild. Associated symptoms include neck pain and rhinorrhea. Pertinent negatives include no coughing, ear discharge or sore throat. He has tried nothing for the symptoms. There is no history of hearing loss or a tympanostomy tube.      Review of Systems  HENT:  Positive for rhinorrhea. Negative for ear discharge and sore throat.   Respiratory:  Negative for cough.   Musculoskeletal:  Positive for neck pain.  All other systems reviewed and are negative.     Objective:     BP 100/78 (BP Location: Left Arm, Patient Position: Sitting, Cuff Size: Large)   Pulse (!) 104   Temp 98.3 F (36.8 C) (Oral)   Ht 6\' 3"  (1.905 m)   Wt (!) 325 lb 3.2 oz (147.5 kg)   SpO2 98%   BMI 40.65 kg/m    Physical Exam HENT:     Right Ear: External ear normal. A middle ear effusion is present. Tympanic membrane is erythematous and bulging.     Left Ear: Tympanic membrane, ear canal and external ear normal.     Mouth/Throat:     Mouth: Mucous membranes are moist.     Pharynx: Oropharynx is clear.      No results found for any visits on 07/07/22.    The 10-year ASCVD risk score (Arnett DK, et al., 2019) is: 1.4%    Assessment & Plan:   Problem List Items Addressed This Visit       Nervous and Auditory   Acute otitis media with effusion of right ear   Relevant Medications   amoxicillin-clavulanate (AUGMENTIN) 875-125 MG tablet    No follow-ups on file.    2020, MD

## 2022-07-07 NOTE — Patient Instructions (Signed)
You might try an over the counter antihistamine such as Claritin, Zyrtec or Allegra for any chornic sinus issues/ congestion or drainage.

## 2022-07-07 NOTE — Telephone Encounter (Signed)
Pt saw dr Caryl Never in January and was prescribed ear drops. Condition has returned and patient is requesting those same ear drops. TOC scheduled for 08/04/22   [facility] [id number] [place], [place] - [address] Phone:  [phone]       Offered OV, declilned

## 2022-07-08 ENCOUNTER — Encounter: Payer: Self-pay | Admitting: Family Medicine

## 2022-07-08 NOTE — Telephone Encounter (Signed)
Pt calling back to check on the progress of this refill, states he hopes to know have to not have to go thru the weekend with the ear pain.

## 2022-07-12 ENCOUNTER — Telehealth: Payer: Self-pay | Admitting: Family Medicine

## 2022-07-12 NOTE — Telephone Encounter (Signed)
Is he still having the ear pain or is it better?

## 2022-07-12 NOTE — Telephone Encounter (Signed)
Left message for patient to call back and schedule Medicare Annual Wellness Visit (AWV) either virtually or in office. Left  my jabber number [phone]   Last AWV 04/21/21 ; please schedule at anytime with [facility] Nurse Health Advisor 1 or 2     

## 2022-07-13 NOTE — Telephone Encounter (Signed)
Ok thanks! He really should finish the antibiotics first before we try any other treatment.

## 2022-07-13 NOTE — Telephone Encounter (Signed)
Access Nurse Call 07/12/22 at 1545  Caller states they were prescribed antibiotics and is currently experiencing blockage in ear currently not in pain. Caller states they were looking to get prescribed something to clear ear blockage. Caller states he was recently prescribed an antibiotic for an ear infection. States he's not currently having any drainage or pain. Has been taking the abx since friday and has 1 day abx.   07/12/2022 3:55:35 PM Home Care Smith Mince, RN, [facility] Advice Given Per Guideline HOME CARE: * You should be able to treat this at home. * Upper respiratory infections (colds) and nasal allergies (hay fever) can block the eustachian tube. * Try chewing gum. * If chewing or swallowing doesn't help after 1 or 2 hours, you can try using an overthe-counter (OTC) nasal decongestant drops. You can use the nose drops twice a day. * Oxymetazoline Nasal Drops (e.g., Afrin): Available OTC. Clean out the nose before using. Spray each nostril once, wait one minute for absorption, and then spray a second time. * Do not use these medicines if you have high blood pressure, heart disease, prostate problems, or an overactive thyroid. * CETIRIZINE (REACTINE, ZYRTEC): The adult dose is 10 mg. You take it once a day. Cetirizine is available in the [place] as Zyrtec and in [place] as Reactine. * LORATADINE (ALAVERT, CLARITIN): The adult dose is 10 mg. You take it once a day. Loratadine is available in the [place] as Programmer, systems; it is available in [place] as Claritin. CALL BACK IF: * Ear congestion lasts over 48 hours * Ear pain or fever occurs * You become worse CARE ADVICE given per Ear - Congestion (Adult) guideline. * Diphenhydramine (Benadryl) is a FIRST GENERATION ANTIHISTAMINE medicine. It can make you more sleepy than the newer second generation antihistamine medicines. The adult dose of Benadryl is 25 to 50 mg by mouth. You can take it up to 4 times a day. * SECOND  GENERATION ANTIHISTAMINES such as cetirizine, fexofenadine, and loratadine, have fewer side effects than first generation antihistamines. They tend to make you less sleepy.

## 2022-07-18 ENCOUNTER — Other Ambulatory Visit: Payer: Self-pay | Admitting: *Deleted

## 2022-07-18 ENCOUNTER — Other Ambulatory Visit: Payer: Self-pay | Admitting: Family Medicine

## 2022-07-18 DIAGNOSIS — E039 Hypothyroidism, unspecified: Secondary | ICD-10-CM

## 2022-07-18 MED ORDER — LEVOTHYROXINE SODIUM 112 MCG PO TABS: 112.0000 ug | ORAL_TABLET | Freq: Every day | ORAL | 0 refills | Status: DC

## 2022-07-21 ENCOUNTER — Ambulatory Visit (INDEPENDENT_AMBULATORY_CARE_PROVIDER_SITE_OTHER): Payer: Medicare Other

## 2022-07-21 VITALS — BP 122/64 | HR 65 | Temp 98.7°F | Ht 75.0 in | Wt 317.7 lb

## 2022-07-21 DIAGNOSIS — Z0000 Encounter for general adult medical examination without abnormal findings: Secondary | ICD-10-CM | POA: Diagnosis not present

## 2022-07-21 NOTE — Patient Instructions (Addendum)
Brian Rowe , Thank you for taking time to come for your Medicare Wellness Visit. I appreciate your ongoing commitment to your health goals. Please review the following plan we discussed and let me know if I can assist you in the future.   These are the goals we discussed:  Goals       Exercise 3x per week (30 min per time)      Weight (lb) < 200 lb (90.7 kg) (pt-stated)      I want to Increase my activity.        This is a list of the screening recommended for you and due dates:  Health Maintenance  Topic Date Due   Flu Shot  07/05/2022   COVID-19 Vaccine (4 - Moderna risk series) 07/23/2022*   Tetanus Vaccine  08/04/2022   Hepatitis C Screening: USPSTF Recommendation to screen - Ages 18-79 yo.  Completed   HIV Screening  Completed   HPV Vaccine  Aged Out  *Topic was postponed. The date shown is not the original due date.    Opioid Pain Medicine Management Opioids are powerful medicines that are used to treat moderate to severe pain. When used for short periods of time, they can help you to: Sleep better. Do better in physical or occupational therapy. Feel better in the first few days after an injury. Recover from surgery. Opioids should be taken with the supervision of a trained health care provider. They should be taken for the shortest period of time possible. This is because opioids can be addictive, and the longer you take opioids, the greater your risk of addiction. This addiction can also be called opioid use disorder. What are the risks? Using opioid pain medicines for longer than 3 days increases your risk of side effects. Side effects include: Constipation. Nausea and vomiting. Breathing difficulties (respiratory depression). Drowsiness. Confusion. Opioid use disorder. Itching. Taking opioid pain medicine for a long period of time can affect your ability to do daily tasks. It also puts you at risk for: Motor vehicle crashes. Depression. Suicide. Heart  attack. Overdose, which can be life-threatening. What is a pain treatment plan? A pain treatment plan is an agreement between you and your health care provider. Pain is unique to each person, and treatments vary depending on your condition. To manage your pain, you and your health care provider need to work together. To help you do this: Discuss the goals of your treatment, including how much pain you might expect to have and how you will manage the pain. Review the risks and benefits of taking opioid medicines. Remember that a good treatment plan uses more than one approach and minimizes the chance of side effects. Be honest about the amount of medicines you take and about any drug or alcohol use. Get pain medicine prescriptions from only one health care provider. Pain can be managed with many types of alternative treatments. Ask your health care provider to refer you to one or more specialists who can help you manage pain through: Physical or occupational therapy. Counseling (cognitive behavioral therapy). Good nutrition. Biofeedback. Massage. Meditation. Non-opioid medicine. Following a gentle exercise program. How to use opioid pain medicine Taking medicine Take your pain medicine exactly as told by your health care provider. Take it only when you need it. If your pain gets less severe, you may take less than your prescribed dose if your health care provider approves. If you are not having pain, do nottake pain medicine unless your health care provider  tells you to take it. If your pain is severe, do nottry to treat it yourself by taking more pills than instructed on your prescription. Contact your health care provider for help. Write down the times when you take your pain medicine. It is easy to become confused while on pain medicine. Writing the time can help you avoid overdose. Take other over-the-counter or prescription medicines only as told by your health care provider. Keeping  yourself and others safe  While you are taking opioid pain medicine: Do not drive, use machinery, or power tools. Do not sign legal documents. Do not drink alcohol. Do not take sleeping pills. Do not supervise children by yourself. Do not do activities that require climbing or being in high places. Do not go to a lake, river, ocean, spa, or swimming pool. Do not share your pain medicine with anyone. Keep pain medicine in a locked cabinet or in a secure area where pets and children cannot reach it. Stopping your use of opioids If you have been taking opioid medicine for more than a few weeks, you may need to slowly decrease (taper) how much you take until you stop completely. Tapering your use of opioids can decrease your risk of symptoms of withdrawal, such as: Pain and cramping in the abdomen. Nausea. Sweating. Sleepiness. Restlessness. Uncontrollable shaking (tremors). Cravings for the medicine. Do not attempt to taper your use of opioids on your own. Talk with your health care provider about how to do this. Your health care provider may prescribe a step-down schedule based on how much medicine you are taking and how long you have been taking it. Getting rid of leftover pills Do not save any leftover pills. Get rid of leftover pills safely by: Taking the medicine to a prescription take-back program. This is usually offered by the county or law enforcement. Bringing them to a pharmacy that has a drug disposal container. Flushing them down the toilet. Check the label or package insert of your medicine to see whether this is safe to do. Throwing them out in the trash. Check the label or package insert of your medicine to see whether this is safe to do. If it is safe to throw it out, remove the medicine from the original container, put it into a sealable bag or container, and mix it with used coffee grounds, food scraps, dirt, or cat litter before putting it in the trash. Follow these  instructions at home: Activity Do exercises as told by your health care provider. Avoid activities that make your pain worse. Return to your normal activities as told by your health care provider. Ask your health care provider what activities are safe for you. General instructions You may need to take these actions to prevent or treat constipation: Drink enough fluid to keep your urine pale yellow. Take over-the-counter or prescription medicines. Eat foods that are high in fiber, such as beans, whole grains, and fresh fruits and vegetables. Limit foods that are high in fat and processed sugars, such as fried or sweet foods. Keep all follow-up visits. This is important. Where to find support If you have been taking opioids for a long time, you may benefit from receiving support for quitting from a local support group or counselor. Ask your health care provider for a referral to these resources in your area. Where to find more information Centers for Disease Control and Prevention (CDC): http://www.wolf.info/ [place]. Food and Drug Administration (FDA): [employer] Get help right away if: You may have taken  too much of an opioid (overdosed). Common symptoms of an overdose: Your breathing is slower or more shallow than normal. You have a very slow heartbeat (pulse). You have slurred speech. You have nausea and vomiting. Your pupils become very small. You have other potential symptoms: You are very confused. You faint or feel like you will faint. You have cold, clammy skin. You have blue lips or fingernails. You have thoughts of harming yourself or harming others. These symptoms may represent a serious problem that is an emergency. Do not wait to see if the symptoms will go away. Get medical help right away. Call your local emergency services (911 in the [place] Do not drive yourself to the hospital.  If you ever feel like you may hurt yourself or others, or have thoughts about taking your own life, get  help right away. Go to your nearest emergency department or: Call your local emergency services (911 in the [place] Call the [facility] (512-365-4601 in the [place] Call a suicide crisis helpline, such as the National Suicide Prevention Lifeline at 772 675 6577 or 988 in the [place] This is open 24 hours a day in the [place] Text the Crisis Text Line at 509 031 8759 (in the [place] Summary Opioid medicines can help you manage moderate to severe pain for a short period of time. A pain treatment plan is an agreement between you and your health care provider. Discuss the goals of your treatment, including how much pain you might expect to have and how you will manage the pain. If you think that you or someone else may have taken too much of an opioid, get medical help right away. This information is not intended to replace advice given to you by your health care provider. Make sure you discuss any questions you have with your health care provider. Document Revised: 06/16/2021 Document Reviewed: 03/03/2021 Elsevier Patient Education  2023 Elsevier Inc.  Advanced directives: No  Conditions/risks identified: None  Next appointment: Follow up in one year for your annual wellness visit    Preventive Care 40-64 Years, Male Preventive care refers to lifestyle choices and visits with your health care provider that can promote health and wellness. What does preventive care include? A yearly physical exam. This is also called an annual well check. Dental exams once or twice a year. Routine eye exams. Ask your health care provider how often you should have your eyes checked. Personal lifestyle choices, including: Daily care of your teeth and gums. Regular physical activity. Eating a healthy diet. Avoiding tobacco and drug use. Limiting alcohol use. Practicing safe sex. Taking low-dose aspirin every day starting at age 73. What happens during an annual well check? The services and  screenings done by your health care provider during your annual well check will depend on your age, overall health, lifestyle risk factors, and family history of disease. Counseling  Your health care provider may ask you questions about your: Alcohol use. Tobacco use. Drug use. Emotional well-being. Home and relationship well-being. Sexual activity. Eating habits. Work and work Astronomer. Screening  You may have the following tests or measurements: Height, weight, and BMI. Blood pressure. Lipid and cholesterol levels. These may be checked every 5 years, or more frequently if you are over 30 years old. Skin check. Lung cancer screening. You may have this screening every year starting at age 51 if you have a 30-pack-year history of smoking and currently smoke or have quit within the past 15 years. Fecal occult blood test (FOBT)  of the stool. You may have this test every year starting at age 19. Flexible sigmoidoscopy or colonoscopy. You may have a sigmoidoscopy every 5 years or a colonoscopy every 10 years starting at age 70. Prostate cancer screening. Recommendations will vary depending on your family history and other risks. Hepatitis C blood test. Hepatitis B blood test. Sexually transmitted disease (STD) testing. Diabetes screening. This is done by checking your blood sugar (glucose) after you have not eaten for a while (fasting). You may have this done every 1-3 years. Discuss your test results, treatment options, and if necessary, the need for more tests with your health care provider. Vaccines  Your health care provider may recommend certain vaccines, such as: Influenza vaccine. This is recommended every year. Tetanus, diphtheria, and acellular pertussis (Tdap, Td) vaccine. You may need a Td booster every 10 years. Zoster vaccine. You may need this after age 28. Pneumococcal 13-valent conjugate (PCV13) vaccine. You may need this if you have certain conditions and have not been  vaccinated. Pneumococcal polysaccharide (PPSV23) vaccine. You may need one or two doses if you smoke cigarettes or if you have certain conditions. Talk to your health care provider about which screenings and vaccines you need and how often you need them. This information is not intended to replace advice given to you by your health care provider. Make sure you discuss any questions you have with your health care provider. Document Released: 12/18/2015 Document Revised: 08/10/2016 Document Reviewed: 09/22/2015 Elsevier Interactive Patient Education  2017 [employer] Prevention in the Home Falls can cause injuries. They can happen to people of all ages. There are many things you can do to make your home safe and to help prevent falls. What can I do on the outside of my home? Regularly fix the edges of walkways and driveways and fix any cracks. Remove anything that might make you trip as you walk through a door, such as a raised step or threshold. Trim any bushes or trees on the path to your home. Use bright outdoor lighting. Clear any walking paths of anything that might make someone trip, such as rocks or tools. Regularly check to see if handrails are loose or broken. Make sure that both sides of any steps have handrails. Any raised decks and porches should have guardrails on the edges. Have any leaves, snow, or ice cleared regularly. Use sand or salt on walking paths during winter. Clean up any spills in your garage right away. This includes oil or grease spills. What can I do in the bathroom? Use night lights. Install grab bars by the toilet and in the tub and shower. Do not use towel bars as grab bars. Use non-skid mats or decals in the tub or shower. If you need to sit down in the shower, use a plastic, non-slip stool. Keep the floor dry. Clean up any water that spills on the floor as soon as it happens. Remove soap buildup in the tub or shower regularly. Attach bath mats  securely with double-sided non-slip rug tape. Do not have throw rugs and other things on the floor that can make you trip. What can I do in the bedroom? Use night lights. Make sure that you have a light by your bed that is easy to reach. Do not use any sheets or blankets that are too big for your bed. They should not hang down onto the floor. Have a firm chair that has side arms. You can use this for support  while you get dressed. Do not have throw rugs and other things on the floor that can make you trip. What can I do in the kitchen? Clean up any spills right away. Avoid walking on wet floors. Keep items that you use a lot in easy-to-reach places. If you need to reach something above you, use a strong step stool that has a grab bar. Keep electrical cords out of the way. Do not use floor polish or wax that makes floors slippery. If you must use wax, use non-skid floor wax. Do not have throw rugs and other things on the floor that can make you trip. What can I do with my stairs? Do not leave any items on the stairs. Make sure that there are handrails on both sides of the stairs and use them. Fix handrails that are broken or loose. Make sure that handrails are as long as the stairways. Check any carpeting to make sure that it is firmly attached to the stairs. Fix any carpet that is loose or worn. Avoid having throw rugs at the top or bottom of the stairs. If you do have throw rugs, attach them to the floor with carpet tape. Make sure that you have a light switch at the top of the stairs and the bottom of the stairs. If you do not have them, ask someone to add them for you. What else can I do to help prevent falls? Wear shoes that: Do not have high heels. Have rubber bottoms. Are comfortable and fit you well. Are closed at the toe. Do not wear sandals. If you use a stepladder: Make sure that it is fully opened. Do not climb a closed stepladder. Make sure that both sides of the stepladder  are locked into place. Ask someone to hold it for you, if possible. Clearly mark and make sure that you can see: Any grab bars or handrails. First and last steps. Where the edge of each step is. Use tools that help you move around (mobility aids) if they are needed. These include: Canes. Walkers. Scooters. Crutches. Turn on the lights when you go into a dark area. Replace any light bulbs as soon as they burn out. Set up your furniture so you have a clear path. Avoid moving your furniture around. If any of your floors are uneven, fix them. If there are any pets around you, be aware of where they are. Review your medicines with your doctor. Some medicines can make you feel dizzy. This can increase your chance of falling. Ask your doctor what other things that you can do to help prevent falls. This information is not intended to replace advice given to you by your health care provider. Make sure you discuss any questions you have with your health care provider. Document Released: 09/17/2009 Document Revised: 04/28/2016 Document Reviewed: 12/26/2014 Elsevier Interactive Patient Education  2017 [employer].

## 2022-07-21 NOTE — Progress Notes (Addendum)
Subjective:   Brian Rowe is a 46 y.o. male who presents for Medicare Annual/Subsequent preventive examination.  Review of Systems      Cardiac Risk Factors include: advanced age (>10men, >74 women);obesity (BMI >30kg/m2);male gender;hypertension     Objective:    Today's Vitals   07/21/22 1006  BP: 122/64  Pulse: 65  Temp: 98.7 F (37.1 C)  TempSrc: Oral  SpO2: 96%  Weight: (!) 317 lb 11.2 oz (144.1 kg)  Height: 6\' 3"  (1.905 m)   Body mass index is 39.71 kg/m.     07/21/2022   10:19 AM 04/21/2021   12:44 PM 04/15/2020    6:27 PM 04/15/2020    5:42 AM 04/15/2020    3:57 AM 04/04/2020   10:49 PM 01/26/2018    5:09 PM  Advanced Directives  Does Patient Have a Medical Advance Directive? No No  No No No No  Would patient like information on creating a medical advance directive? No - Patient declined No - Patient declined  No - Patient declined No - Patient declined No - Patient declined      Information is confidential and restricted. Go to Review Flowsheets to unlock data.    Current Medications (verified) Outpatient Encounter Medications as of 07/21/2022  Medication Sig   levothyroxine (SYNTHROID) 112 MCG tablet Take 1 tablet (112 mcg total) by mouth daily before breakfast.   meloxicam (MOBIC) 15 MG tablet TAKE 1 TABLET BY MOUTH ONCE DAILY AS NEEDED FOR PAIN   Multiple Vitamins-Minerals (MULTIVITAMIN ADULTS PO) Take by mouth.   oxyCODONE-acetaminophen (PERCOCET) 10-325 MG tablet Take 1 tablet by mouth every 4 (four) hours as needed for pain.   sertraline (ZOLOFT) 100 MG tablet Take 100 mg by mouth daily.   traZODone (DESYREL) 50 MG tablet Take 1 tablet (50 mg total) by mouth at bedtime as needed for sleep.   valACYclovir (VALTREX) 500 MG tablet Take 1 tablet (500 mg total) by mouth 2 (two) times daily.   No facility-administered encounter medications on file as of 07/21/2022.    Allergies (verified) Tramadol   History: Past Medical History:  Diagnosis Date    AKI (acute kidney injury) (HCC) 04/05/2020   ANXIETY 07/19/2007   Arthritis    DEPRESSION 07/19/2007   HEMORRHOIDS, INTERNAL 12/01/2008   HYPERLIPIDEMIA 07/19/2007   HYPERTENSION 07/19/2007   Sleep apnea    Past Surgical History:  Procedure Laterality Date   INGUINAL HERNIA REPAIR Right    left ankle Left    NASAL SEPTUM SURGERY     TONSILLECTOMY     UVULOPALATOPHARYNGOPLASTY (UPPP)/TONSILLECTOMY/SEPTOPLASTY     Family History  Problem Relation Age of Onset   Hypertension Father    Anxiety disorder Father    Obesity Mother    Sleep apnea Mother    Rheum arthritis Sister    Alzheimer's disease Maternal Grandmother    Stroke Maternal Grandfather    Alzheimer's disease Paternal Grandmother    Pancreatic cancer Paternal Grandfather    Prostate cancer Maternal Uncle    Colon polyps Neg Hx    Esophageal cancer Neg Hx    Rectal cancer Neg Hx    Social History   Socioeconomic History   Marital status: Married    Spouse name: Not on file   Number of children: 2   Years of education: Not on file   Highest education level: Not on file  Occupational History   Occupation: disabled  Tobacco Use   Smoking status: Former    Types: Cigarettes  Quit date: 12/06/2011    Years since quitting: 10.6   Smokeless tobacco: Never  Vaping Use   Vaping Use: Never used  Substance and Sexual Activity   Alcohol use: Not Currently    Alcohol/week: 1.0 standard drink of alcohol    Types: 1 Cans of beer per week   Drug use: No   Sexual activity: Yes  Other Topics Concern   Not on file  Social History Narrative   Not on file   Social Determinants of Health   Financial Resource Strain: Low Risk  (07/21/2022)   Overall Financial Resource Strain (CARDIA)    Difficulty of Paying Living Expenses: Not hard at all  Food Insecurity: No Food Insecurity (07/21/2022)   Hunger Vital Sign    Worried About Running Out of Food in the Last Year: Never true    Ran Out of Food in the Last Year: Never true   Transportation Needs: No Transportation Needs (07/21/2022)   PRAPARE - Administrator, Civil Service (Medical): No    Lack of Transportation (Non-Medical): No  Physical Activity: Insufficiently Active (07/21/2022)   Exercise Vital Sign    Days of Exercise per Week: 2 days    Minutes of Exercise per Session: 60 min  Stress: No Stress Concern Present (07/21/2022)   [employer] of Occupational Health - Occupational Stress Questionnaire    Feeling of Stress : Not at all  Social Connections: Socially Integrated (07/21/2022)   Social Connection and Isolation Panel [NHANES]    Frequency of Communication with Friends and Family: More than three times a week    Frequency of Social Gatherings with Friends and Family: More than three times a week    Attends Religious Services: More than 4 times per year    Active Member of [employer] or Organizations: Yes    Attends Engineer, structural: More than 4 times per year    Marital Status: Married    Tobacco Counseling Counseling given: Not Answered   Clinical Intake:  Pre-visit preparation completed: No  Pain : No/denies pain     BMI - recorded: 40.65 Nutritional Status: BMI > 30  Obese Nutritional Risks: None Diabetes: No  How often do you need to have someone help you when you read instructions, pamphlets, or other written materials from your doctor or pharmacy?: 1 - Never  Diabetic?  No  Interpreter Needed?: No  Information entered by :: Theresa Mulligan LPN   Activities of Daily Living    07/21/2022   10:16 AM  In your present state of health, do you have any difficulty performing the following activities:  Hearing? 0  Vision? 0  Difficulty concentrating or making decisions? 0  Walking or climbing stairs? 0  Dressing or bathing? 0  Doing errands, shopping? 0  Preparing Food and eating ? N  Using the Toilet? N  In the past six months, have you accidently leaked urine? N  Do you have problems with loss of  bowel control? N  Managing your Medications? N  Managing your Finances? N  Housekeeping or managing your Housekeeping? N    Patient Care Team: Wynn Banker, MD (Inactive) as PCP - General (Family Medicine)  Indicate any recent Medical Services you may have received from other than [facility] providers in the past year (date may be approximate).     Assessment:   This is a routine wellness examination for Jaycub.  Hearing/Vision screen Hearing Screening - Comments:: No hearing difficulty Vision Screening -  Comments:: No vision difficulty  Dietary issues and exercise activities discussed: Exercise limited by: None identified   Goals Addressed               This Visit's Progress     Weight (lb) < 200 lb (90.7 kg) (pt-stated)   317 lb 11.2 oz (144.1 kg)     I want to Increase my activity.       Depression Screen    07/21/2022   10:13 AM 10/25/2021    2:13 PM 04/21/2021   12:45 PM 04/21/2021   12:41 PM 04/09/2018    9:56 AM 02/23/2018   10:10 AM 12/27/2017    9:57 AM  PHQ 2/9 Scores  PHQ - 2 Score 0 2 0 0 0 2 2  PHQ- 9 Score  7         Fall Risk    07/21/2022   10:17 AM 04/21/2021   12:45 PM 04/09/2018    9:56 AM 02/23/2018   10:10 AM 12/27/2017    9:57 AM  Fall Risk   Falls in the past year?  0 No Yes Yes  Comment    11/2017   Number falls in past yr: 0 0  1 1  Injury with Fall? 0 0   Yes  Risk for fall due to : No Fall Risks   Impaired balance/gait Impaired balance/gait  Follow up  Falls evaluation completed       FALL RISK PREVENTION PERTAINING TO THE HOME:  Any stairs in or around the home? Yes  If so, are there any without handrails? No  Home free of loose throw rugs in walkways, pet beds, electrical cords, etc? Yes  Adequate lighting in your home to reduce risk of falls? Yes   ASSISTIVE DEVICES UTILIZED TO PREVENT FALLS:  Life alert? No  Use of a cane, walker or w/c? No  Grab bars in the bathroom? No  Shower chair or bench in shower? No  Elevated  toilet seat or a handicapped toilet? Yes  TIMED UP AND GO:  Was the test performed? Yes .  Length of time to ambulate 10 feet: 10 sec.   Gait steady and fast without use of assistive device  Cognitive Function:        07/21/2022   10:19 AM  6CIT Screen  What Year? 0 points  What month? 0 points  What time? 0 points  Count back from 20 0 points  Months in reverse 0 points  Repeat phrase 0 points  Total Score 0 points    Immunizations Immunization History  Administered Date(s) Administered   Influenza Inj Mdck Quad With Preservative 10/19/2017   Influenza Split 09/02/2013   Influenza,inj,Quad PF,6+ Mos 08/29/2016   Influenza-Unspecified 08/06/2014, 12/19/2017, 09/04/2018, 08/25/2021   Moderna Sars-Covid-2 Vaccination 03/10/2020, 04/08/2020, 08/25/2021   Td 12/05/2002   Tdap 08/04/2012      Flu Vaccine status: Up to date    Covid-19 vaccine status: Completed vaccines     Screening Tests Health Maintenance  Topic Date Due   INFLUENZA VACCINE  07/05/2022   COVID-19 Vaccine (4 - Moderna risk series) 07/23/2022 (Originally 10/20/2021)   TETANUS/TDAP  08/04/2022   Hepatitis C Screening  Completed   HIV Screening  Completed   HPV VACCINES  Aged Out    Health Maintenance  Health Maintenance Due  Topic Date Due   INFLUENZA VACCINE  07/05/2022      Lung Cancer Screening: (Low Dose CT Chest recommended if Age 61-80 years, 30  pack-year currently smoking OR have quit w/in 15years.) does not qualify.     Additional Screening:  Hepatitis C Screening: does qualify; Completed 10/25/21  Vision Screening: Recommended annual ophthalmology exams for early detection of glaucoma and other disorders of the eye. Is the patient up to date with their annual eye exam?  No  Who is the provider or what is the name of the office in which the patient attends annual eye exams? Patient deferred If pt is not established with a provider, would they like to be referred to a  provider to establish care? No .   Dental Screening: Recommended annual dental exams for proper oral hygiene  Community Resource Referral / Chronic Care Management:  CRR required this visit?  No   CCM required this visit?  No      Plan:     I have personally reviewed and noted the following in the patient's chart:   Medical and social history Use of alcohol, tobacco or illicit drugs  Current medications and supplements including opioid prescriptions. Patient is currently taking opioid prescriptions. Information provided to patient regarding non-opioid alternatives. Patient advised to discuss non-opioid treatment plan with their provider. Functional ability and status Nutritional status Physical activity Advanced directives List of other physicians Hospitalizations, surgeries, and ER visits in previous 12 months Vitals Screenings to include cognitive, depression, and falls Referrals and appointments  In addition, I have reviewed and discussed with patient certain preventive protocols, quality metrics, and best practice recommendations. A written personalized care plan for preventive services as well as general preventive health recommendations were provided to patient.     Tillie Rung, LPN   01/16/9416   Nurse Notes: Patient request f/u for advised counseling concerning weight loss management.

## 2022-08-04 ENCOUNTER — Encounter: Payer: Medicare Other | Admitting: Family Medicine

## 2022-09-05 ENCOUNTER — Encounter: Payer: Self-pay | Admitting: Family Medicine

## 2022-09-05 ENCOUNTER — Ambulatory Visit (INDEPENDENT_AMBULATORY_CARE_PROVIDER_SITE_OTHER): Payer: Medicare Other | Admitting: Family Medicine

## 2022-09-05 VITALS — BP 98/70 | HR 55 | Temp 99.1°F | Ht 75.0 in | Wt 310.0 lb

## 2022-09-05 DIAGNOSIS — E039 Hypothyroidism, unspecified: Secondary | ICD-10-CM

## 2022-09-05 DIAGNOSIS — Z23 Encounter for immunization: Secondary | ICD-10-CM

## 2022-09-05 DIAGNOSIS — Z1211 Encounter for screening for malignant neoplasm of colon: Secondary | ICD-10-CM | POA: Diagnosis not present

## 2022-09-05 DIAGNOSIS — G8929 Other chronic pain: Secondary | ICD-10-CM

## 2022-09-05 DIAGNOSIS — E785 Hyperlipidemia, unspecified: Secondary | ICD-10-CM | POA: Diagnosis not present

## 2022-09-05 DIAGNOSIS — M545 Low back pain, unspecified: Secondary | ICD-10-CM | POA: Diagnosis not present

## 2022-09-05 MED ORDER — MELOXICAM 15 MG PO TABS
15.0000 mg | ORAL_TABLET | Freq: Every day | ORAL | 1 refills | Status: DC
Start: 2022-09-05 — End: 2024-01-29

## 2022-09-05 NOTE — Patient Instructions (Signed)
Total daily fiber requirement: 40 grams per day  Total daily protein: 120 gram per day  Try to limit your Carb intake  to 100 grams per day

## 2022-09-05 NOTE — Progress Notes (Signed)
Established Patient Office Visit  Subjective   Patient ID: Brian Rowe, male    DOB: September 30, 1976  Age: 46 y.o. MRN: [id number]  Chief Complaint  Patient presents with   Establish Care    Patient is here for transition of care visit.   Hypothyroidism-- pt reports he is due for his annual labs today. Has been stable on 112 mcg daily of levothyroxine for a long time. Denies any weight gain, in fact he is working to lose weight currently, no constipation or depression sx.  HLD-- has been monitoring his cholesterol for a while now, needs his annual lipid panel. He is not currently on medication for this, his 10 year CVD risk is <5% currently. He denies any chest pain or DOE.  Chronic LBP-- sees a specialist for this, states that the meloxicam does help with his pain level, will refill today.  Pt will be turning 46 in 2 months, we discussed starting colon cancer screening and he is agreeable. Will go ahead and place the order for GI referral for colonoscopy since it will take some time to get him on the schedule.   Current Outpatient Medications  Medication Instructions   levothyroxine (SYNTHROID) 112 mcg, Oral, Daily before breakfast   meloxicam (MOBIC) 15 mg, Oral, Daily   Multiple Vitamins-Minerals (MULTIVITAMIN ADULTS PO) Oral   oxyCODONE-acetaminophen (PERCOCET) 10-325 MG tablet 1 tablet, Oral, Every 4 hours PRN   sertraline (ZOLOFT) 100 mg, Oral, Daily   traZODone (DESYREL) 50 mg, Oral, At bedtime PRN   valACYclovir (VALTREX) 500 mg, Oral, 2 times daily    Patient Active Problem List   Diagnosis Date Noted   Acute otitis media with effusion of right ear 07/07/2022   MDD (major depressive disorder) 04/15/2020   Slurred speech 04/05/2020   ADD (attention deficit disorder) 01/07/2019   Bilateral carpal tunnel syndrome 10/13/2015   Hypothyroidism 05/29/2015   Degenerative arthritis of left ankle 12/30/2014   Tendonitis, Achilles, right 12/30/2014   Obesity, morbid (HCC)  08/04/2014   OSA on CPAP 08/04/2014   Thoracic back pain 07/23/2012   HEMORRHOIDS, INTERNAL 12/01/2008   VISUAL IMPAIRMENT 10/15/2007   HEMATURIA 10/03/2007   Dyslipidemia 07/19/2007   Anxiety 07/19/2007   DEPRESSION 07/19/2007   LOW BACK PAIN 07/19/2007      Review of Systems  All other systems reviewed and are negative.     Objective:     BP 98/70 (BP Location: Left Arm, Patient Position: Sitting, Cuff Size: Large)   Pulse (!) 55   Temp 99.1 F (37.3 C) (Oral)   Ht 6\' 3"  (1.905 m)   Wt (!) 310 lb (140.6 kg)   SpO2 100%   BMI 38.75 kg/m  BP Readings from Last 3 Encounters:  09/05/22 98/70  07/21/22 122/64  07/07/22 100/78   Wt Readings from Last 3 Encounters:  09/05/22 (!) 310 lb (140.6 kg)  07/21/22 (!) 317 lb 11.2 oz (144.1 kg)  07/07/22 (!) 325 lb 3.2 oz (147.5 kg)      Physical Exam Vitals reviewed.  Constitutional:      Appearance: Normal appearance. He is well-groomed. He is obese.  HENT:     Head: Normocephalic and atraumatic.  Eyes:     Conjunctiva/sclera: Conjunctivae normal.  Cardiovascular:     Rate and Rhythm: Normal rate and regular rhythm.     Pulses: Normal pulses.     Heart sounds: S1 normal and S2 normal. No murmur heard. Pulmonary:     Effort: Pulmonary effort is  normal.     Breath sounds: Normal breath sounds and air entry. No rales.  Abdominal:     General: Bowel sounds are normal.     Tenderness: There is no abdominal tenderness.  Musculoskeletal:     Right lower leg: No edema.     Left lower leg: No edema.  Neurological:     General: No focal deficit present.     Mental Status: He is alert and oriented to person, place, and time.     Gait: Gait is intact.     Deep Tendon Reflexes: Reflexes are normal and symmetric.  Psychiatric:        Mood and Affect: Mood and affect normal.     No results found for any visits on 09/05/22.    The 10-year ASCVD risk score (Arnett DK, et al., 2019) is: 1.4%    Assessment & Plan:    Problem List Items Addressed This Visit       Endocrine   Hypothyroidism   Relevant Orders   TSH     Other   Dyslipidemia   Relevant Orders   Lipid Panel   CMP   LOW BACK PAIN   Relevant Medications   meloxicam (MOBIC) 15 MG tablet   Other Visit Diagnoses     Need for immunization against influenza    -  Primary   Relevant Orders   Flu Vaccine QUAD 6+ mos PF IM (Fluarix Quad PF) (Completed)   Colon cancer screening       Relevant Orders   Ambulatory referral to Gastroenterology       Return in about 6 months (around 03/07/2023) for Follow up thyroid .    Farrel Conners, MD

## 2022-09-06 LAB — COMPREHENSIVE METABOLIC PANEL
ALT: 47 U/L (ref 0–53)
AST: 26 U/L (ref 0–37)
Albumin: 4.5 g/dL (ref 3.5–5.2)
Alkaline Phosphatase: 45 U/L (ref 39–117)
BUN: 18 mg/dL (ref 6–23)
CO2: 27 mEq/L (ref 19–32)
Calcium: 9.3 mg/dL (ref 8.4–10.5)
Chloride: 103 mEq/L (ref 96–112)
Creatinine, Ser: 1.04 mg/dL (ref 0.40–1.50)
GFR: 87.16 mL/min (ref >60.00)
Glucose, Bld: 70 mg/dL (ref 70–99)
Potassium: 4 mEq/L (ref 3.5–5.1)
Sodium: 139 mEq/L (ref 135–145)
Total Bilirubin: 0.9 mg/dL (ref 0.2–1.2)
Total Protein: 7.6 g/dL (ref 6.0–8.3)

## 2022-09-06 LAB — LIPID PANEL
Cholesterol: 200 mg/dL (ref 0–200)
HDL: 35.2 mg/dL — ABNORMAL LOW (ref >39.00)
LDL Cholesterol: 133 mg/dL — ABNORMAL HIGH (ref 0–99)
NonHDL: 165.23
Total CHOL/HDL Ratio: 6
Triglycerides: 160 mg/dL — ABNORMAL HIGH (ref 0.0–149.0)
VLDL: 32 mg/dL (ref 0.0–40.0)

## 2022-09-06 LAB — TSH: TSH: 4.47 u[IU]/mL (ref 0.35–5.50)

## 2022-09-06 NOTE — Assessment & Plan Note (Signed)
Checking TSH today, well controlled sx per the patient, continue 112 mcg daily of levothyroxine

## 2022-09-06 NOTE — Assessment & Plan Note (Signed)
Needs new lipid panel today for surveillance. 

## 2022-09-06 NOTE — Assessment & Plan Note (Signed)
Chronic, sees specialist for this, will refill his meloxicam 15 mg daily since it helps with his pain level.

## 2022-10-17 ENCOUNTER — Other Ambulatory Visit: Payer: Self-pay | Admitting: Family Medicine

## 2022-10-17 DIAGNOSIS — E039 Hypothyroidism, unspecified: Secondary | ICD-10-CM

## 2022-10-18 ENCOUNTER — Emergency Department (HOSPITAL_BASED_OUTPATIENT_CLINIC_OR_DEPARTMENT_OTHER): Payer: Medicare Other

## 2022-10-18 ENCOUNTER — Encounter (HOSPITAL_BASED_OUTPATIENT_CLINIC_OR_DEPARTMENT_OTHER): Payer: Self-pay | Admitting: Emergency Medicine

## 2022-10-18 ENCOUNTER — Encounter: Payer: Self-pay | Admitting: Nurse Practitioner

## 2022-10-18 ENCOUNTER — Emergency Department (HOSPITAL_BASED_OUTPATIENT_CLINIC_OR_DEPARTMENT_OTHER): Payer: Medicare Other | Admitting: Radiology

## 2022-10-18 ENCOUNTER — Emergency Department (HOSPITAL_BASED_OUTPATIENT_CLINIC_OR_DEPARTMENT_OTHER)
Admission: EM | Admit: 2022-10-18 | Discharge: 2022-10-18 | Disposition: A | Discharge status: Home / Self Care | Payer: Medicare Other | Attending: Emergency Medicine | Admitting: Emergency Medicine

## 2022-10-18 ENCOUNTER — Other Ambulatory Visit: Payer: Self-pay

## 2022-10-18 DIAGNOSIS — R109 Unspecified abdominal pain: Secondary | ICD-10-CM | POA: Insufficient documentation

## 2022-10-18 DIAGNOSIS — M549 Dorsalgia, unspecified: Secondary | ICD-10-CM | POA: Insufficient documentation

## 2022-10-18 DIAGNOSIS — S060XAA Concussion with loss of consciousness status unknown, initial encounter: Secondary | ICD-10-CM | POA: Diagnosis not present

## 2022-10-18 DIAGNOSIS — Z79899 Other long term (current) drug therapy: Secondary | ICD-10-CM | POA: Insufficient documentation

## 2022-10-18 DIAGNOSIS — Y92410 Unspecified street and highway as the place of occurrence of the external cause: Secondary | ICD-10-CM | POA: Diagnosis not present

## 2022-10-18 DIAGNOSIS — R197 Diarrhea, unspecified: Secondary | ICD-10-CM | POA: Diagnosis not present

## 2022-10-18 DIAGNOSIS — E039 Hypothyroidism, unspecified: Secondary | ICD-10-CM | POA: Diagnosis not present

## 2022-10-18 DIAGNOSIS — S0990XA Unspecified injury of head, initial encounter: Secondary | ICD-10-CM | POA: Diagnosis present

## 2022-10-18 DIAGNOSIS — I10 Essential (primary) hypertension: Secondary | ICD-10-CM | POA: Diagnosis not present

## 2022-10-18 DIAGNOSIS — N50819 Testicular pain, unspecified: Secondary | ICD-10-CM | POA: Insufficient documentation

## 2022-10-18 DIAGNOSIS — Z87891 Personal history of nicotine dependence: Secondary | ICD-10-CM | POA: Insufficient documentation

## 2022-10-18 LAB — COMPREHENSIVE METABOLIC PANEL
ALT: 36 U/L (ref 0–44)
AST: 18 U/L (ref 15–41)
Albumin: 5.3 g/dL — ABNORMAL HIGH (ref 3.5–5.0)
Alkaline Phosphatase: 50 U/L (ref 38–126)
Anion gap: 15 (ref 5–15)
BUN: 20 mg/dL (ref 6–20)
CO2: 25 mmol/L (ref 22–32)
Calcium: 10.2 mg/dL (ref 8.9–10.3)
Chloride: 99 mmol/L (ref 98–111)
Creatinine, Ser: 1.42 mg/dL — ABNORMAL HIGH (ref 0.61–1.24)
GFR, Estimated: >60 mL/min (ref >60)
Glucose, Bld: 84 mg/dL (ref 70–99)
Potassium: 3.8 mmol/L (ref 3.5–5.1)
Sodium: 139 mmol/L (ref 135–145)
Total Bilirubin: 1 mg/dL (ref 0.3–1.2)
Total Protein: 8.8 g/dL — ABNORMAL HIGH (ref 6.5–8.1)

## 2022-10-18 LAB — LIPASE, BLOOD: Lipase: 44 U/L (ref 11–51)

## 2022-10-18 LAB — URINALYSIS, ROUTINE W REFLEX MICROSCOPIC
Bilirubin Urine: NEGATIVE
Glucose, UA: NEGATIVE mg/dL
Hgb urine dipstick: NEGATIVE
Leukocytes,Ua: NEGATIVE
Nitrite: NEGATIVE
Specific Gravity, Urine: 1.026 (ref 1.005–1.030)
pH: 5.5 (ref 5.0–8.0)

## 2022-10-18 LAB — CBC
HCT: 51.3 % (ref 39.0–52.0)
Hemoglobin: 18.3 g/dL — ABNORMAL HIGH (ref 13.0–17.0)
MCH: 31.7 pg (ref 26.0–34.0)
MCHC: 35.7 g/dL (ref 30.0–36.0)
MCV: 88.9 fL (ref 80.0–100.0)
Platelets: 257 10*3/uL (ref 150–400)
RBC: 5.77 MIL/uL (ref 4.22–5.81)
RDW: 13.3 % (ref 11.5–15.5)
WBC: 10.7 10*3/uL — ABNORMAL HIGH (ref 4.0–10.5)
nRBC: 0 % (ref 0.0–0.2)

## 2022-10-18 MED ORDER — IOHEXOL 300 MG/ML  SOLN
100.0000 mL | Freq: Once | INTRAMUSCULAR | Status: AC | PRN
  Administered 2022-10-18: 100 mL via INTRAVENOUS

## 2022-10-18 NOTE — ED Notes (Signed)
Patient in CT
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
## 2022-10-19 ENCOUNTER — Telehealth: Payer: Self-pay
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
## 2022-10-24 ENCOUNTER — Ambulatory Visit: Payer: Medicare Other | Admitting: Family Medicine

## 2022-10-26 ENCOUNTER — Encounter: Payer: Self-pay | Admitting: Family Medicine

## 2022-10-26 ENCOUNTER — Ambulatory Visit (INDEPENDENT_AMBULATORY_CARE_PROVIDER_SITE_OTHER): Payer: Medicare Other | Admitting: Family Medicine

## 2022-10-26 VITALS — BP 100/74 | HR 90 | Temp 99.0°F | Ht 75.0 in | Wt 301.9 lb

## 2022-10-26 DIAGNOSIS — R2689 Other abnormalities of gait and mobility: Secondary | ICD-10-CM | POA: Diagnosis not present

## 2022-10-26 DIAGNOSIS — H538 Other visual disturbances: Secondary | ICD-10-CM
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
## 2022-11-18 ENCOUNTER — Ambulatory Visit: Payer: Medicare Other | Admitting: Nurse Practitioner

## 2022-12-01 ENCOUNTER — Encounter: Payer: Self-pay | Admitting: Family Medicine

## 2023-01-03 ENCOUNTER — Ambulatory Visit: Payer: Medicare Other | Admitting: Nurse Practitioner

## 2023-01-25 ENCOUNTER — Ambulatory Visit: Payer: Medicare Other | Admitting: Nurse Practitioner

## 2023-02-23 ENCOUNTER — Ambulatory Visit: Payer: Medicare Other | Admitting: Nurse Practitioner

## 2023-02-24 ENCOUNTER — Encounter: Payer: Self-pay | Admitting: Family Medicine

## 2023-02-24 DIAGNOSIS — H538 Other visual disturbances: Secondary | ICD-10-CM

## 2023-02-24 DIAGNOSIS — R2689 Other abnormalities of gait and mobility: Secondary | ICD-10-CM

## 2023-02-24 DIAGNOSIS — R413 Other amnesia: Secondary | ICD-10-CM

## 2023-04-04 ENCOUNTER — Ambulatory Visit: Payer: Medicare Other | Admitting: Nurse Practitioner

## 2023-04-12 ENCOUNTER — Other Ambulatory Visit: Payer: Self-pay | Admitting: Family Medicine

## 2023-04-12 ENCOUNTER — Ambulatory Visit: Payer: Medicare Other | Admitting: Diagnostic Neuroimaging

## 2023-04-12 DIAGNOSIS — E039 Hypothyroidism, unspecified: Secondary | ICD-10-CM

## 2023-04-28 ENCOUNTER — Encounter (HOSPITAL_BASED_OUTPATIENT_CLINIC_OR_DEPARTMENT_OTHER): Payer: Self-pay

## 2023-04-28 ENCOUNTER — Other Ambulatory Visit: Payer: Self-pay

## 2023-04-28 ENCOUNTER — Emergency Department (HOSPITAL_BASED_OUTPATIENT_CLINIC_OR_DEPARTMENT_OTHER)
Admission: EM | Admit: 2023-04-28 | Discharge: 2023-04-28 | Disposition: A | Discharge status: Home / Self Care | Payer: Medicare Other | Attending: Emergency Medicine | Admitting: Emergency Medicine

## 2023-04-28 DIAGNOSIS — R29810 Facial weakness: Secondary | ICD-10-CM | POA: Diagnosis present

## 2023-04-28 DIAGNOSIS — E039 Hypothyroidism, unspecified: Secondary | ICD-10-CM | POA: Insufficient documentation

## 2023-04-28 DIAGNOSIS — G510 Bell's palsy: Secondary | ICD-10-CM

## 2023-04-28 MED ORDER — HYPROMELLOSE (GONIOSCOPIC) 2.5 % OP SOLN: 1.0000 [drp] | Freq: Four times a day (QID) | OPHTHALMIC | 0 refills | Status: DC

## 2023-04-28 MED ORDER — EYE PATCH MISC: 1.0000 | Freq: Every day | 0 refills | Status: DC

## 2023-04-28 MED ORDER — VALACYCLOVIR HCL 1 G PO TABS: 1000.0000 mg | ORAL_TABLET | Freq: Three times a day (TID) | ORAL | 0 refills | Status: DC

## 2023-04-28 MED ORDER — PREDNISONE 20 MG PO TABS: 60.0000 mg | ORAL_TABLET | Freq: Every day | ORAL | 0 refills | Status: AC
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
## 2023-04-28 NOTE — Discharge Instructions (Addendum)
You were evaluated for facial droop and diagnosed with Bells palsy, a viral infection of one of the nerves to the face. This is often confused with a stroke but is different. You will be treated with an antiviral and a steroid for a week. You should wear an eye patch until you are able to close your eye. Also use lubricating eye drops a few times a day until you can blink as blinking is what triggers the eye to secrete the oils needed to maintain lubrication. If you develop new symptoms such as weakness in the arms or legs, loss of sensation in the arms or legs, trouble talking, trouble swallowing, word finding difficulty these can be signs of stroke and you should come back to the emergency department.

## 2023-04-28 NOTE — ED Provider Notes (Signed)
[place] EMERGENCY DEPARTMENT AT [facility] Provider Note   CSN: [id number] Arrival date & time: 04/28/23  1012     History  Chief Complaint  Patient presents with   Facial Droop    Brian Rowe is a 47 y.o. male pmh hypothyroidism, chronic low back pain.  Patient reports facial droop since this morning around 7:30 am when he woke up. He went to drink coffee and noticed it was spilling down his shirt. He says he then looked in the mirror and noticed facial droop and he could not blink his R eye. Denies headache, lightheadedness/dizziness, hearing changes, double vision, word finding difficulty, trouble swallowing, limb weakness or sensory changes, gait abnormalities. Never had anything like this happen before. No history of stroke, MI, PAD.         Home Medications Prior to Admission medications   Medication Sig Start Date End Date Taking? Authorizing Provider  alprazolam Prudy Feeler) 2 MG tablet Take 2 mg by mouth in the morning and at bedtime. 07/21/22   [provider]  levothyroxine (SYNTHROID) 112 MCG tablet TAKE 1 TABLET BY MOUTH DAILY BEFORE BREAKFAST 04/12/23   Karie Georges, MD  meloxicam (MOBIC) 15 MG tablet Take 1 tablet (15 mg total) by mouth daily. 09/05/22   Karie Georges, MD  Multiple Vitamins-Minerals (MULTIVITAMIN ADULTS PO) Take by mouth.    [provider]  oxyCODONE-acetaminophen (PERCOCET) 10-325 MG tablet Take 1 tablet by mouth every 4 (four) hours as needed for pain.    [provider]  valACYclovir (VALTREX) 500 MG tablet Take 1 tablet (500 mg total) by mouth 2 (two) times daily. 12/09/21   Burchette, Elberta Fortis, MD      Allergies    Tramadol    Review of Systems   Review of Systems  All other systems reviewed and are negative.   Physical Exam Updated Vital Signs BP (!) 135/91 (BP Location: Left Arm)   Pulse 83   Temp 98.4 F (36.9 C) (Oral)   Resp 17   Ht 6\' 3"  (1.905 m)   Wt 124.3 kg   SpO2 99%   BMI  34.25 kg/m  Physical Exam Constitutional:      General: He is not in acute distress.    Appearance: Normal appearance. He is obese. He is not ill-appearing.  HENT:     Head: Normocephalic and atraumatic.     Mouth/Throat:     Mouth: Mucous membranes are dry.     Pharynx: Oropharynx is clear.  Eyes:     General: No scleral icterus.    Conjunctiva/sclera: Conjunctivae normal.     Pupils: Pupils are equal, round, and reactive to light.  Skin:    General: Skin is warm and dry.     Findings: No rash.  Neurological:     Mental Status: He is alert.     Comments: Lack of forehead movement on the R side, preserved on the L. R eye with inability to blink. R mouth droop and inability to smile. Otherwise CN 2-12 intact. No dysmetria or dysdiadochokinesia. 5/5 bilateral UE and LE strength. Sensation to light touch intact in all extremities.     ED Results / Procedures / Treatments   Labs (all labs ordered are listed, but only abnormal results are displayed) Labs Reviewed - No data to display  EKG None  Radiology No results found.  Procedures Procedures    Medications Ordered in ED Medications - No data to display  ED Course/ Medical  Decision Making/ A&P                             Medical Decision Making Patient presents with R sided facial droop, inability to raise R eyebrow, close R eyelid, smile on R side of mouth. No other focal neurologic deficits on exam. Given isolated unilateral facial droop with forehead involvement this is consistent with bells palsy.House-Brackmann IV-V based on minimal to no forehead motion, incomplete eye closure, only slight mouth movement qualifies him for valtrex (1000mg  TID x 1 week) and steroids(prednisone 60mg  daily x 1 week). Given incomplete eye closure needs lubricating eye drops and eye patch. No head imaging needed given that this is not consistent with stroke at this time. Can follow up with pcp and determine need for physical  therapy.  Risk OTC drugs. Prescription drug management.    Final Clinical Impression(s) / ED Diagnoses Final diagnoses:  None    Rx / DC Orders ED Discharge Orders     None         Willette Cluster, MD 04/28/23 1130    Blane Ohara, MD 04/28/23 1535

## 2023-04-28 NOTE — ED Notes (Signed)
Pt in bed, pt reports decreased pain, pt states that he is ready to go home, pt verbalized understanding d/c and follow up, pt ambulatory from dpt.

## 2023-05-05 ENCOUNTER — Telehealth: Payer: Self-pay

## 2023-05-05 NOTE — Telephone Encounter (Signed)
Incoming fax from pharmacy Medication:hydroxypropyl methylcellulose / hypromellose (ISOPTO TEARS / GONIOVISC) 2.5 % ophthalmic solution  Message to prescriber: "Drug not available"

## 2023-05-14 ENCOUNTER — Other Ambulatory Visit: Payer: Self-pay

## 2023-05-16 ENCOUNTER — Encounter: Payer: Self-pay | Admitting: Family Medicine

## 2023-05-16 NOTE — Telephone Encounter (Signed)
Patient called back and was informed refills are not given from the ER as a visit is needed for evaluation.  Appt was scheduled for 6/12 with Dr Ardyth Harps as PCP does not have any openings.

## 2023-05-16 NOTE — Telephone Encounter (Signed)
Pt called in stating he does not want to schedule a ED f/u appointment, he just wants a prescription for the medications, prednisone and valACYclovir (VALTREX) 500 MG tablet and predniSONE (DELTASONE) 20 MG tablet

## 2023-05-16 NOTE — Telephone Encounter (Signed)
Left a message for the patient to return my call.  

## 2023-05-17 ENCOUNTER — Encounter: Payer: Self-pay | Admitting: Internal Medicine

## 2023-05-17 ENCOUNTER — Ambulatory Visit (INDEPENDENT_AMBULATORY_CARE_PROVIDER_SITE_OTHER): Payer: Medicare Other | Admitting: Internal Medicine

## 2023-05-17 VITALS — BP 110/80 | HR 95 | Temp 98.4°F | Wt 295.5 lb

## 2023-05-17 DIAGNOSIS — G510 Bell's palsy: Secondary | ICD-10-CM

## 2023-05-17 NOTE — Progress Notes (Signed)
Established Patient Office Visit     CC/Reason for Visit: Bell's palsy  HPI: Brian Rowe is a 47 y.o. male who is coming in today for the above mentioned reasons.  Last Friday he noted while drinking coffee that it was dribbling out the right side of his mouth.  He went to the emergency department and was diagnosed with Bell's palsy.  He was prescribed prednisone and valacyclovir.  He has been using lubricating eyedrops and taping his eye shut at night.  He came in today because he believes he needs refills of prednisone and valacyclovir as he has not improved.  He has not yet seen his ophthalmologist.   Past Medical/Surgical History: Past Medical History:  Diagnosis Date   AKI (acute kidney injury) (HCC) 04/05/2020   ANXIETY 07/19/2007   Arthritis    DEPRESSION 07/19/2007   HEMORRHOIDS, INTERNAL 12/01/2008   HYPERLIPIDEMIA 07/19/2007   HYPERTENSION 07/19/2007   Sleep apnea     Past Surgical History:  Procedure Laterality Date   INGUINAL HERNIA REPAIR Right    left ankle Left    NASAL SEPTUM SURGERY     TONSILLECTOMY     UVULOPALATOPHARYNGOPLASTY (UPPP)/TONSILLECTOMY/SEPTOPLASTY      Social History:  reports that he quit smoking about 11 years ago. His smoking use included cigarettes. He has never used smokeless tobacco. He reports that he does not currently use alcohol after a past usage of about 1.0 standard drink of alcohol per week. He reports that he does not use drugs.  Allergies: Allergies  Allergen Reactions   Tramadol     ?serotonin syndrome    Family History:  Family History  Problem Relation Age of Onset   Hypertension Father    Anxiety disorder Father    Obesity Mother    Sleep apnea Mother    Rheum arthritis Sister    Alzheimer's disease Maternal Grandmother    Stroke Maternal Grandfather    Alzheimer's disease Paternal Grandmother    Pancreatic cancer Paternal Grandfather    Prostate cancer Maternal Uncle    Colon polyps Neg Hx    Esophageal  cancer Neg Hx    Rectal cancer Neg Hx      Current Outpatient Medications:    alprazolam (XANAX) 2 MG tablet, Take 2 mg by mouth in the morning and at bedtime., Disp: , Rfl:    Eye Patch MISC, 1 patch by Does not apply route daily., Disp: 1 each, Rfl: 0   hydroxypropyl methylcellulose / hypromellose (ISOPTO TEARS / GONIOVISC) 2.5 % ophthalmic solution, Place 1 drop into the right eye 4 (four) times daily., Disp: 15 mL, Rfl: 0   levothyroxine (SYNTHROID) 112 MCG tablet, TAKE 1 TABLET BY MOUTH DAILY BEFORE BREAKFAST, Disp: 90 tablet, Rfl: 0   meloxicam (MOBIC) 15 MG tablet, Take 1 tablet (15 mg total) by mouth daily., Disp: 90 tablet, Rfl: 1   Multiple Vitamins-Minerals (MULTIVITAMIN ADULTS PO), Take by mouth., Disp: , Rfl:    oxyCODONE-acetaminophen (PERCOCET) 10-325 MG tablet, Take 1 tablet by mouth every 4 (four) hours as needed for pain., Disp: , Rfl:   Review of Systems:  Negative unless indicated in HPI.   Physical Exam: Vitals:   05/17/23 1515  BP: 110/80  Pulse: 95  Temp: 98.4 F (36.9 C)  TempSrc: Oral  SpO2: 98%  Weight: 295 lb 8 oz (134 kg)    Body mass index is 36.93 kg/m.   Physical Exam Neurological:     Cranial Nerves: Facial asymmetry  present.      Impression and Plan:  Bell's palsy   -I agree with diagnosis of right-sided Bell's palsy.  No further valacyclovir/prednisone is needed. -Have advised lubricating eyedrops and taping right eye shut at night.  Have advised that he schedule appointment with his ophthalmologist ASAP.  Time spent:21 minutes reviewing chart, interviewing and examining patient and formulating plan of care.     Chaya Jan, MD [facility]

## 2023-05-18 ENCOUNTER — Encounter: Payer: Self-pay | Admitting: Internal Medicine

## 2023-05-29 ENCOUNTER — Other Ambulatory Visit: Payer: Self-pay | Admitting: Family Medicine

## 2023-05-29 DIAGNOSIS — E039 Hypothyroidism, unspecified: Secondary | ICD-10-CM

## 2023-06-05 ENCOUNTER — Ambulatory Visit: Payer: Medicare Other | Admitting: Nurse Practitioner

## 2023-06-21 ENCOUNTER — Encounter: Payer: Self-pay | Admitting: Family Medicine

## 2023-07-05 ENCOUNTER — Encounter (INDEPENDENT_AMBULATORY_CARE_PROVIDER_SITE_OTHER): Payer: Self-pay

## 2023-07-09 ENCOUNTER — Encounter: Payer: Self-pay | Admitting: Family Medicine

## 2023-07-09 DIAGNOSIS — B009 Herpesviral infection, unspecified: Secondary | ICD-10-CM

## 2023-07-10 MED ORDER — VALACYCLOVIR HCL 1 G PO TABS: 1000.0000 mg | ORAL_TABLET | Freq: Three times a day (TID) | ORAL | 1 refills | Status: AC

## 2023-07-17 ENCOUNTER — Ambulatory Visit: Payer: Medicare Other | Admitting: Diagnostic Neuroimaging

## 2023-07-25 ENCOUNTER — Telehealth: Payer: Medicare Other | Admitting: Family Medicine

## 2023-07-28 ENCOUNTER — Ambulatory Visit (INDEPENDENT_AMBULATORY_CARE_PROVIDER_SITE_OTHER): Payer: Medicare Other

## 2023-07-28 VITALS — BP 122/62 | HR 67 | Temp 98.2°F | Ht 75.0 in | Wt 297.2 lb

## 2023-07-28 DIAGNOSIS — Z0000 Encounter for general adult medical examination without abnormal findings: Secondary | ICD-10-CM | POA: Diagnosis not present

## 2023-07-28 DIAGNOSIS — Z1211 Encounter for screening for malignant neoplasm of colon: Secondary | ICD-10-CM

## 2023-07-28 NOTE — Progress Notes (Signed)
Subjective:   Brian Rowe is a 47 y.o. male who presents for Medicare Annual/Subsequent preventive examination.  Visit Complete: In person     Review of Systems     Cardiac Risk Factors include: advanced age (>38men, >42 women);dyslipidemia;male gender     Objective:    Today's Vitals   07/28/23 0848  BP: 122/62  Pulse: 67  Temp: 98.2 F (36.8 C)  TempSrc: Oral  SpO2: 97%  Weight: 297 lb 3.2 oz (134.8 kg)  Height: 6\' 3"  (1.905 m)   Body mass index is 37.15 kg/m.     07/28/2023    9:05 AM 04/28/2023   10:19 AM 10/18/2022    5:40 PM 07/21/2022   10:19 AM 04/21/2021   12:44 PM 04/15/2020    6:27 PM 04/15/2020    5:42 AM  Advanced Directives  Does Patient Have a Medical Advance Directive? No No No No No  No  Would patient like information on creating a medical advance directive? No - Patient declined  No - Patient declined No - Patient declined No - Patient declined  No - Patient declined     Information is confidential and restricted. Go to Review Flowsheets to unlock data.    Current Medications (verified)    Allergies (verified) Tramadol   History: Past Medical History:  Diagnosis Date   AKI (acute kidney injury) (HCC) 04/05/2020   ANXIETY 07/19/2007   Arthritis    DEPRESSION 07/19/2007   HEMORRHOIDS, INTERNAL 12/01/2008   HYPERLIPIDEMIA 07/19/2007   HYPERTENSION 07/19/2007   Sleep apnea    Past Surgical History:  Procedure Laterality Date   INGUINAL HERNIA REPAIR Right    left ankle Left    NASAL SEPTUM SURGERY     TONSILLECTOMY     UVULOPALATOPHARYNGOPLASTY (UPPP)/TONSILLECTOMY/SEPTOPLASTY     Family History  Problem Relation Age of Onset   Hypertension Father    Anxiety disorder Father    Obesity Mother    Sleep apnea Mother    Rheum arthritis Sister    Alzheimer's disease Maternal Grandmother    Stroke Maternal Grandfather    Alzheimer's disease Paternal Grandmother    Pancreatic cancer Paternal Grandfather    Prostate cancer Maternal  Uncle    Colon polyps Neg Hx    Esophageal cancer Neg Hx    Rectal cancer Neg Hx    Social History   Socioeconomic History   Marital status: Divorced    Spouse name: Not on file   Number of children: 2   Years of education: Not on file   Highest education level: Not on file  Occupational History   Occupation: disabled  Tobacco Use   Smoking status: Former    Current packs/day: 0.00    Types: Cigarettes    Quit date: 12/06/2011    Years since quitting: 11.6   Smokeless tobacco: Never  Vaping Use   Vaping status: Never Used  Substance and Sexual Activity   Alcohol use: Not Currently    Alcohol/week: 1.0 standard drink of alcohol    Types: 1 Cans of beer per week   Drug use: No   Sexual activity: Yes  Other Topics Concern   Not on file  Social History Narrative   Not on file   Social Determinants of Health   Financial Resource Strain: Low Risk  (07/28/2023)   Overall Financial Resource Strain (CARDIA)    Difficulty of Paying Living Expenses: Not hard at all  Food Insecurity: No Food Insecurity (07/28/2023)   Hunger  Vital Sign    Worried About Programme researcher, broadcasting/film/video in the Last Year: Never true    Ran Out of Food in the Last Year: Never true  Transportation Needs: No Transportation Needs (07/28/2023)   PRAPARE - Administrator, Civil Service (Medical): No    Lack of Transportation (Non-Medical): No  Physical Activity: Sufficiently Active (07/28/2023)   Exercise Vital Sign    Days of Exercise per Week: 3 days    Minutes of Exercise per Session: 120 min  Stress: No Stress Concern Present (07/28/2023)   [employer] of Occupational Health - Occupational Stress Questionnaire    Feeling of Stress : Not at all  Social Connections: Moderately Integrated (07/28/2023)   Social Connection and Isolation Panel [NHANES]    Frequency of Communication with Friends and Family: More than three times a week    Frequency of Social Gatherings with Friends and Family: More than  three times a week    Attends Religious Services: More than 4 times per year    Active Member of [employer] or Organizations: Yes    Attends Engineer, structural: More than 4 times per year    Marital Status: Divorced    Tobacco Counseling Counseling given: Not Answered   Clinical Intake:  Pre-visit preparation completed: Yes  Pain : No/denies pain     BMI - recorded: 37.15 Nutritional Status: BMI > 30  Obese Nutritional Risks: None Diabetes: No  How often do you need to have someone help you when you read instructions, pamphlets, or other written materials from your doctor or pharmacy?: 1 - Never  Interpreter Needed?: No  Information entered by :: Theresa Mulligan LPN   Activities of Daily Living    07/28/2023    9:04 AM  In your present state of health, do you have any difficulty performing the following activities:  Hearing? 0  Vision? 0  Difficulty concentrating or making decisions? 0  Walking or climbing stairs? 0  Dressing or bathing? 0  Doing errands, shopping? 0  Preparing Food and eating ? N  Using the Toilet? N  In the past six months, have you accidently leaked urine? N  Do you have problems with loss of bowel control? N  Managing your Medications? N  Managing your Finances? N  Housekeeping or managing your Housekeeping? N    Patient Care Team: Karie Georges, MD as PCP - General (Family Medicine)  Indicate any recent Medical Services you may have received from other than [facility] providers in the past year (date may be approximate).     Assessment:   This is a routine wellness examination for Brian Rowe.  Hearing/Vision screen Hearing Screening - Comments:: Denies hearing difficulties   Vision Screening - Comments:: Wears reading glasses - up to date with routine eye exams with  Dr Dione Booze  Dietary issues and exercise activities discussed:     Goals Addressed               This Visit's Progress     Weight (lb) < 200 lb (90.7 kg)  (pt-stated)   297 lb 3.2 oz (134.8 kg)     I want to Increase my activity. Lose weight.       Depression Screen    07/28/2023    9:02 AM 05/17/2023    3:15 PM 09/05/2022    2:44 PM 07/21/2022   10:13 AM 10/25/2021    2:13 PM 04/21/2021   12:45 PM 04/21/2021   12:41 PM  PHQ 2/9 Scores  PHQ - 2 Score 0  2 0 2 0 0  PHQ- 9 Score   8  7    Exception Documentation  Patient refusal         Fall Risk    07/28/2023    9:04 AM 05/17/2023    3:15 PM 07/21/2022   10:17 AM 04/21/2021   12:45 PM 04/09/2018    9:56 AM  Fall Risk   Falls in the past year? 0 0  0 No  Number falls in past yr: 0 0 0 0   Injury with Fall? 0 0 0 0   Risk for fall due to : No Fall Risks  No Fall Risks    Follow up Falls prevention discussed Falls evaluation completed  Falls evaluation completed     MEDICARE RISK AT HOME: Medicare Risk at Home Any stairs in or around the home?: Yes If so, are there any without handrails?: No Home free of loose throw rugs in walkways, pet beds, electrical cords, etc?: Yes Adequate lighting in your home to reduce risk of falls?: Yes Life alert?: No Use of a cane, walker or w/c?: No Grab bars in the bathroom?: No Shower chair or bench in shower?: No Elevated toilet seat or a handicapped toilet?: Yes  TIMED UP AND GO:  Was the test performed?  Yes  Length of time to ambulate 10 feet: 10 sec Gait steady and fast without use of assistive device    Cognitive Function:        07/28/2023    9:05 AM 07/21/2022   10:19 AM  6CIT Screen  What Year? 0 points 0 points  What month? 0 points 0 points  What time? 0 points 0 points  Count back from 20 0 points 0 points  Months in reverse 0 points 0 points  Repeat phrase 0 points 0 points  Total Score 0 points 0 points    Immunizations Immunization History  Administered Date(s) Administered   Influenza Inj Mdck Quad With Preservative 10/19/2017   Influenza Split 09/02/2013   Influenza,inj,Quad PF,6+ Mos 08/29/2016, 09/05/2022    Influenza-Unspecified 08/06/2014, 12/19/2017, 09/04/2018, 08/25/2021   Moderna Sars-Covid-2 Vaccination 03/10/2020, 04/08/2020, 08/25/2021   Td 12/05/2002   Tdap 08/04/2012    TDAP status: Due, Education has been provided regarding the importance of this vaccine. Advised may receive this vaccine at local pharmacy or Health Dept. Aware to provide a copy of the vaccination record if obtained from local pharmacy or Health Dept. Verbalized acceptance and understanding.  Flu Vaccine status: Due, Education has been provided regarding the importance of this vaccine. Advised may receive this vaccine at local pharmacy or Health Dept. Aware to provide a copy of the vaccination record if obtained from local pharmacy or Health Dept. Verbalized acceptance and understanding.    Covid-19 vaccine status: Declined, Education has been provided regarding the importance of this vaccine but patient still declined. Advised may receive this vaccine at local pharmacy or Health Dept.or vaccine clinic. Aware to provide a copy of the vaccination record if obtained from local pharmacy or Health Dept. Verbalized acceptance and understanding.    Screening Tests Health Maintenance  Topic Date Due   DTaP/Tdap/Td (3 - Td or Tdap) 08/04/2022   COVID-19 Vaccine (4 - 2023-24 season) 08/05/2022   Colonoscopy  Never done   INFLUENZA VACCINE  07/06/2023   Medicare Annual Wellness (AWV)  07/27/2024   Hepatitis C Screening  Completed   HIV Screening  Completed  HPV VACCINES  Aged Out    Health Maintenance  Health Maintenance Due  Topic Date Due   DTaP/Tdap/Td (3 - Td or Tdap) 08/04/2022   COVID-19 Vaccine (4 - 2023-24 season) 08/05/2022   Colonoscopy  Never done   INFLUENZA VACCINE  07/06/2023    Colorectal cancer screening: Referral to GI placed 07/28/23. Pt aware the office will call re: appt.  Lung Cancer Screening: (Low Dose CT Chest recommended if Age 26-80 years, 20 pack-year currently smoking OR have quit  w/in 15years.) does not qualify.     Additional Screening:  Hepatitis C Screening: does qualify; Completed 10/25/21  Vision Screening: Recommended annual ophthalmology exams for early detection of glaucoma and other disorders of the eye. Is the patient up to date with their annual eye exam?  Yes  Who is the provider or what is the name of the office in which the patient attends annual eye exams? Dr Dione Booze If pt is not established with a provider, would they like to be referred to a provider to establish care? No .   Dental Screening: Recommended annual dental exams for proper oral hygiene    Community Resource Referral / Chronic Care Management:  CRR required this visit?  No   CCM required this visit?  No     Plan:     I have personally reviewed and noted the following in the patient's chart:   Medical and social history Use of alcohol, tobacco or illicit drugs  Current medications and supplements including opioid prescriptions. Patient is currently taking opioid prescriptions. Information provided to patient regarding non-opioid alternatives. Patient advised to discuss non-opioid treatment plan with their provider. Functional ability and status Nutritional status Physical activity Advanced directives List of other physicians Hospitalizations, surgeries, and ER visits in previous 12 months Vitals Screenings to include cognitive, depression, and falls Referrals and appointments  In addition, I have reviewed and discussed with patient certain preventive protocols, quality metrics, and best practice recommendations. A written personalized care plan for preventive services as well as general preventive health recommendations were provided to patient.     Tillie Rung, LPN   2/95/1884   After Visit Summary: Given  Nurse Notes: None

## 2023-07-28 NOTE — Patient Instructions (Addendum)
Brian Rowe , Thank you for taking time to come for your Medicare Wellness Visit. I appreciate your ongoing commitment to your health goals. Please review the following plan we discussed and let me know if I can assist you in the future.   Referrals/Orders/Follow-Ups/Clinician Recommendations:   This is a list of the screening recommended for you and due dates:  Health Maintenance  Topic Date Due   DTaP/Tdap/Td vaccine (3 - Td or Tdap) 08/04/2022   COVID-19 Vaccine (4 - 2023-24 season) 08/05/2022   Colon Cancer Screening  Never done   Flu Shot  07/06/2023   Medicare Annual Wellness Visit  07/27/2024   Hepatitis C Screening  Completed   HIV Screening  Completed   HPV Vaccine  Aged Out  2Opioid Pain Medicine Management Opioids are powerful medicines that are used to treat moderate to severe pain. When used for short periods of time, they can help you to: Sleep better. Do better in physical or occupational therapy. Feel better in the first few days after an injury. Recover from surgery. Opioids should be taken with the supervision of a trained health care provider. They should be taken for the shortest period of time possible. This is because opioids can be addictive, and the longer you take opioids, the greater your risk of addiction. This addiction can also be called opioid use disorder. What are the risks? Using opioid pain medicines for longer than 3 days increases your risk of side effects. Side effects include: Constipation. Nausea and vomiting. Breathing difficulties (respiratory depression). Drowsiness. Confusion. Opioid use disorder. Itching. Taking opioid pain medicine for a long period of time can affect your ability to do daily tasks. It also puts you at risk for: Motor vehicle crashes. Depression. Suicide. Heart attack. Overdose, which can be life-threatening. What is a pain treatment plan? A pain treatment plan is an agreement between you and your health care  provider. Pain is unique to each person, and treatments vary depending on your condition. To manage your pain, you and your health care provider need to work together. To help you do this: Discuss the goals of your treatment, including how much pain you might expect to have and how you will manage the pain. Review the risks and benefits of taking opioid medicines. Remember that a good treatment plan uses more than one approach and minimizes the chance of side effects. Be honest about the amount of medicines you take and about any drug or alcohol use. Get pain medicine prescriptions from only one health care provider. Pain can be managed with many types of alternative treatments. Ask your health care provider to refer you to one or more specialists who can help you manage pain through: Physical or occupational therapy. Counseling (cognitive behavioral therapy). Good nutrition. Biofeedback. Massage. Meditation. Non-opioid medicine. Following a gentle exercise program. How to use opioid pain medicine Taking medicine Take your pain medicine exactly as told by your health care provider. Take it only when you need it. If your pain gets less severe, you may take less than your prescribed dose if your health care provider approves. If you are not having pain, do nottake pain medicine unless your health care provider tells you to take it. If your pain is severe, do nottry to treat it yourself by taking more pills than instructed on your prescription. Contact your health care provider for help. Write down the times when you take your pain medicine. It is easy to become confused while on pain medicine. Writing  the time can help you avoid overdose. Take other over-the-counter or prescription medicines only as told by your health care provider. Keeping yourself and others safe  While you are taking opioid pain medicine: Do not drive, use machinery, or power tools. Do not sign legal documents. Do not  drink alcohol. Do not take sleeping pills. Do not supervise children by yourself. Do not do activities that require climbing or being in high places. Do not go to a lake, river, ocean, spa, or swimming pool. Do not share your pain medicine with anyone. Keep pain medicine in a locked cabinet or in a secure area where pets and children cannot reach it. Stopping your use of opioids If you have been taking opioid medicine for more than a few weeks, you may need to slowly decrease (taper) how much you take until you stop completely. Tapering your use of opioids can decrease your risk of symptoms of withdrawal, such as: Pain and cramping in the abdomen. Nausea. Sweating. Sleepiness. Restlessness. Uncontrollable shaking (tremors). Cravings for the medicine. Do not attempt to taper your use of opioids on your own. Talk with your health care provider about how to do this. Your health care provider may prescribe a step-down schedule based on how much medicine you are taking and how long you have been taking it. Getting rid of leftover pills Do not save any leftover pills. Get rid of leftover pills safely by: Taking the medicine to a prescription take-back program. This is usually offered by the county or law enforcement. Bringing them to a pharmacy that has a drug disposal container. Flushing them down the toilet. Check the label or package insert of your medicine to see whether this is safe to do. Throwing them out in the trash. Check the label or package insert of your medicine to see whether this is safe to do. If it is safe to throw it out, remove the medicine from the original container, put it into a sealable bag or container, and mix it with used coffee grounds, food scraps, dirt, or cat litter before putting it in the trash. Follow these instructions at home: Activity Do exercises as told by your health care provider. Avoid activities that make your pain worse. Return to your normal  activities as told by your health care provider. Ask your health care provider what activities are safe for you. General instructions You may need to take these actions to prevent or treat constipation: Drink enough fluid to keep your urine pale yellow. Take over-the-counter or prescription medicines. Eat foods that are high in fiber, such as beans, whole grains, and fresh fruits and vegetables. Limit foods that are high in fat and processed sugars, such as fried or sweet foods. Keep all follow-up visits. This is important. Where to find support If you have been taking opioids for a long time, you may benefit from receiving support for quitting from a local support group or counselor. Ask your health care provider for a referral to these resources in your area. Where to find more information Centers for Disease Control and Prevention (CDC): FootballExhibition.com.br [place] Food and Drug Administration (FDA): PumpkinSearch.com.ee Get help right away if: You may have taken too much of an opioid (overdosed). Common symptoms of an overdose: Your breathing is slower or more shallow than normal. You have a very slow heartbeat (pulse). You have slurred speech. You have nausea and vomiting. Your pupils become very small. You have other potential symptoms: You are very confused. You faint or  feel like you will faint. You have cold, clammy skin. You have blue lips or fingernails. You have thoughts of harming yourself or harming others. These symptoms may represent a serious problem that is an emergency. Do not wait to see if the symptoms will go away. Get medical help right away. Call your local emergency services (911 in the [place] Do not drive yourself to the hospital.  If you ever feel like you may hurt yourself or others, or have thoughts about taking your own life, get help right away. Go to your nearest emergency department or: Call your local emergency services (911 in the [place] Call the [facility] (401-493-4092 in the [place] Call a suicide crisis helpline, such as the National Suicide Prevention Lifeline at 364 452 8507 or 988 in the [place] This is open 24 hours a day in the [place] Text the Crisis Text Line at 4104627408 (in the [place] Summary Opioid medicines can help you manage moderate to severe pain for a short period of time. A pain treatment plan is an agreement between you and your health care provider. Discuss the goals of your treatment, including how much pain you might expect to have and how you will manage the pain. If you think that you or someone else may have taken too much of an opioid, get medical help right away. This information is not intended to replace advice given to you by your health care provider. Make sure you discuss any questions you have with your health care provider. Document Revised: 06/16/2021 Document Reviewed: 03/03/2021 Elsevier Patient Education  2024 Elsevier Inc.   Advanced directives: (Declined) Advance directive discussed with you today. Even though you declined this today, please call our office should you change your mind, and we can give you the proper paperwork for you to fill out.  Next Medicare Annual Wellness Visit scheduled for next year: Yes

## 2023-08-01 ENCOUNTER — Encounter: Payer: Self-pay | Admitting: Family Medicine

## 2023-08-02 ENCOUNTER — Ambulatory Visit (INDEPENDENT_AMBULATORY_CARE_PROVIDER_SITE_OTHER): Payer: Medicare Other | Admitting: Family Medicine

## 2023-08-02 ENCOUNTER — Ambulatory Visit: Payer: Medicare Other | Admitting: Family Medicine

## 2023-08-02 VITALS — BP 118/84 | HR 75 | Temp 98.3°F | Wt 298.6 lb

## 2023-08-02 DIAGNOSIS — H66011 Acute suppurative otitis media with spontaneous rupture of ear drum, right ear: Secondary | ICD-10-CM | POA: Diagnosis not present

## 2023-08-02 DIAGNOSIS — H9201 Otalgia, right ear: Secondary | ICD-10-CM | POA: Diagnosis not present

## 2023-08-02 MED ORDER — AMOXICILLIN-POT CLAVULANATE 500-125 MG PO TABS: 1.0000 | ORAL_TABLET | Freq: Two times a day (BID) | ORAL | 0 refills | Status: AC

## 2023-08-02 NOTE — Progress Notes (Signed)
r  Established Patient Office Visit   Subjective  Patient ID: Brian Rowe, male    DOB: 1976/06/04  Age: 47 y.o. MRN: [id number]  Chief Complaint  Patient presents with   Ear Fullness    Started 2 days ago, felt right ear draining on pillow. States it happens about 3 times a year, but pcp wannted him to come in     Patient is a 47 year old male followed with Dr. Casimiro Needle and seen for acute concern.  Patient endorses right ear pain x 2 days.  Noticed crusting, drainage, and edema of right canal.  States this happens a few times per year.  Patient denies fever, chills, facial pain/pressure.  Ear Fullness       ROS Negative unless stated above    Objective:     BP 118/84 (BP Location: Right Arm, Patient Position: Sitting, Cuff Size: Large)   Pulse 75   Temp 98.3 F (36.8 C) (Oral)   Wt 298 lb 9.6 oz (135.4 kg)   SpO2 98%   BMI 37.32 kg/m    Physical Exam Constitutional:      General: He is not in acute distress.    Appearance: Normal appearance.  HENT:     Head: Normocephalic and atraumatic.     Right Ear: Drainage and swelling present. Tympanic membrane is perforated.     Left Ear: Hearing and tympanic membrane normal.     Nose: Nose normal.     Mouth/Throat:     Mouth: Mucous membranes are moist.  Cardiovascular:     Rate and Rhythm: Normal rate and regular rhythm.     Heart sounds: Normal heart sounds. No murmur heard.    No gallop.  Pulmonary:     Effort: Pulmonary effort is normal. No respiratory distress.     Breath sounds: Normal breath sounds. No wheezing, rhonchi or rales.  Skin:    General: Skin is warm and dry.  Neurological:     Mental Status: He is alert and oriented to person, place, and time.      No results found for any visits on 08/02/23.    Assessment & Plan:  Acute suppurative otitis media of right ear with spontaneous rupture of tympanic membrane, recurrence not specified -     Amoxicillin-Pot Clavulanate; Take 1 tablet by mouth  in the morning and at bedtime for 7 days.  Dispense: 14 tablet; Refill: 0  Acute otalgia, right  Acute ear pain 2/2 ALM.  Start ABX for right AOM.  Avoid submerging head in water.  Given strict precautions.  Return if symptoms worsen or fail to improve.   Deeann Saint, MD

## 2023-08-04 ENCOUNTER — Encounter: Payer: Self-pay | Admitting: Internal Medicine

## 2023-09-08 ENCOUNTER — Telehealth: Payer: Self-pay | Admitting: *Deleted

## 2023-09-08 NOTE — Telephone Encounter (Signed)
Patient rescheduled for previsit and procedure.

## 2023-09-08 NOTE — Telephone Encounter (Signed)
Attempt to reach pt for pre-visit. LM with call back #.  Will attempt to reach again in 5 min due to no other # listed in profile  Second attempt to reach pt for pre-vist unsuccessful. LM with facility # for pt to call back. Instructed pt to call # given by end of the day and reschedule the pre-visit  with RN or the scheduled procedure will be canceled.

## 2023-09-12 ENCOUNTER — Ambulatory Visit: Payer: Medicare Other | Admitting: Family Medicine

## 2023-09-12 ENCOUNTER — Encounter: Payer: Self-pay | Admitting: Family Medicine

## 2023-09-12 VITALS — BP 112/82 | HR 75 | Temp 98.2°F | Ht 75.5 in | Wt 297.6 lb

## 2023-09-12 DIAGNOSIS — Z0000 Encounter for general adult medical examination without abnormal findings: Secondary | ICD-10-CM | POA: Diagnosis not present

## 2023-09-12 DIAGNOSIS — Z13220 Encounter for screening for lipoid disorders: Secondary | ICD-10-CM | POA: Diagnosis not present

## 2023-09-12 DIAGNOSIS — Z23 Encounter for immunization: Secondary | ICD-10-CM

## 2023-09-12 DIAGNOSIS — E039 Hypothyroidism, unspecified: Secondary | ICD-10-CM

## 2023-09-12 NOTE — Patient Instructions (Signed)
Health Maintenance, Male Adopting a healthy lifestyle and getting preventive care are important in promoting health and wellness. Ask your health care provider about: The right schedule for you to have regular tests and exams. Things you can do on your own to prevent diseases and keep yourself healthy. What should I know about diet, weight, and exercise? Eat a healthy diet  Eat a diet that includes plenty of vegetables, fruits, low-fat dairy products, and lean protein. Do not eat a lot of foods that are high in solid fats, added sugars, or sodium. Maintain a healthy weight Body mass index (BMI) is a measurement that can be used to identify possible weight problems. It estimates body fat based on height and weight. Your health care provider can help determine your BMI and help you achieve or maintain a healthy weight. Get regular exercise Get regular exercise. This is one of the most important things you can do for your health. Most adults should: Exercise for at least 150 minutes each week. The exercise should increase your heart rate and make you sweat (moderate-intensity exercise). Do strengthening exercises at least twice a week. This is in addition to the moderate-intensity exercise. Spend less time sitting. Even light physical activity can be beneficial. Watch cholesterol and blood lipids Have your blood tested for lipids and cholesterol at 46 years of age, then have this test every 5 years. You may need to have your cholesterol levels checked more often if: Your lipid or cholesterol levels are high. You are older than 46 years of age. You are at high risk for heart disease. What should I know about cancer screening? Many types of cancers can be detected early and may often be prevented. Depending on your health history and family history, you may need to have cancer screening at various ages. This may include screening for: Colorectal cancer. Prostate cancer. Skin cancer. Lung  cancer. What should I know about heart disease, diabetes, and high blood pressure? Blood pressure and heart disease High blood pressure causes heart disease and increases the risk of stroke. This is more likely to develop in people who have high blood pressure readings or are overweight. Talk with your health care provider about your target blood pressure readings. Have your blood pressure checked: Every 3-5 years if you are 18-39 years of age. Every year if you are 40 years old or older. If you are between the ages of 65 and 75 and are a current or former smoker, ask your health care provider if you should have a one-time screening for abdominal aortic aneurysm (AAA). Diabetes Have regular diabetes screenings. This checks your fasting blood sugar level. Have the screening done: Once every three years after age 45 if you are at a normal weight and have a low risk for diabetes. More often and at a younger age if you are overweight or have a high risk for diabetes. What should I know about preventing infection? Hepatitis B If you have a higher risk for hepatitis B, you should be screened for this virus. Talk with your health care provider to find out if you are at risk for hepatitis B infection. Hepatitis C Blood testing is recommended for: Everyone born from 1945 through 1965. Anyone with known risk factors for hepatitis C. Sexually transmitted infections (STIs) You should be screened each year for STIs, including gonorrhea and chlamydia, if: You are sexually active and are younger than 46 years of age. You are older than 46 years of age and your   health care provider tells you that you are at risk for this type of infection. Your sexual activity has changed since you were last screened, and you are at increased risk for chlamydia or gonorrhea. Ask your health care provider if you are at risk. Ask your health care provider about whether you are at high risk for HIV. Your health care provider  may recommend a prescription medicine to help prevent HIV infection. If you choose to take medicine to prevent HIV, you should first get tested for HIV. You should then be tested every 3 months for as long as you are taking the medicine. Follow these instructions at home: Alcohol use Do not drink alcohol if your health care provider tells you not to drink. If you drink alcohol: Limit how much you have to 0-2 drinks a day. Know how much alcohol is in your drink. In the [place] one drink equals one 12 oz bottle of beer (355 mL), one 5 oz glass of wine (148 mL), or one 1 oz glass of hard liquor (44 mL). Lifestyle Do not use any products that contain nicotine or tobacco. These products include cigarettes, chewing tobacco, and vaping devices, such as e-cigarettes. If you need help quitting, ask your health care provider. Do not use street drugs. Do not share needles. Ask your health care provider for help if you need support or information about quitting drugs. General instructions Schedule regular health, dental, and eye exams. Stay current with your vaccines. Tell your health care provider if: You often feel depressed. You have ever been abused or do not feel safe at home. Summary Adopting a healthy lifestyle and getting preventive care are important in promoting health and wellness. Follow your health care provider's instructions about healthy diet, exercising, and getting tested or screened for diseases. Follow your health care provider's instructions on monitoring your cholesterol and blood pressure. This information is not intended to replace advice given to you by your health care provider. Make sure you discuss any questions you have with your health care provider. Document Revised: 04/12/2021 Document Reviewed: 04/12/2021 Elsevier Patient Education  2024 Elsevier Inc.  

## 2023-09-12 NOTE — Progress Notes (Signed)
Complete physical exam  Patient: Brian Rowe   DOB: [birth year]   46 y.o. Male  MRN: [id number]  Subjective:    Chief Complaint  Patient presents with   Annual Exam    Brian Rowe is a 47 y.o. male who presents today for a complete physical exam. He reports consuming a general diet. Gym/ health club routine includes basketball. He generally feels well. He reports sleeping fairly well, sees BH and takes medication to sleep at night. He does not have additional problems to discuss today.    Most recent fall risk assessment:    07/28/2023    9:04 AM  Fall Risk   Falls in the past year? 0  Number falls in past yr: 0  Injury with Fall? 0  Risk for fall due to : No Fall Risks  Follow up Falls prevention discussed     Most recent depression screenings:    07/28/2023    9:02 AM 05/17/2023    3:15 PM  PHQ 2/9 Scores  PHQ - 2 Score 0   Exception Documentation  Patient refusal    Vision:Within last year and Dental: No current dental problems and Receives regular dental care  Patient Active Problem List   Diagnosis Date Noted   Balance problem 10/26/2022   Acute otitis media with effusion of right ear 07/07/2022   MDD (major depressive disorder) 04/15/2020   Slurred speech 04/05/2020   ADD (attention deficit disorder) 01/07/2019   Bilateral carpal tunnel syndrome 10/13/2015   Hypothyroidism 05/29/2015   Degenerative arthritis of left ankle 12/30/2014   Tendonitis, Achilles, right 12/30/2014   Thoracic back pain 07/23/2012   HEMORRHOIDS, INTERNAL 12/01/2008   VISUAL IMPAIRMENT 10/15/2007   HEMATURIA 10/03/2007   Dyslipidemia 07/19/2007   Anxiety 07/19/2007   DEPRESSION 07/19/2007   LOW BACK PAIN 07/19/2007      Patient Care Team: Karie Georges, MD as PCP - General (Family Medicine)   Outpatient Medications Prior to Visit  Medication Sig   alprazolam Prudy Feeler) 2 MG tablet Take 2 mg by mouth in the morning and at bedtime.   amphetamine-dextroamphetamine  (ADDERALL) 10 MG tablet Take 10 mg by mouth every morning.   clonazePAM (KLONOPIN) 2 MG tablet Take 2 mg by mouth 3 (three) times daily as needed.   cyclobenzaprine (FLEXERIL) 10 MG tablet Take 10 mg by mouth 3 (three) times daily as needed.   Eszopiclone 3 MG TABS Take 3 mg by mouth at bedtime.   hydroxypropyl methylcellulose / hypromellose (ISOPTO TEARS / GONIOVISC) 2.5 % ophthalmic solution Place 1 drop into the right eye 4 (four) times daily.   levothyroxine (SYNTHROID) 112 MCG tablet TAKE 1 TABLET BY MOUTH DAILY BEFORE BREAKFAST    meloxicam (MOBIC) 15 MG tablet Take 1 tablet (15 mg total) by mouth daily.   Multiple Vitamins-Minerals (MULTIVITAMIN ADULTS PO) Take by mouth.   oxyCODONE-acetaminophen (PERCOCET) 10-325 MG tablet Take 1 tablet by mouth every 4 (four) hours as needed for pain.   No facility-administered medications prior to visit.    Review of Systems  HENT:  Negative for hearing loss.   Eyes:  Negative for blurred vision.  Respiratory:  Negative for shortness of breath.   Cardiovascular:  Negative for chest pain.  Gastrointestinal: Negative.   Genitourinary: Negative.   Musculoskeletal:  Negative for back pain.  Neurological:  Negative for headaches.  Psychiatric/Behavioral:  Negative for depression.   All other systems reviewed and are negative.      Objective:  BP 112/82 (BP Location: Right Arm, Patient Position: Sitting, Cuff Size: Large)   Pulse 75   Temp 98.2 F (36.8 C) (Oral)   Ht 6' 3.5" (1.918 m)   Wt 297 lb 9.6 oz (135 kg)   SpO2 98%   BMI 36.71 kg/m    Physical Exam Vitals reviewed.  Constitutional:      Appearance: Normal appearance. He is well-groomed. He is obese.  HENT:     Right Ear: Tympanic membrane and ear canal normal.     Left Ear: Tympanic membrane and ear canal normal.     Mouth/Throat:     Mouth: Mucous membranes are moist.     Pharynx: No posterior oropharyngeal erythema.  Eyes:     Extraocular Movements: Extraocular  movements intact.     Conjunctiva/sclera: Conjunctivae normal.  Neck:     Thyroid: No thyromegaly.  Cardiovascular:     Rate and Rhythm: Normal rate and regular rhythm.     Heart sounds: S1 normal and S2 normal. No murmur heard. Pulmonary:     Effort: Pulmonary effort is normal.     Breath sounds: Normal breath sounds and air entry. No rales.  Abdominal:     General: Abdomen is flat. Bowel sounds are normal.  Musculoskeletal:     Right lower leg: No edema.     Left lower leg: No edema.  Lymphadenopathy:     Cervical: No cervical adenopathy.  Neurological:     General: No focal deficit present.     Mental Status: He is alert and oriented to person, place, and time.     Gait: Gait is intact.  Psychiatric:        Mood and Affect: Mood and affect normal.      No results found for any visits on 09/12/23.     Assessment & Plan:    Routine Health Maintenance and Physical Exam  Immunization History  Administered Date(s) Administered   Influenza Inj Mdck Quad With Preservative 10/19/2017   Influenza Split 09/02/2013   Influenza, Seasonal, Injecte, Preservative Fre 09/12/2023   Influenza,inj,Quad PF,6+ Mos 08/29/2016, 09/05/2022   Influenza-Unspecified 08/06/2014, 12/19/2017, 09/04/2018, 08/25/2021   Moderna Sars-Covid-2 Vaccination 03/10/2020, 04/08/2020, 08/25/2021   Td 12/05/2002   Tdap 08/04/2012    Health Maintenance  Topic Date Due   DTaP/Tdap/Td (3 - Td or Tdap) 08/04/2022   Colonoscopy  Never done   COVID-19 Vaccine (4 - 2023-24 season) 09/28/2023 (Originally 08/06/2023)   Medicare Annual Wellness (AWV)  07/27/2024   INFLUENZA VACCINE  Completed   Hepatitis C Screening  Completed   HIV Screening  Completed   HPV VACCINES  Aged Out    Discussed health benefits of physical activity, and encouraged him to engage in regular exercise appropriate for his age and condition.  Need for immunization against influenza -     Flu vaccine trivalent PF, 6mos and  older(Flulaval,Afluria,Fluarix,Fluzone)  Acquired hypothyroidism -     CBC with Differential/Platelet; Future -     Comprehensive metabolic panel; Future -     TSH; Future  Lipid screening -     Lipid panel; Future  Routine general medical examination at a health care facility  Normal physical exam findings today except for obesity. I have ordered his annual labs for surveillance. Counseled patient on healthy eating and exercise, handouts given.   Return in 1 year (on 09/11/2024) for annual physical exam.     Karie Georges, MD

## 2023-09-13 ENCOUNTER — Other Ambulatory Visit: Payer: Medicare Other

## 2023-09-14 ENCOUNTER — Encounter: Payer: Self-pay | Admitting: Family Medicine

## 2023-09-14 DIAGNOSIS — E039 Hypothyroidism, unspecified: Secondary | ICD-10-CM

## 2023-09-15 ENCOUNTER — Other Ambulatory Visit (INDEPENDENT_AMBULATORY_CARE_PROVIDER_SITE_OTHER): Payer: Medicare Other

## 2023-09-15 DIAGNOSIS — E039 Hypothyroidism, unspecified: Secondary | ICD-10-CM | POA: Diagnosis not present

## 2023-09-15 DIAGNOSIS — Z13220 Encounter for screening for lipoid disorders: Secondary | ICD-10-CM | POA: Diagnosis not present

## 2023-09-15 LAB — LIPID PANEL
Cholesterol: 164 mg/dL (ref 0–200)
HDL: 30.8 mg/dL — ABNORMAL LOW
LDL Cholesterol: 99 mg/dL (ref 0–99)
NonHDL: 133.59
Total CHOL/HDL Ratio: 5
Triglycerides: 172 mg/dL — ABNORMAL HIGH (ref 0.0–149.0)
VLDL: 34.4 mg/dL (ref 0.0–40.0)

## 2023-09-15 LAB — COMPREHENSIVE METABOLIC PANEL
ALT: 20 U/L (ref 0–53)
AST: 14 U/L (ref 0–37)
Albumin: 4 g/dL (ref 3.5–5.2)
Alkaline Phosphatase: 45 U/L (ref 39–117)
BUN: 18 mg/dL (ref 6–23)
CO2: 29 meq/L (ref 19–32)
Calcium: 8.9 mg/dL (ref 8.4–10.5)
Chloride: 106 meq/L (ref 96–112)
Creatinine, Ser: 1.06 mg/dL (ref 0.40–1.50)
GFR: 84.58 mL/min (ref >60.00)
Glucose, Bld: 84 mg/dL (ref 70–99)
Potassium: 4.3 meq/L (ref 3.5–5.1)
Sodium: 141 meq/L (ref 135–145)
Total Bilirubin: 0.6 mg/dL (ref 0.2–1.2)
Total Protein: 6.4 g/dL (ref 6.0–8.3)

## 2023-09-15 LAB — CBC WITH DIFFERENTIAL/PLATELET
Basophils Absolute: 0 K/uL (ref 0.0–0.1)
Basophils Relative: 0.7 % (ref 0.0–3.0)
Eosinophils Absolute: 0.3 K/uL (ref 0.0–0.7)
Eosinophils Relative: 6 % — ABNORMAL HIGH (ref 0.0–5.0)
HCT: 46.3 % (ref 39.0–52.0)
Hemoglobin: 15.6 g/dL (ref 13.0–17.0)
Lymphocytes Relative: 50.1 % — ABNORMAL HIGH (ref 12.0–46.0)
Lymphs Abs: 2.6 K/uL (ref 0.7–4.0)
MCHC: 33.6 g/dL (ref 30.0–36.0)
MCV: 93.2 fl (ref 78.0–100.0)
Monocytes Absolute: 0.4 K/uL (ref 0.1–1.0)
Monocytes Relative: 7.4 % (ref 3.0–12.0)
Neutro Abs: 1.8 K/uL (ref 1.4–7.7)
Neutrophils Relative %: 35.8 % — ABNORMAL LOW (ref 43.0–77.0)
Platelets: 206 K/uL (ref 150.0–400.0)
RBC: 4.97 Mil/uL (ref 4.22–5.81)
RDW: 13 % (ref 11.5–15.5)
WBC: 5.1 K/uL (ref 4.0–10.5)

## 2023-09-15 LAB — TSH: TSH: 7.27 u[IU]/mL — ABNORMAL HIGH (ref 0.35–5.50)

## 2023-09-15 MED ORDER — LEVOTHYROXINE SODIUM 125 MCG PO TABS
125.0000 ug | ORAL_TABLET | Freq: Every day | ORAL | 1 refills | Status: DC
Start: 2023-09-15 — End: 2023-09-19

## 2023-09-15 NOTE — Addendum Note (Signed)
Addended by: Karie Georges on: 09/15/2023 02:23 PM   Modules accepted: Orders

## 2023-09-25 ENCOUNTER — Ambulatory Visit (AMBULATORY_SURGERY_CENTER): Payer: Medicare Other

## 2023-09-25 VITALS — Ht 75.5 in | Wt 300.0 lb

## 2023-09-25 DIAGNOSIS — Z1211 Encounter for screening for malignant neoplasm of colon: Secondary | ICD-10-CM

## 2023-09-25 MED ORDER — PEG 3350-KCL-NA BICARB-NACL 420 G PO SOLR
4000.0000 mL | Freq: Once | ORAL | 0 refills | Status: AC
Start: 2023-09-25 — End: 2023-09-25

## 2023-09-25 NOTE — Progress Notes (Signed)

## 2023-10-02 ENCOUNTER — Encounter: Payer: Medicare Other | Admitting: Internal Medicine

## 2023-10-04 ENCOUNTER — Other Ambulatory Visit: Payer: Self-pay | Admitting: Family Medicine

## 2023-10-04 DIAGNOSIS — E039 Hypothyroidism, unspecified: Secondary | ICD-10-CM

## 2023-10-06 ENCOUNTER — Encounter: Payer: Self-pay | Admitting: Internal Medicine

## 2023-10-06 ENCOUNTER — Ambulatory Visit (AMBULATORY_SURGERY_CENTER): Payer: Medicare Other | Admitting: Internal Medicine

## 2023-10-06 VITALS — BP 122/74 | HR 67 | Temp 97.3°F | Resp 15 | Ht 75.5 in | Wt 300.0 lb

## 2023-10-06 DIAGNOSIS — Z1211 Encounter for screening for malignant neoplasm of colon: Secondary | ICD-10-CM

## 2023-10-06 MED ORDER — SODIUM CHLORIDE 0.9 % IV SOLN: 500.0000 mL | Freq: Once | INTRAVENOUS | Status: DC

## 2023-10-06 NOTE — Op Note (Signed)
[facility] Patient Name: Brian Rowe Procedure Date: 10/06/2023 1:59 PM MRN: [id number] Endoscopist: Wilhemina Bonito. Marina Goodell , MD, [id number] Age: 47 Referring MD:  Date of Birth: [birth year] Gender: Male Account #: [id number] Procedure:                Colonoscopy Indications:              Screening for colorectal malignant neoplasm Medicines:                Monitored Anesthesia Care Procedure:                Pre-Anesthesia Assessment:                           - Prior to the procedure, a History and Physical                            was performed, and patient medications and                            allergies were reviewed. The patient's tolerance of                            previous anesthesia was also reviewed. The risks                            and benefits of the procedure and the sedation                            options and risks were discussed with the patient.                            All questions were answered, and informed consent                            was obtained. Prior Anticoagulants: The patient has                            taken no anticoagulant or antiplatelet agents. ASA                            Grade Assessment: II - A patient with mild systemic                            disease. After reviewing the risks and benefits,                            the patient was deemed in satisfactory condition to                            undergo the procedure.                           After obtaining informed consent, the colonoscope  was passed under direct vision. Throughout the                            procedure, the patient's blood pressure, pulse, and                            oxygen saturations were monitored continuously. The                            CF [id number] #[id number] was introduced through the anus                            and advanced to the the cecum, identified by                            appendiceal orifice  and ileocecal valve. The                            ileocecal valve, appendiceal orifice, and rectum                            were photographed. The quality of the bowel                            preparation was good. The colonoscopy was performed                            without difficulty. The patient tolerated the                            procedure well. The bowel preparation used was                            SUPREP via split dose instruction. Scope In: 2:01:59 PM Scope Out: 2:20:33 PM Scope Withdrawal Time: 0 hours 9 minutes 40 seconds  Total Procedure Duration: 0 hours 18 minutes 34 seconds  Findings:                 Scattered diverticula were found in the left colon.                           Internal hemorrhoids were found during retroflexion.                           The exam was otherwise without abnormality on                            direct and retroflexion views. Complications:            No immediate complications. Estimated blood loss:                            None. Estimated Blood Loss:     Estimated blood loss: none. Impression:               -  Diverticulosis in the left colon.                           - Internal hemorrhoids.                           - The examination was otherwise normal on direct                            and retroflexion views.                           - No specimens collected. Recommendation:           - Repeat colonoscopy in 10 years for screening                            purposes.                           - Patient has a contact number available for                            emergencies. The signs and symptoms of potential                            delayed complications were discussed with the                            patient. Return to normal activities tomorrow.                            Written discharge instructions were provided to the                            patient.                           - Resume previous  diet.                           - Continue present medications. Wilhemina Bonito. Marina Goodell, MD 10/06/2023 2:23:31 PM This report has been signed electronically.

## 2023-10-06 NOTE — Progress Notes (Signed)
Sedate, gd SR, tolerated procedure well, VSS, report to RN 

## 2023-10-06 NOTE — Progress Notes (Signed)
HISTORY OF PRESENT ILLNESS:  Brian Rowe is a 47 y.o. male who sent today for screening colonoscopy.  No complaints  REVIEW OF SYSTEMS:  All non-GI ROS negative except for  Past Medical History:  Diagnosis Date   AKI (acute kidney injury) (HCC) 04/05/2020   ANXIETY 07/19/2007   Arthritis    DEPRESSION 07/19/2007   HEMORRHOIDS, INTERNAL 12/01/2008   HYPERLIPIDEMIA 07/19/2007   Sleep apnea     Past Surgical History:  Procedure Laterality Date   INGUINAL HERNIA REPAIR Right    left ankle Left    NASAL SEPTUM SURGERY     TONSILLECTOMY     UVULOPALATOPHARYNGOPLASTY (UPPP)/TONSILLECTOMY/SEPTOPLASTY      Social History Brian Rowe  reports that he quit smoking about 11 years ago. His smoking use included cigarettes. He has never used smokeless tobacco. He reports that he does not currently use alcohol after a past usage of about 1.0 standard drink of alcohol per week. He reports that he does not use drugs.  family history includes Alzheimer's disease in his maternal grandmother and paternal grandmother; Anxiety disorder in his father; Hypertension in his father; Obesity in his mother; Pancreatic cancer in his paternal grandfather; Prostate cancer in his maternal uncle; Rheum arthritis in his sister; Sleep apnea in his mother; Stroke in his maternal grandfather.  Allergies  Allergen Reactions   Tramadol     ?serotonin syndrome       PHYSICAL EXAMINATION: Vital signs: BP 125/87   Pulse 67   Temp (!) 97.3 F (36.3 C)   Ht 6' 3.5" (1.918 m)   Wt 300 lb (136.1 kg)   SpO2 96%   BMI 37.00 kg/m  General: Well-developed, well-nourished, no acute distress HEENT: Sclerae are anicteric, conjunctiva pink. Oral mucosa intact Lungs: Clear Heart: Regular Abdomen: soft, nontender, nondistended, no obvious ascites, no peritoneal signs, normal bowel sounds. No organomegaly. Extremities: No edema Psychiatric: alert and oriented x3. Cooperative     ASSESSMENT:  Colon  cancer screening   PLAN:   Screening colonoscopy

## 2023-10-06 NOTE — Patient Instructions (Signed)
Resume previous diet.  Continue present medications.     YOU HAD AN ENDOSCOPIC PROCEDURE TODAY AT THE [facility]:   Refer to the procedure report that was given to you for any specific questions about what was found during the examination.  If the procedure report does not answer your questions, please call your gastroenterologist to clarify.  If you requested that your care partner not be given the details of your procedure findings, then the procedure report has been included in a sealed envelope for you to review at your convenience later.  YOU SHOULD EXPECT: Some feelings of bloating in the abdomen. Passage of more gas than usual.  Walking can help get rid of the air that was put into your GI tract during the procedure and reduce the bloating. If you had a lower endoscopy (such as a colonoscopy or flexible sigmoidoscopy) you may notice spotting of blood in your stool or on the toilet paper. If you underwent a bowel prep for your procedure, you may not have a normal bowel movement for a few days.  Please Note:  You might notice some irritation and congestion in your nose or some drainage.  This is from the oxygen used during your procedure.  There is no need for concern and it should clear up in a day or so.  SYMPTOMS TO REPORT IMMEDIATELY:  Following lower endoscopy (colonoscopy or flexible sigmoidoscopy):  Excessive amounts of blood in the stool  Significant tenderness or worsening of abdominal pains  Swelling of the abdomen that is new, acute  Fever of 100F or higher   For urgent or emergent issues, a gastroenterologist can be reached at any hour by calling [phone]. Do not use MyChart messaging for urgent concerns.    DIET:  We do recommend a small meal at first, but then you may proceed to your regular diet.  Drink plenty of fluids but you should avoid alcoholic beverages for 24 hours.  ACTIVITY:  You should plan to take it easy for the rest of today and you  should NOT DRIVE or use heavy machinery until tomorrow (because of the sedation medicines used during the test).    FOLLOW UP: Our staff will call the number listed on your records the next business day following your procedure.  We will call around 7:15- 8:00 am to check on you and address any questions or concerns that you may have regarding the information given to you following your procedure. If we do not reach you, we will leave a message.     If any biopsies were taken you will be contacted by phone or by letter within the next 1-3 weeks.  Please call us at [phone] if you have not heard about the biopsies in 3 weeks.    SIGNATURES/CONFIDENTIALITY: You and/or your care partner have signed paperwork which will be entered into your electronic medical record.  These signatures attest to the fact that that the information above on your After Visit Summary has been reviewed and is understood.  Full responsibility of the confidentiality of this discharge information lies with you and/or your care-partner.

## 2023-10-09 ENCOUNTER — Telehealth: Payer: Self-pay

## 2023-10-09 ENCOUNTER — Other Ambulatory Visit: Payer: Self-pay | Admitting: Family Medicine

## 2023-10-09 DIAGNOSIS — E039 Hypothyroidism, unspecified: Secondary | ICD-10-CM

## 2023-10-09 NOTE — Telephone Encounter (Signed)
  Follow up Call-     10/06/2023    1:27 PM  Call back number  Post procedure Call Back phone  # [phone]  Permission to leave phone message Yes     Patient questions:  Do you have a fever, pain , or abdominal swelling? No. Pain Score  0 *  Have you tolerated food without any problems? Yes.    Have you been able to return to your normal activities? Yes.    Do you have any questions about your discharge instructions: Diet   No. Medications  No. Follow up visit  No.  Do you have questions or concerns about your Care? No.  Actions: * If pain score is 4 or above: No action needed, pain <4.

## 2024-01-23 ENCOUNTER — Ambulatory Visit (HOSPITAL_COMMUNITY)
Admission: EM | Admit: 2024-01-23 | Discharge: 2024-01-23 | Discharge status: Against Medical Advice | Payer: Medicare (Managed Care)
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
## 2024-01-27 ENCOUNTER — Other Ambulatory Visit: Payer: Self-pay | Admitting: Family Medicine

## 2024-01-27 DIAGNOSIS — G8929 Other chronic pain: Secondary | ICD-10-CM

## 2024-01-30 ENCOUNTER — Ambulatory Visit (HOSPITAL_COMMUNITY)
Admission: EM | Admit: 2024-01-30 | Discharge: 2024-01-30 | Discharge status: Against Medical Advice | Payer: Medicare (Managed Care)
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
## 2024-01-30 NOTE — ED Notes (Signed)
 Per triage, pt left AMA.

## 2024-03-08 ENCOUNTER — Other Ambulatory Visit: Payer: Self-pay | Admitting: Family Medicine

## 2024-03-08 DIAGNOSIS — E039 Hypothyroidism, unspecified: Secondary | ICD-10-CM

## 2024-03-10 NOTE — Telephone Encounter (Signed)
 Pt needs a new TSH level, please contact patient to schedule a 6 month follow up with me in the office. Then ok to refill this rx.

## 2024-03-11 ENCOUNTER — Other Ambulatory Visit: Payer: Self-pay | Admitting: Family Medicine

## 2024-03-11 ENCOUNTER — Encounter: Payer: Self-pay | Admitting: *Deleted

## 2024-03-11 DIAGNOSIS — E039 Hypothyroidism, unspecified: Secondary | ICD-10-CM

## 2024-03-11 MED ORDER — LEVOTHYROXINE SODIUM 125 MCG PO TABS: 125.0000 ug | ORAL_TABLET | Freq: Every day | ORAL | 0 refills | Status: AC

## 2024-04-01 ENCOUNTER — Emergency Department (HOSPITAL_BASED_OUTPATIENT_CLINIC_OR_DEPARTMENT_OTHER)
Admission: EM | Admit: 2024-04-01 | Discharge: 2024-04-01 | Disposition: A | Discharge status: Home / Self Care | Payer: Medicare (Managed Care) | Attending: Emergency Medicine | Admitting: Emergency Medicine

## 2024-04-01 ENCOUNTER — Encounter (HOSPITAL_BASED_OUTPATIENT_CLINIC_OR_DEPARTMENT_OTHER): Payer: Self-pay

## 2024-04-01 ENCOUNTER — Other Ambulatory Visit: Payer: Self-pay

## 2024-04-01 DIAGNOSIS — Z87891 Personal history of nicotine dependence: Secondary | ICD-10-CM | POA: Diagnosis not present

## 2024-04-01 DIAGNOSIS — F32A Depression, unspecified: Secondary | ICD-10-CM | POA: Diagnosis present

## 2024-04-01 NOTE — Discharge Instructions (Addendum)
 As discussed, I have sent a referral for psychiatry as well as attention information to your discharge papers.  If you are actively become suicidal, worsening depression acutely before you are able to be seen by psychiatry in the outpatient setting, please go to behavioral urgent care.

## 2024-04-01 NOTE — ED Provider Notes (Signed)
 [place] EMERGENCY DEPARTMENT AT [facility] Provider Note   CSN: [id number] Arrival date & time: 04/01/24  1235     History  Chief Complaint  Patient presents with   Mental Health Problem    Brian Rowe is a 48 y.o. male.   Mental Health Problem   48 year old male presents to the emergency department with complaints of increased depression.  States that he feels like "I am just there."  States that he deals passively with thoughts of "not wanting to be here" but has no active plan/intention to harm himself.  Does report recently beginning Zoloft  as well as Cymbalta over the past month which he states has not been helping his symptoms.  Denies any auditory/visual hallucinations, homicidal ideation.  States that he would like a referral to a new psychiatrist as he has lost faith with someone that he has currently.  States he would not like to get TTS involved given a bad experience at behavioral Hospital in the past.  Denies any chest pain, shortness of breath abdominal pain, nausea vomit, urinary symptoms, change in bowel habits.  Denies any substance use.  Past medical history significant for hyperlipidemia, anxiety, depression, OSA  Home Medications Prior to Admission medications   Medication Sig Start Date End Date Taking? Authorizing Provider  alprazolam  (XANAX ) 2 MG tablet Take 2 mg by mouth in the morning and at bedtime. 07/21/22   [provider]  amphetamine-dextroamphetamine (ADDERALL) 10 MG tablet Take 10 mg by mouth every morning. 04/27/23   [provider]  clonazePAM (KLONOPIN) 2 MG tablet Take 2 mg by mouth 3 (three) times daily as needed. Patient not taking: Reported on 09/25/2023 09/06/23   [provider]  cyclobenzaprine  (FLEXERIL ) 10 MG tablet Take 10 mg by mouth 3 (three) times daily as needed. 12/16/22   [provider]  levothyroxine  (SYNTHROID ) 125 MCG tablet Take 1 tablet (125 mcg total) by mouth daily. 03/11/24    Aida House, MD  meloxicam  (MOBIC ) 15 MG tablet TAKE 1 TABLET BY MOUTH DAILY 01/29/24   Aida House, MD  Multiple Vitamins-Minerals (MULTIVITAMIN ADULTS PO) Take by mouth.    [provider]  oxyCODONE -acetaminophen  (PERCOCET) 10-325 MG tablet Take 1 tablet by mouth every 4 (four) hours as needed for pain.    [provider]  zolpidem (AMBIEN) 10 MG tablet Take 10 mg by mouth at bedtime. 09/11/23   [provider]      Allergies    Tramadol     Review of Systems   Review of Systems  All other systems reviewed and are negative.   Physical Exam Updated Vital Signs BP (!) 140/94   Pulse 81   Temp 98.2 F (36.8 C)   Resp 16   SpO2 98%  Physical Exam Vitals and nursing note reviewed.  Constitutional:      General: He is not in acute distress.    Appearance: He is well-developed.  HENT:     Head: Normocephalic and atraumatic.  Eyes:     Conjunctiva/sclera: Conjunctivae normal.  Cardiovascular:     Rate and Rhythm: Normal rate and regular rhythm.     Heart sounds: No murmur heard. Pulmonary:     Effort: Pulmonary effort is normal. No respiratory distress.     Breath sounds: Normal breath sounds.  Abdominal:     Palpations: Abdomen is soft.     Tenderness: There is no abdominal tenderness.  Musculoskeletal:        General: No  swelling.     Cervical back: Neck supple.  Skin:    General: Skin is warm and dry.     Capillary Refill: Capillary refill takes less than 2 seconds.  Neurological:     Mental Status: He is alert.  Psychiatric:        Mood and Affect: Mood is depressed. Affect is blunt.        Speech: Speech normal.        Behavior: Behavior normal. Behavior is cooperative.        Thought Content: Thought content normal.     ED Results / Procedures / Treatments   Labs (all labs ordered are listed, but only abnormal results are displayed) Labs Reviewed - No data to display  EKG None  Radiology No results  found.  Procedures Procedures    Medications Ordered in ED Medications - No data to display  ED Course/ Medical Decision Making/ A&P                                 Medical Decision Making  This patient presents to the ED for concern of depression, this involves an extensive number of treatment options, and is a complaint that carries with it a high risk of complications and morbidity.  The differential diagnosis includes depression, medication side effect, substance use/withdrawal, other   Co morbidities that complicate the patient evaluation  See HPI   Additional history obtained:  Additional history obtained from EMR External records from outside source obtained and reviewed including hospital records   Lab Tests:  Patient declined  Imaging Studies ordered:  N/a   Cardiac Monitoring: / EKG:  N/a   Consultations Obtained:  Patient declined   Problem List / ED Course / Critical interventions / Medication management  Depression Reevaluation of the patient showed that the patient stayed the same I have reviewed the patients home medicines and have made adjustments as needed   Social Determinants of Health:  Former cigarette use.  Denies illicit drug use.   Test / Admission - Considered:  Depression Vitals signs significant for htn bp 140/94. Otherwise within normal range and stable throughout visit. 48 year old male presents to the emergency department with complaints of increased depression.  States that he feels like "I am just there."  States that he deals passively with thoughts of "not wanting to be here" but has no active plan/intention to harm himself.  Does report recently beginning Zoloft  as well as Cymbalta over the past month which he states has not been helping his symptoms.  Denies any auditory/visual hallucinations, homicidal ideation.  States that he would like a referral to a new psychiatrist as he has lost faith with someone that he has  currently.  States he would not like to get TTS involved given a bad experience at behavioral Hospital in the past.  Denies any chest pain, shortness of breath abdominal pain, nausea vomit, urinary symptoms, change in bowel habits.  Denies any substance use. On exam, patient with depressed mood as well as blunted affect.  No other appreciable abnormality on PE.  Shared decision-making conversation was had with patient regarding obtaining labs with TTS consultation with patient repeatedly declined due to bad experience he had with providers at the [facility] in the past.  Patient not actively suicidal with no plan or intent; patient deemed not not harmful to self or others.  Mother states she lives very close to  the patient and checks on him frequently.  Patient and mother mainly requesting new psychiatrist.  Will send psychiatric referral and recommend returning to behavioral hospital if patient becomes acutely suicidal with worsening depression or other abnormality discussed.  Treatment plan discussed with patient and mother and they are in understanding were agreeable to said plan.  Patient overall well-appearing, afebrile in no acute distress. Worrisome signs and symptoms were discussed with the patient, and the patient acknowledged understanding to return to the ED if noticed. Patient was stable upon discharge.          Final Clinical Impression(s) / ED Diagnoses Final diagnoses:  Depression, unspecified depression type    Rx / DC Orders ED Discharge Orders          Ordered    Ambulatory referral to Psychiatry        04/01/24 1428              [place], [place] 04/01/24 1441    Guadalupe Lee, MD 04/02/24 [phone]

## 2024-04-01 NOTE — ED Triage Notes (Signed)
 Pt c/o depression, associated suicidal thoughts w no intent/ plan. States he has psychiatrist, prescription antidepressant but advises he feels they are not working. Pt denies SI/HI in triage, mother w pt, supportive.

## 2024-04-17 ENCOUNTER — Ambulatory Visit (HOSPITAL_COMMUNITY): Payer: Medicare (Managed Care) | Admitting: Family

## 2024-12-23 ENCOUNTER — Other Ambulatory Visit: Payer: Self-pay | Admitting: Family Medicine

## 2024-12-23 DIAGNOSIS — M545 Low back pain, unspecified: Secondary | ICD-10-CM

## 2024-12-24 NOTE — Telephone Encounter (Signed)
>  1 year since last appt, please refuse rx and have patient schedule.

## 2024-12-31 ENCOUNTER — Ambulatory Visit: Payer: Medicare (Managed Care) | Admitting: Gastroenterology

## 2025-01-27 ENCOUNTER — Ambulatory Visit: Payer: Medicare (Managed Care) | Admitting: Gastroenterology
